# Patient Record
Sex: Female | Born: 1935 | Race: White | Hispanic: No | Smoking: Former smoker
Health system: Southern US, Community
[De-identification: ages and names within clinical notes are randomized; demographics above are authoritative.]

## PROBLEM LIST (undated history)

## (undated) DIAGNOSIS — E559 Vitamin D deficiency, unspecified: Secondary | ICD-10-CM

## (undated) DIAGNOSIS — I509 Heart failure, unspecified: Secondary | ICD-10-CM

## (undated) DIAGNOSIS — I10 Essential (primary) hypertension: Secondary | ICD-10-CM

## (undated) DIAGNOSIS — R609 Edema, unspecified: Secondary | ICD-10-CM

## (undated) DIAGNOSIS — N3281 Overactive bladder: Secondary | ICD-10-CM

## (undated) DIAGNOSIS — E119 Type 2 diabetes mellitus without complications: Secondary | ICD-10-CM

## (undated) DIAGNOSIS — M810 Age-related osteoporosis without current pathological fracture: Secondary | ICD-10-CM

## (undated) DIAGNOSIS — F0390 Unspecified dementia without behavioral disturbance: Secondary | ICD-10-CM

## (undated) DIAGNOSIS — Z9289 Personal history of other medical treatment: Secondary | ICD-10-CM

## (undated) DIAGNOSIS — E785 Hyperlipidemia, unspecified: Secondary | ICD-10-CM

## (undated) HISTORY — DX: Hyperlipidemia, unspecified: E78.5

## (undated) HISTORY — DX: Overactive bladder: N32.81

## (undated) HISTORY — DX: Essential (primary) hypertension: I10

## (undated) HISTORY — DX: Personal history of other medical treatment: Z92.89

## (undated) HISTORY — DX: Vitamin D deficiency, unspecified: E55.9

## (undated) HISTORY — DX: Age-related osteoporosis without current pathological fracture: M81.0

## (undated) HISTORY — DX: Edema, unspecified: R60.9

---

## 1973-09-30 HISTORY — PX: ABDOMINAL HYSTERECTOMY: SHX81

## 1998-08-29 ENCOUNTER — Ambulatory Visit (HOSPITAL_COMMUNITY): Admission: RE | Admit: 1998-08-29 | Discharge: 1998-08-29 | Payer: Self-pay | Admitting: Obstetrics & Gynecology

## 2000-01-28 ENCOUNTER — Encounter: Payer: Self-pay | Admitting: Family Medicine

## 2000-01-28 ENCOUNTER — Encounter: Admission: RE | Admit: 2000-01-28 | Discharge: 2000-01-28 | Payer: Self-pay | Admitting: Family Medicine

## 2001-03-03 ENCOUNTER — Encounter: Admission: RE | Admit: 2001-03-03 | Discharge: 2001-03-03 | Payer: Self-pay | Admitting: Family Medicine

## 2001-03-03 ENCOUNTER — Encounter: Payer: Self-pay | Admitting: Family Medicine

## 2001-03-24 ENCOUNTER — Other Ambulatory Visit: Admission: RE | Admit: 2001-03-24 | Discharge: 2001-03-24 | Payer: Self-pay | Admitting: Family Medicine

## 2002-04-06 ENCOUNTER — Other Ambulatory Visit: Admission: RE | Admit: 2002-04-06 | Discharge: 2002-04-06 | Payer: Self-pay | Admitting: Family Medicine

## 2002-10-12 ENCOUNTER — Encounter: Admission: RE | Admit: 2002-10-12 | Discharge: 2002-10-12 | Payer: Self-pay | Admitting: Family Medicine

## 2002-10-12 ENCOUNTER — Encounter: Payer: Self-pay | Admitting: Family Medicine

## 2003-04-28 ENCOUNTER — Other Ambulatory Visit: Admission: RE | Admit: 2003-04-28 | Discharge: 2003-04-28 | Payer: Self-pay | Admitting: Family Medicine

## 2003-05-02 ENCOUNTER — Encounter: Payer: Self-pay | Admitting: Family Medicine

## 2003-05-02 ENCOUNTER — Encounter: Admission: RE | Admit: 2003-05-02 | Discharge: 2003-05-02 | Payer: Self-pay | Admitting: Family Medicine

## 2004-05-07 ENCOUNTER — Ambulatory Visit (HOSPITAL_COMMUNITY): Admission: RE | Admit: 2004-05-07 | Discharge: 2004-05-07 | Payer: Self-pay | Admitting: Family Medicine

## 2005-07-19 ENCOUNTER — Ambulatory Visit (HOSPITAL_COMMUNITY): Admission: RE | Admit: 2005-07-19 | Discharge: 2005-07-19 | Payer: Self-pay | Admitting: Family Medicine

## 2006-05-15 ENCOUNTER — Encounter: Admission: RE | Admit: 2006-05-15 | Discharge: 2006-05-15 | Payer: Self-pay | Admitting: Family Medicine

## 2006-05-26 ENCOUNTER — Encounter: Admission: RE | Admit: 2006-05-26 | Discharge: 2006-05-26 | Payer: Self-pay | Admitting: Family Medicine

## 2006-06-17 ENCOUNTER — Encounter: Admission: RE | Admit: 2006-06-17 | Discharge: 2006-06-17 | Payer: Self-pay | Admitting: Family Medicine

## 2006-07-07 ENCOUNTER — Encounter: Admission: RE | Admit: 2006-07-07 | Discharge: 2006-07-07 | Payer: Self-pay | Admitting: Family Medicine

## 2006-07-25 ENCOUNTER — Ambulatory Visit (HOSPITAL_COMMUNITY): Admission: RE | Admit: 2006-07-25 | Discharge: 2006-07-25 | Payer: Self-pay | Admitting: Family Medicine

## 2006-08-12 ENCOUNTER — Encounter: Admission: RE | Admit: 2006-08-12 | Discharge: 2006-08-12 | Payer: Self-pay | Admitting: Family Medicine

## 2007-08-04 ENCOUNTER — Ambulatory Visit (HOSPITAL_COMMUNITY): Admission: RE | Admit: 2007-08-04 | Discharge: 2007-08-04 | Payer: Self-pay | Admitting: Family Medicine

## 2007-08-21 ENCOUNTER — Ambulatory Visit: Payer: Self-pay | Admitting: Internal Medicine

## 2007-09-04 ENCOUNTER — Encounter: Payer: Self-pay | Admitting: Internal Medicine

## 2007-09-04 ENCOUNTER — Ambulatory Visit: Payer: Self-pay | Admitting: Internal Medicine

## 2008-08-24 ENCOUNTER — Ambulatory Visit: Payer: Self-pay

## 2008-08-24 ENCOUNTER — Encounter: Payer: Self-pay | Admitting: Family Medicine

## 2008-08-31 ENCOUNTER — Ambulatory Visit (HOSPITAL_COMMUNITY): Admission: RE | Admit: 2008-08-31 | Discharge: 2008-08-31 | Payer: Self-pay | Admitting: Family Medicine

## 2008-09-30 DIAGNOSIS — M810 Age-related osteoporosis without current pathological fracture: Secondary | ICD-10-CM

## 2008-09-30 HISTORY — DX: Age-related osteoporosis without current pathological fracture: M81.0

## 2009-09-05 ENCOUNTER — Ambulatory Visit (HOSPITAL_COMMUNITY): Admission: RE | Admit: 2009-09-05 | Discharge: 2009-09-05 | Payer: Self-pay | Admitting: Family Medicine

## 2010-10-23 ENCOUNTER — Ambulatory Visit (HOSPITAL_COMMUNITY)
Admission: RE | Admit: 2010-10-23 | Discharge: 2010-10-23 | Payer: Self-pay | Source: Home / Self Care | Attending: Family Medicine | Admitting: Family Medicine

## 2011-11-15 ENCOUNTER — Other Ambulatory Visit (HOSPITAL_COMMUNITY): Payer: Self-pay | Admitting: Family Medicine

## 2011-11-15 DIAGNOSIS — Z1231 Encounter for screening mammogram for malignant neoplasm of breast: Secondary | ICD-10-CM

## 2011-12-13 ENCOUNTER — Ambulatory Visit (HOSPITAL_COMMUNITY)
Admission: RE | Admit: 2011-12-13 | Discharge: 2011-12-13 | Disposition: A | Discharge status: Home / Self Care | Payer: Medicare Other | Source: Ambulatory Visit | Attending: Family Medicine | Admitting: Family Medicine

## 2011-12-13 DIAGNOSIS — Z1231 Encounter for screening mammogram for malignant neoplasm of breast: Secondary | ICD-10-CM | POA: Insufficient documentation

## 2012-12-25 DIAGNOSIS — Z9289 Personal history of other medical treatment: Secondary | ICD-10-CM

## 2012-12-25 HISTORY — DX: Personal history of other medical treatment: Z92.89

## 2013-05-28 ENCOUNTER — Encounter: Payer: Self-pay | Admitting: Internal Medicine

## 2013-09-01 ENCOUNTER — Emergency Department (HOSPITAL_COMMUNITY): Payer: 59

## 2013-09-01 ENCOUNTER — Encounter (HOSPITAL_COMMUNITY): Payer: Self-pay | Admitting: Emergency Medicine

## 2013-09-01 ENCOUNTER — Emergency Department (HOSPITAL_COMMUNITY)
Admission: EM | Admit: 2013-09-01 | Discharge: 2013-09-01 | Disposition: A | Discharge status: Home / Self Care | Payer: 59 | Attending: Emergency Medicine | Admitting: Emergency Medicine

## 2013-09-01 DIAGNOSIS — Z880 Allergy status to penicillin: Secondary | ICD-10-CM | POA: Insufficient documentation

## 2013-09-01 DIAGNOSIS — S92301A Fracture of unspecified metatarsal bone(s), right foot, initial encounter for closed fracture: Secondary | ICD-10-CM

## 2013-09-01 DIAGNOSIS — R011 Cardiac murmur, unspecified: Secondary | ICD-10-CM | POA: Insufficient documentation

## 2013-09-01 DIAGNOSIS — S0003XA Contusion of scalp, initial encounter: Secondary | ICD-10-CM | POA: Insufficient documentation

## 2013-09-01 DIAGNOSIS — W19XXXA Unspecified fall, initial encounter: Secondary | ICD-10-CM

## 2013-09-01 DIAGNOSIS — S01501A Unspecified open wound of lip, initial encounter: Secondary | ICD-10-CM | POA: Insufficient documentation

## 2013-09-01 DIAGNOSIS — S0081XA Abrasion of other part of head, initial encounter: Secondary | ICD-10-CM

## 2013-09-01 DIAGNOSIS — W1809XA Striking against other object with subsequent fall, initial encounter: Secondary | ICD-10-CM | POA: Insufficient documentation

## 2013-09-01 DIAGNOSIS — S62616A Displaced fracture of proximal phalanx of right little finger, initial encounter for closed fracture: Secondary | ICD-10-CM

## 2013-09-01 DIAGNOSIS — S92309A Fracture of unspecified metatarsal bone(s), unspecified foot, initial encounter for closed fracture: Secondary | ICD-10-CM | POA: Insufficient documentation

## 2013-09-01 DIAGNOSIS — E119 Type 2 diabetes mellitus without complications: Secondary | ICD-10-CM | POA: Insufficient documentation

## 2013-09-01 DIAGNOSIS — Z79899 Other long term (current) drug therapy: Secondary | ICD-10-CM | POA: Insufficient documentation

## 2013-09-01 DIAGNOSIS — IMO0002 Reserved for concepts with insufficient information to code with codable children: Secondary | ICD-10-CM | POA: Insufficient documentation

## 2013-09-01 DIAGNOSIS — Y939 Activity, unspecified: Secondary | ICD-10-CM | POA: Insufficient documentation

## 2013-09-01 DIAGNOSIS — S8990XA Unspecified injury of unspecified lower leg, initial encounter: Secondary | ICD-10-CM | POA: Insufficient documentation

## 2013-09-01 DIAGNOSIS — Y9289 Other specified places as the place of occurrence of the external cause: Secondary | ICD-10-CM | POA: Insufficient documentation

## 2013-09-01 HISTORY — DX: Type 2 diabetes mellitus without complications: E11.9

## 2013-09-01 MED ORDER — ONDANSETRON 8 MG PO TBDP
8.0000 mg | ORAL_TABLET | Freq: Once | ORAL | Status: AC
  Administered 2013-09-01: 8 mg via ORAL
  Filled 2013-09-01: qty 1

## 2013-09-01 MED ORDER — LIDOCAINE HCL (PF) 1 % IJ SOLN
5.0000 mL | Freq: Once | INTRAMUSCULAR | Status: AC
  Administered 2013-09-01: 5 mL via INTRADERMAL
  Filled 2013-09-01: qty 5

## 2013-09-01 MED ORDER — HYDROCODONE-ACETAMINOPHEN 5-325 MG PO TABS: 0.5000 | ORAL_TABLET | ORAL | Status: DC | PRN

## 2013-09-01 NOTE — ED Notes (Signed)
Patient transported to CT 

## 2013-09-01 NOTE — ED Notes (Signed)
Unable to get pt in the car, [facility] called.

## 2013-09-01 NOTE — ED Provider Notes (Signed)
CSN: [id number]     Arrival date & time 09/01/13  1418 History   First MD Initiated Contact with Patient 09/01/13 1505     Chief Complaint  Patient presents with  . Fall  . Hand Injury   (Consider location/radiation/quality/duration/timing/severity/associated sxs/prior Treatment) Patient is a 79 y.o. female presenting with fall and hand injury. The history is provided by the patient and medical records. No language interpreter was used.  Fall Pertinent negatives include no abdominal pain, arthralgias, chest pain, chills, coughing, fever, headaches, joint swelling, nausea, neck pain, numbness, rash, vomiting or weakness.  Hand Injury Associated symptoms: no back pain, no fever and no neck pain     DARNEISHA WINDHORST is a 79 y.o. female  with a hx of DM (on Metformin) presents to the Emergency Department complaining of acute persistent pain to the right hand beginning immediately prior to a fall which occurred in the parking lot of [facility] just PTA.  Pt reports she tripped and fell, putting her right hand down first and then hitting her head, but denies LOC.  She denies use of anticoagulants or ASA . Associated symptoms include pain in the right hand, right knee and abrasions to the lips and face.  Nothing makes it better and palpation and movement makes it worse.  She is unsure if she ambulated after the fall as "there were so many people around me."  Pt denies fever, chills, headache, neck pain, back pain, CP, SOB, abd pain, N/V/D, weakness, numbness, dizziness, syncope, dysuria.     Past Medical History  Diagnosis Date  . Diabetes mellitus without complication    History reviewed. No pertinent past surgical history. No family history on file. History  Substance Use Topics  . Smoking status: Not on file  . Smokeless tobacco: Not on file  . Alcohol Use: Not on file   OB History   Grav Para Term Preterm Abortions TAB SAB Ect Mult Living                 Review of Systems   Constitutional: Negative for fever and chills.  HENT: Negative for dental problem, facial swelling and nosebleeds.   Eyes: Negative for visual disturbance.  Respiratory: Negative for cough, chest tightness, shortness of breath, wheezing and stridor.   Cardiovascular: Negative for chest pain.  Gastrointestinal: Negative for nausea, vomiting and abdominal pain.  Genitourinary: Negative for dysuria, hematuria and flank pain.  Musculoskeletal: Negative for arthralgias, back pain, gait problem, joint swelling, neck pain and neck stiffness.  Skin: Negative for rash and wound.  Neurological: Negative for syncope, weakness, light-headedness, numbness and headaches.  Hematological: Does not bruise/bleed easily.  Psychiatric/Behavioral: The patient is not nervous/anxious.   All other systems reviewed and are negative.    Allergies  Penicillins  Home Medications   Current Outpatient Rx  Name  Route  Sig  Dispense  Refill  . amLODipine (NORVASC) 5 MG tablet   Oral   Take 5 mg by mouth daily.         Marland Kitchen atorvastatin (LIPITOR) 20 MG tablet   Oral   Take 20 mg by mouth daily at 6 PM.         . Cholecalciferol (VITAMIN D3) 2000 UNITS capsule   Oral   Take 4,000 Units by mouth daily.         . diphenhydrAMINE (BENADRYL) 25 mg capsule   Oral   Take 25 mg by mouth at bedtime as needed for sleep.         Marland Kitchen  fosinopril-hydrochlorothiazide (MONOPRIL-HCT) 10-12.5 MG per tablet   Oral   Take 1 tablet by mouth daily.         Marland Kitchen ibuprofen (ADVIL,MOTRIN) 200 MG tablet   Oral   Take 400 mg by mouth every 8 (eight) hours as needed for headache or moderate pain.         . Pioglitazone HCl-Metformin HCl (ACTOPLUS MET XR) 30-1000 MG TB24   Oral   Take 1 tablet by mouth daily.         . sodium chloride (OCEAN) 0.65 % nasal spray   Nasal   Place 1 spray into the nose at bedtime as needed for congestion.         Marland Kitchen HYDROcodone-acetaminophen (NORCO/VICODIN) 5-325 MG per tablet    Oral   Take 0.5-1 tablets by mouth every 4 (four) hours as needed for severe pain. untreated with ibuprofen   5 tablet   0    BP 196/57  Pulse 76  Temp(Src) 98.1 F (36.7 C) (Oral)  Resp 20  SpO2 100% Physical Exam  Nursing note and vitals reviewed. Constitutional: She is oriented to person, place, and time. She appears well-developed and well-nourished. No distress.  HENT:  Head: Normocephalic. Head is with abrasion and with contusion.    Nose: Nose normal.  Mouth/Throat: Uvula is midline, oropharynx is clear and moist and mucous membranes are normal.  Abrasions over the right eye, at the bridge of the nose and over the top and bottom lip Small laceration to the right medial top lip on the mucosal surface, hemostasis achieved and not full thickness Pt wears dentures and they are intact   Eyes: Conjunctivae and EOM are normal. Pupils are equal, round, and reactive to light.  Neck: Normal range of motion and full passive range of motion without pain. Neck supple. No spinous process tenderness and no muscular tenderness present. No rigidity. Normal range of motion present.  Full ROM without pain No midline or paraspinal tenderness, no step-offs or deformities  Cardiovascular: Normal rate, regular rhythm and intact distal pulses.   Murmur heard. Pulses:      Radial pulses are 2+ on the right side, and 2+ on the left side.       Dorsalis pedis pulses are 2+ on the right side, and 2+ on the left side.       Posterior tibial pulses are 2+ on the right side, and 2+ on the left side.  Pulmonary/Chest: Effort normal and breath sounds normal. No accessory muscle usage. No respiratory distress. She has no decreased breath sounds. She has no wheezes. She has no rhonchi. She has no rales. She exhibits no tenderness and no bony tenderness.  No ecchymosis, flail segment or crepitus  Abdominal: Soft. Normal appearance and bowel sounds are normal. There is no tenderness. There is no rigidity, no  guarding and no CVA tenderness.  No ecchymosis Soft and nontender  Musculoskeletal: Normal range of motion. She exhibits no edema and no tenderness.       Right knee: She exhibits normal range of motion, no swelling, no effusion, no ecchymosis, no deformity, no laceration, no erythema, normal alignment and normal patellar mobility. No tenderness found. No medial joint line tenderness noted.       Thoracic back: She exhibits normal range of motion.       Lumbar back: She exhibits normal range of motion.       Hands: Full range of motion of the T-spine and L-spine No tenderness to palpation  of the spinous processes of the T-spine or L-spine No tenderness to palpation of the paraspinous muscles of the L-spine or T-spine  Pain with ROM of the right knee, but no pain with rest; no obvious deformity  Swelling, ecchymosis and deformity to the right little finger with ecchymosis extending beyond the MCP joint down the 5th metacarpal - no palpable deformity of the 5th metacarpal  Lymphadenopathy:    She has no cervical adenopathy.  Neurological: She is alert and oriented to person, place, and time. No cranial nerve deficit. GCS eye subscore is 4. GCS verbal subscore is 5. GCS motor subscore is 6.  Reflex Scores:      Tricep reflexes are 2+ on the right side and 2+ on the left side.      Bicep reflexes are 2+ on the right side and 2+ on the left side.      Brachioradialis reflexes are 2+ on the right side and 2+ on the left side.      Patellar reflexes are 2+ on the right side and 2+ on the left side.      Achilles reflexes are 2+ on the right side and 2+ on the left side. Speech is clear and goal oriented, follows commands Normal strength in upper and lower extremities bilaterally including dorsiflexion and plantar flexion, strong and equal grip strength Sensation normal to light and sharp touch Moves extremities without ataxia, coordination intact  Skin: Skin is warm and dry. No rash noted. She  is not diaphoretic. No erythema.  Psychiatric: She has a normal mood and affect.    ED Course  Reduction of fracture Date/Time: 09/01/2013 5:59 PM Performed by: Dierdre Forth Authorized by: Dierdre Forth Consent: Verbal consent obtained. Risks and benefits: risks, benefits and alternatives were discussed Consent given by: patient and parent Patient understanding: patient states understanding of the procedure being performed Patient consent: the patient's understanding of the procedure matches consent given Procedure consent: procedure consent matches procedure scheduled Relevant documents: relevant documents present and verified Site marked: the operative site was marked Imaging studies: imaging studies available Required items: required blood products, implants, devices, and special equipment available Patient identity confirmed: verbally with patient and arm band Time out: Immediately prior to procedure a "time out" was called to verify the correct patient, procedure, equipment, support staff and site/side marked as required. Preparation: Patient was prepped and draped in the usual sterile fashion. Local anesthesia used: yes Anesthesia: digital block Local anesthetic: lidocaine 1% without epinephrine Anesthetic total: 5 ml Patient tolerance: Patient tolerated the procedure well with no immediate complications. Comments: Reduction of 5th proximal phalanx with immediate placement of finger splint and ulnar gutter.     (including critical care time) Labs Review Labs Reviewed - No data to display Imaging Review Ct Head Wo Contrast  09/01/2013   CLINICAL DATA:  79 year old female with head and neck injury following fall.  EXAM: CT HEAD WITHOUT CONTRAST  CT CERVICAL SPINE WITHOUT CONTRAST  TECHNIQUE: Multidetector CT imaging of the head and cervical spine was performed following the standard protocol without intravenous contrast. Multiplanar CT image reconstructions of the  cervical spine were also generated.  COMPARISON:  None.  FINDINGS: CT HEAD FINDINGS  Moderate to severe periventricular white matter disease probably represents chronic small-vessel ischemic changes.  No acute intracranial abnormalities are identified, including mass lesion or mass effect, hydrocephalus, extra-axial fluid collection, midline shift, hemorrhage, or acute infarction. The visualized bony calvarium is unremarkable.  CT CERVICAL SPINE FINDINGS  A mild  cervical scoliosis is noted.  There is no evidence of acute fracture, subluxation or prevertebral soft tissue swelling.  Mild multilevel degenerative disc disease and spondylosis identified, greatest at C5-6 and C6-7 and contributing to mild to moderate central spinal and bony foraminal narrowing bilaterally at these levels.  No suspicious focal bony lesions are present.  No soft tissue abnormalities are identified.  IMPRESSION: No evidence of acute intracranial abnormality.  No static evidence of acute injury to the cervical spine.  Moderate to severe white matter disease -likely chronic small-vessel white matter ischemic changes.  Degenerative changes within the cervical spine as described above.   Electronically Signed   By: Laveda Abbe M.D.   On: 09/01/2013 16:55   Ct Cervical Spine Wo Contrast  09/01/2013   CLINICAL DATA:  79 year old female with head and neck injury following fall.  EXAM: CT HEAD WITHOUT CONTRAST  CT CERVICAL SPINE WITHOUT CONTRAST  TECHNIQUE: Multidetector CT imaging of the head and cervical spine was performed following the standard protocol without intravenous contrast. Multiplanar CT image reconstructions of the cervical spine were also generated.  COMPARISON:  None.  FINDINGS: CT HEAD FINDINGS  Moderate to severe periventricular white matter disease probably represents chronic small-vessel ischemic changes.  No acute intracranial abnormalities are identified, including mass lesion or mass effect, hydrocephalus, extra-axial fluid  collection, midline shift, hemorrhage, or acute infarction. The visualized bony calvarium is unremarkable.  CT CERVICAL SPINE FINDINGS  A mild cervical scoliosis is noted.  There is no evidence of acute fracture, subluxation or prevertebral soft tissue swelling.  Mild multilevel degenerative disc disease and spondylosis identified, greatest at C5-6 and C6-7 and contributing to mild to moderate central spinal and bony foraminal narrowing bilaterally at these levels.  No suspicious focal bony lesions are present.  No soft tissue abnormalities are identified.  IMPRESSION: No evidence of acute intracranial abnormality.  No static evidence of acute injury to the cervical spine.  Moderate to severe white matter disease -likely chronic small-vessel white matter ischemic changes.  Degenerative changes within the cervical spine as described above.   Electronically Signed   By: Laveda Abbe M.D.   On: 09/01/2013 16:55   Dg Knee Complete 4 Views Right  09/01/2013   CLINICAL DATA:  Fall.  Anterior knee pain.  Diabetes.  EXAM: RIGHT KNEE - COMPLETE 4+ VIEW  COMPARISON:  None.  FINDINGS: Bony demineralization observed. Oblique lucency extending across the patella is most compatible with a oblique patellar fracture. Moderate knee effusion.  IMPRESSION: 1. Subtle oblique patellar fracture. 2. Moderate knee effusion. 3. Bony demineralization.   Electronically Signed   By: Herbie Baltimore M.D.   On: 09/01/2013 16:47   Dg Hand Complete Right  09/01/2013   CLINICAL DATA:  Fall with 5th finger injury.  EXAM: RIGHT HAND - COMPLETE 3+ VIEW  COMPARISON:  None.  FINDINGS: There is diffuse decreased bone density. There are mild degenerative changes over the radiocarpal joint and carpal bones. There is a displaced transverse fracture through the base of the 5th proximal phalanx with dorsal angulation of the distal fragment. Possible 2nd nondisplaced transverse fracture of the base of the 5th metacarpal.  IMPRESSION: Displaced transverse  fracture of the base of the 5th proximal phalanx. Possible nondisplaced transverse fracture involving the base of the 5th metacarpal.   Electronically Signed   By: Elberta Fortis M.D.   On: 09/01/2013 16:34    EKG Interpretation   None       MDM   1. Fall  from standing, initial encounter   2. Displaced fracture of proximal phalanx of right little finger, closed, initial encounter   3. Metatarsal fracture, right, closed, initial encounter   4. Abrasion of face, initial encounter      TAIS KOESTNER presents with mechanical fall.  Will obtain imaging.  Pt is alert, oriented, nontoxic and nonseptic appearing.    5:27 PM CT head and cervical spine without acute changes; degenerative changes in the C-spine noted.  Right hand with displaced transverse fracture at the base of the fifth proximal phalanx and possible nondisplaced transverse fracture at the base of the fifth metacarpal. Right knee with subtle oblique patellar fracture.  I personally reviewed the imaging tests through PACS system.  I reviewed available ER/hospitalization records through the EMR.    Will provide digital block with finger reduction and knee immobilizer on the right leg. Discussed with patient and daughter who does not believe that a wheelchair is a feasible option.  Patient's daughter does report that she has a cane at home that she has used before and they would be able to obtain a walker as needed. I suggested any help to reduce the amount of weightbearing right leg will be helpful but we should avoid things that might cause instability.  6:00 PM Pt with 5th metatarsal fracture reduction without complication and with improved alignment via visualization and palpation.  Pt tolerated procedure without difficulty.  Ulnar gutter placed and patient instructed to followup with [facility] orthoped[facility].  Pt has been given a small Rx for Vicoden.  Pt and daughter have been advised this medication increases the patient's fall  risk and should only be taken for severe pain and when not alone.  Both patient and daughter are able to verbalize my concerns.    It has been determined that no acute conditions requiring further emergency intervention are present at this time. The patient/guardian have been advised of the diagnosis and plan. We have discussed signs and symptoms that warrant return to the ED, such as changes or worsening in symptoms.   Patient/guardian has voiced understanding and agreed to follow-up with the PCP or specialist.  The patient was discussed with and seen by Dr. Adriana Simas who agrees with the treatment plan.    Dahlia Client Oktober Glazer, PA-C 09/01/13 2025

## 2013-09-01 NOTE — ED Notes (Signed)
Per EMS-pt had a fall at [facility] , tripped over stairs. Pain to right hand and right knee, scrapes and bruises to face and lip. Denies LOC, dizziness.

## 2013-09-01 NOTE — Progress Notes (Signed)
Patient confirms her pcp is located at [facility] on [address].  Patient cannot remember the name of her physician at this time.

## 2013-09-13 NOTE — ED Provider Notes (Signed)
Medical screening examination/treatment/procedure(s) were conducted as a shared visit with non-physician practitioner(s) and myself.  I personally evaluated the patient during the encounter.  EKG Interpretation   None      X-ray results right hand well documented. Neurovascular intact. Fracture dislocation of proximal phalanx of right little finger relocated by physician assistant  Donnetta Hutching, MD 09/13/13 0800

## 2014-11-29 ENCOUNTER — Other Ambulatory Visit (HOSPITAL_COMMUNITY): Payer: Self-pay | Admitting: Family Medicine

## 2014-11-29 DIAGNOSIS — Z13820 Encounter for screening for osteoporosis: Secondary | ICD-10-CM

## 2014-12-07 ENCOUNTER — Ambulatory Visit (HOSPITAL_COMMUNITY): Payer: 59

## 2014-12-07 ENCOUNTER — Ambulatory Visit (HOSPITAL_COMMUNITY)
Admission: RE | Admit: 2014-12-07 | Discharge: 2014-12-07 | Disposition: A | Discharge status: Home / Self Care | Payer: Medicare PPO | Source: Ambulatory Visit | Attending: Family Medicine | Admitting: Family Medicine

## 2014-12-07 DIAGNOSIS — Z780 Asymptomatic menopausal state: Secondary | ICD-10-CM | POA: Diagnosis not present

## 2014-12-07 DIAGNOSIS — Z13820 Encounter for screening for osteoporosis: Secondary | ICD-10-CM | POA: Insufficient documentation

## 2015-01-13 DIAGNOSIS — E119 Type 2 diabetes mellitus without complications: Secondary | ICD-10-CM | POA: Diagnosis not present

## 2015-01-13 DIAGNOSIS — Z961 Presence of intraocular lens: Secondary | ICD-10-CM | POA: Diagnosis not present

## 2015-01-13 DIAGNOSIS — H52203 Unspecified astigmatism, bilateral: Secondary | ICD-10-CM | POA: Diagnosis not present

## 2015-02-28 DIAGNOSIS — C44319 Basal cell carcinoma of skin of other parts of face: Secondary | ICD-10-CM | POA: Diagnosis not present

## 2015-03-15 DIAGNOSIS — Z85828 Personal history of other malignant neoplasm of skin: Secondary | ICD-10-CM | POA: Diagnosis not present

## 2015-03-15 DIAGNOSIS — C44319 Basal cell carcinoma of skin of other parts of face: Secondary | ICD-10-CM | POA: Diagnosis not present

## 2015-04-07 DIAGNOSIS — Z720 Tobacco use: Secondary | ICD-10-CM | POA: Diagnosis not present

## 2015-04-07 DIAGNOSIS — F17200 Nicotine dependence, unspecified, uncomplicated: Secondary | ICD-10-CM | POA: Diagnosis not present

## 2015-04-07 DIAGNOSIS — M810 Age-related osteoporosis without current pathological fracture: Secondary | ICD-10-CM | POA: Diagnosis not present

## 2015-04-27 ENCOUNTER — Ambulatory Visit: Payer: Medicare Other | Admitting: Physical Therapy

## 2015-05-01 ENCOUNTER — Encounter: Payer: Self-pay | Admitting: Physical Therapy

## 2015-05-01 ENCOUNTER — Ambulatory Visit: Payer: Medicare PPO | Attending: Internal Medicine | Admitting: Physical Therapy

## 2015-05-01 DIAGNOSIS — R531 Weakness: Secondary | ICD-10-CM | POA: Insufficient documentation

## 2015-05-01 NOTE — Patient Instructions (Signed)
DO's and DON'T's   Avoid and/or Minimize positions of forward bending ( flexion)  Side bending and rotation of the trunk  Especially when movements occur together   When your back aches:   Don't sit down   Lie down on your back with a small pillow under your head and one under your knees or as outlined by our therapist. Or, lie in the 90/90 position ( on the floor with your feet and legs on the sofa with knees and hips bent to 90 degrees)  Tying or putting on your shoes:   Don't bend over to tie your shoes or put on socks.  Instead, bring one foot up, cross it over the opposite knee and bend forward (hinge) at the hips to so the task.  Keep your back straight.  If you cannot do this safely, then you need to use long handled assistive devices such as a shoehorn and sock puller.  Exercising:  Don't engage in ballistic types of exercise routines such as high-impact aerobics or jumping rope  Don't do exercises in the gym that bring you forward (abdominal crunches, sit-ups, touching your  toes, knee-to-chest, straight leg raising.)  Follow a regular exercise program that includes a variety of different weight-bearing activities, such as low-impact aerobics, T' ai chi or walking as your physical therapist advises  Do exercises that emphasize return to normal body alignment and strengthening of the muscles that keep your back straight, as outlined in this program or by your therapist  Household tasks:  Don't reach unnecessarily or twist your trunk when mopping, sweeping, vacuuming, raking, making beds, weeding gardens, getting objects ou of cupboards, etc.  Keep your broom, mop, vacuum, or rake close to you and mover your whole body as you move them. Walk over to the area on which you are working. Arrange kitchen, bathroom, and bedroom shelves so that frequently used items may be reached without excessive bending, twisting, and reaching.  Use a  sturdy stool if necessary.  Don't bend from the waist to pick up something up  Off the floor, out of the trunk of your car, or to brush your teeth, wash your face, etc.   Bend at the knees, keeping back straight as possible. Use a reacher if necessary.   Prevention of fracture is the so-called "BOTTOm -Line" in the management of OSTEOPOROSIS. Do not take unnecessary chances in movement. Once a compression fracture occurs, the process is very difficult to control; one fracture is frequently followed by many more.     Osteoporosis   What is Osteoporosis?  - A silent disease in which the skeleton is weakened by decreased bone density. - Characterized by low bone mass, deterioration of bone, and increased risk of fracture postmenopausal (primary) or the result of an identifiable condition/event (secondary) - Commonly found in the wrists, spine, and hips; these are high-risk stress areas and very susceptible to fractures.  The Facts: - There are 1.5 million fractures/year o 500,000 spine; 250,000 hip with over 60,000 nursing home admissions secondary to hip fracture; and 200,000 wrist - After hip fracture, only 50% of people able to walk independently prior to the fracture return to independent ambulation. - Bone mass: Peaks at age 20-30, and begins declining at age 40-50.   Osteoporosis is defined by the World Health Organization (WHO) as:  NOF/WHO Criteria for Interpreting Results of Bone Density Assessment  Results Diagnosis  Within 1 standard deviation (SD) of young adult mean Normal  Between 1 and -2.5   SD below mean, repeat in 2 years Low bone mass (osteopenia)  Greater than -2.5 SD below mean Osteoporosis  Greater than -2.5 SD below mean and one or more fragility fractures exist Severe Osteoporosis  *Results can be affected by positioning of the body in the DEXA scan, presence of current or old fractures, arthritis, extraneous calcifications.    Osteoporosis is not just a  women's disease!  - 30-40% of women will develop osteoporosis - 5-15% of males will develop osteoporosis   What are the risk factors?  1. Female 2. Thin, small frame 3. Caucasian, Asian race 4. Early menopause (<45 years old)/amenorrhea/delayed puberty 5. Old age 6. Family history (fractures, stooped posture)\ 7. Low calcium diet 8. Sedentary lifestyle 9. Alcohol, Caffeine, Smoking 10. Malnutrition, GI Disease 11. Prolonged use of Glucocorticoids (Prednisone), Meds to treat asthma, arthritis, cancers, thyroid, and anti-seizure meds.  How do I know for sure?  Get a BONE DENSITY TEST!  This measures bone loss and it's painless, non-invasive, and only takes 5-10 minutes!  What can I do about it?  ? Decrease your risk factors (alcohol, caffeine, smoking) ? Helpful medications (see next page) ? Adequate Calcium and Vitamin D intake ? Get active! o Proper posture - Sit and stand tall! No slouching or twisting o Weight-Bearing Exercise - walking, stair climbing, elliptical; NO jogging or high-impact exercise. o Resistive Exercise - Cybex weight equipment, Nautilus, dumbbells, therabands  **Be sure to maintain proper alignment when lifting any weight!!  **When using equipment, avoid abdominal exercises which involve "crunching" or curling or twisting the trunk, biceps machines, cross-country machines, moving handlebars, or ANY MACHINE WITH ROTATION OR FORWARD BENDING!!!           Approved Pharmacologic Management of Osteoporosis  Agent Approved for prevention Approved for treatment BMD increased spine/hip Fracture reduction  Estrogen/Hormone Therapy (Estrace, Estratab, Ogen, Premarin, Vivell, Prempro, Femhert, Orthoest) Yes Yes 3-6% 35% spine and hip  Bisphosphonates  (Fosamax, Actonel, Boniva) Yes Yes 3-8% 35-50% spine and non-spine  Calcitonin (Miacalcin, Calcimar, Fortical) No Yes 0-3% None stated  Raloxifene (Evista) Yes Yes 2-3% 30-55%  Parathyroid  Hormone (Forteo) No Yes, only in those at high risk for fracture None stated 53-65%     Recommended Daily Calcium Intakes   Population Group NIH/NOF* (mg elemental calcium)  Children 1-10 years 800-1200  Children 11-24 years 1200-1500  Men and Women 25-64 years At least 1200  Pregnant/Lactating At least 1200  Postmenopausal women with hormone replacement therapy At least 1200  Postmenopausal women without   hormone replacement therapy At least 1200  Men and women 65 + At least 1200  *In 1987, 1990, 1994, and 2000, the NIH held consensus conferences on osteoporosis and calcium.  This column shows the most recent recommendations regarding calcium intak for preventing and managing osteoporosis.          Calcium Content of Selected Foods  Dairy Foods Calcium Content (mg) Non-Dairy Foods Calcium Content (mg)  Buttermilk, 1 cup 300 Calcium-fortified juice, 1 cup 300  Milk, 1 cup 300 Salmon, canned with Bones, 2 oz 100  Lactaid milk, 1 cup 300-500 Oysters, raw 13-19 medium 226  Soy milk, 1 cup 200-300 Sardines, canned with bones, 3 oz 372  Yogurt (plain, lowfat) 1 cup 250-300 Shrimp, canned 3 oz 98  Frozen yogurt (fruit) 1 cup 200-600 Collard greens, cooked 1 cup 357  Cheddar, mozzarella, or [place] cheese, 1oz 205 Broccoli, cooked 1 cup 78  Cottage cheese (lowfat) 4 oz 200 Soybeans, cooked 1   cup 131  Part-skim ricotta cheese, 4oz 335 Tofu, 4oz* *  Vanilla ice cream, 1 cup 120-300    *Calcium content of tofu varies depending on processing method; check nutritional label on package for precise calcium content.     Suggested Guidelines for Calcium Supplement Use:  ? Calcium is absorbed most efficiently if taken in small amounts throughout the day.  Always divide the daily dose into smaller amounts if the total daily dose is 500mg or more per day.  The body cannot use more than 500mg Calcium at any one time. ? The use of manufactured supplements is encouraged.   Calcium as bone meal or dolomite may contain lead or other heavy metals as contaminants. ? Calcium supplements should not be taken with high fiber meals or with bulk forming laxatives. ? If calcium carbonate is used as the supplement form, it should be taken with meals to assure that stomach acid production is present to facilitate optimal dissolution and absorption of calcium.  This is important if atrophic gastritis with hypo- or achlorhydria is present, which it is in 20-50% of older individuals. ? It is important to drink plenty of fluids while using the supplement to help reduce problems with side effects like constipation or bloating.  If these symptoms become a problem, switching to another form of supplement may be the answer. ? Another alternative is calcium-fortified foods, including fruit juices, cereals, and breads.  These foods are now marketed with added calcium and may be less likely to cause side effects. ? Those with personal or family histories of kidney stones should be monitored to assure that hypercalcuria does not occur. CALCIUM INTAKE QUIZ  Dairy products are the primary source of calcium for most people.  For a quick estimate of your daily calcium intake, complete the following steps:  1. Use the chart below to determine your daily intake of calcium from diary foods. Servings of dairy per day 1 2 3 4 5 6 7 8  Milligrams (mg) of calcium: 250 500 750 1000 1250 1500 1750 2000   2.  Enter your total daily calcium intake from dairy foods:     _____mg  3.  Add 350 mg, which is the average for all other dietary sources:                 +            350 mg  4.  The sum of your total daily calcium intake:               ______mg  5.  Enter the recommended calcium intake for your age from the chart below;         ______mg  6.  Enter your daily intake from step 4 above and subtract:                             -        _______mg  7.  The result is how much additional calcium you  need:                                          ______mg      Recommended Daily Calcium Intake  Population Calcium (mg)  Children 1-10 years 800-1200  Children 11-24 years 1200-1500  Men and women 25-64 At   least 1200  Pregnant/Lactating At least 1200  Postmenopausal women with hormone replacement therapy At least 1200  Postmenopausal women without hormone replacement therapy At least 1200  Men and women 65+ At least 1200       SAFETY TIPS FOR FALL PREVENTION   1. Remove throw rugs and make certain carpet edges are securely fastened to the floor.  2. Reduce clutter, especially in traffic areas of the home.   3. Install/maintain sturdy handrails at stairs.  4. Increase wattage of lighting in hallways, bathrooms, kitchens, stairwells, and entrances to home.  5. Use night-lights near bed, in hallways, and in bathroom to improve night safety.  6. Install safety handrails in shower, tub, and around toilet.  Bathtubs and shower stalls should have non-skid surfaces.  7. When you must reach for something high, use a safety step stool, one with wide steps and a friction surface to stand on.  A type equipped with a high handrail is preferred.  8. If a cane or other walking aid has been recommended, use it to help increase your stability.  9. Wear supportive, cushioned, low-heeled shoes.  Avoid "scuffs" (backless bedroom slippers) and high heels.  10. Avoid rushing to answer a phone, doorbell, or anything else!  A portable phone that you can take from room to room with you is a good idea for security and safety.  11. Exercise regularly and stay active!!    Resources  National Osteoporosis Foundation www.NOF.org   Exercise for Osteoporosis; A Safe and Effective Way to Build Bone Density and Muscle Strength By: Dianne Daniels, M.A.    [facility] [address], [address] [place], [place] [address] Phone # [phone] Fax [phone]  

## 2015-05-01 NOTE — Therapy (Signed)
[facility] [address] [place], [place], [address] Phone: [phone]   Fax:  [phone]  Physical Therapy Evaluation  Patient Details  Name: Rachel Rangel MRN: [id number] Date of Birth: 1933-11-16 Referring Provider:  Talmage Coin, MD  Encounter Date: 05/01/2015      PT End of Session - 05/01/15 1613    Visit Number 1   Number of Visits 10  Medicare   Date for PT Re-Evaluation 06/12/15   PT Start Time 1530   PT Stop Time 1613   PT Time Calculation (min) 43 min   Activity Tolerance Patient tolerated treatment well   Behavior During Therapy [facility] for tasks assessed/performed      Past Medical History  Diagnosis Date  . Diabetes mellitus without complication   . Benign hypertension   . Hyperlipidemia   . Vitamin D deficiency   . Overactive bladder   . Osteoporosis 2010  . History of complete eye exam 12/25/2012    Normal eye exam, no retinopathy [facility]  . Edema     Left pedal   . Vitamin D deficiency     Past Surgical History  Procedure Laterality Date  . Abdominal hysterectomy  1975    partial    There were no vitals filed for this visit.  Visit Diagnosis:  Weakness - Plan: PT plan of care cert/re-cert      Subjective Assessment - 05/01/15 1542    Subjective My doctor sent me here to learn exercises.    Patient Stated Goals learn exercises   Currently in Pain? No/denies   Multiple Pain Sites No            [facility] PT Assessment - 05/01/15 0001    Assessment   Medical Diagnosis M81.0 Osteoporosis   Onset Date/Surgical Date 04/07/15   Prior Therapy None   Precautions   Precautions Other (comment)  No foward flexion, no joint mobilization   Balance Screen   Has the patient fallen in the past 6 months No   Has the patient had a decrease in activity level because of a fear of falling?  Yes   Is the patient reluctant to leave their home because of a fear of falling?  No   Home Environment   Living Environment Private residence   Living Arrangements Alone   Type of Home House   Home Access Stairs to enter   Entrance Stairs-Number of Steps 4   Entrance Stairs-Rails Left;Right   Home Layout One level   Home Equipment Cane - quad;Grab bars - tub/shower  walk in shower   Prior Function   Level of Independence Independent   Vocation Retired   Leisure read and socialize   Cognition   Overall Cognitive Status Within Functional Limits for tasks assessed   Posture/Postural Control   Posture/Postural Control Postural limitations   Postural Limitations Rounded Shoulders;Forward head;Decreased lumbar lordosis  left lateral shift, increased cervical thoracic kyphosis   ROM / Strength   AROM / PROM / Strength Strength   Strength   Strength Assessment Site Knee;Hip;Ankle   Right/Left Hip --  bil. 4/5   Right/Left Knee --  bil. 4/5   Right/Left Ankle --  bil. 4/5   Ambulation/Gait   Ambulation/Gait Yes   Ambulation/Gait Assistance 6: Modified independent (Device/Increase time)   Assistive device Small based quad cane   Stairs Yes   Stair Management Technique Step to pattern;One rail Left;One rail Right  to be careful  Balance   Balance Assessed Yes   Standardized Balance Assessment   Standardized Balance Assessment Timed Up and Go Test   Timed Up and Go Test   TUG Normal TUG   Normal TUG (seconds) 21   TUG Comments held small base quad cane in the air                           PT Education - 05/01/15 1612    Education provided Yes   Education Details tips to avoid falls, what osteoporosis is, What postures to avoid    Person(s) Educated Patient   Methods Explanation;Verbal cues;Handout   Comprehension Verbalized understanding          PT Short Term Goals - 05/01/15 1701    PT SHORT TERM GOAL #1   Title independent with Initial HEP    Time 3   Period Weeks   Status New   PT SHORT TERM GOAL #2   Title TUG score </= 16  sec   Time 3   Period Weeks   Status New   PT SHORT TERM GOAL #3   Title understand what osteoporosis is and how it affects the bones and puts her at risk for fractures   Time 3   Period Weeks   Status New   PT SHORT TERM GOAL #4   Title understand what postures to avoid that puts her at risk of spinal fractures   Time 3   Period Weeks   Status New           PT Long Term Goals - 05/01/15 1702    PT LONG TERM GOAL #1   Title independent with HEP and understands how to progress it.    Time 6   Period Weeks   Status New   PT LONG TERM GOAL #2   Title TUG score while holding onto cane </= 13 seconds   Time 6   Period Weeks   Status New   PT LONG TERM GOAL #3   Title started a walking program to improve cardio and reduce risk of falls   Time 6   Period Weeks   Status New               Plan - 05/01/15 1613    Clinical Impression Statement Patient is a 81 year old female with diagnosis of Osteoporosis and T-score is -4.1.  TUG score is 21 seconds while holding onto her quad cane.  Patient reports she carries her quad cane due to fear of falling. Patient has not fallen in the past 6 months but she has fallen 2 years ago breaking her right wrist and cracking her right knee. Patient bilateral lower extremity strength is 4/5.  Patient goes up and down steps with a step to step pattern using 2 railings. Patient posture consists of forward head, rounded shoulders, hips shifted to the left, and increased cervical thoracic kyphosis.  Patient would benefit from physical therapy to improve her strength, posture and improve her balance.    Pt will benefit from skilled therapeutic intervention in order to improve on the following deficits Difficulty walking;Decreased endurance;Decreased activity tolerance;Decreased balance;Decreased mobility;Decreased strength;Impaired flexibility   Rehab Potential Excellent   Clinical Impairments Affecting Rehab Potential None   PT Frequency 2x /  week   PT Duration 6 weeks   PT Treatment/Interventions ADLs/Self Care Home Management;Stair training;Gait training;Therapeutic activities;Therapeutic exercise;Balance training;Neuromuscular re-education;Manual techniques;Patient/family education   PT Next  Visit Plan body mechanics with home tasks, balance exercises, decompression for osteo   PT Home Exercise Plan body mechanics with home tasks, balance exercises, decompression for oste   Recommended Other Services None   Consulted and Agree with Plan of Care Patient          G-Codes - 05/21/2015 1611    Functional Assessment Tool Used Therapist discretion due to TUG score 21 seconds therefore 40% limitation   Functional Limitation Other PT primary   Other PT Primary Current Status ([id number]) At least 40 percent but less than 60 percent impaired, limited or restricted   Other PT Primary Goal Status (F6213) At least 1 percent but less than 20 percent impaired, limited or restricted       Problem List There are no active problems to display for this patient.   GRAY,CHERYL,PT May 21, 2015, 5:07 PM  [facility] [address], [address] [place], [place], [address] Phone: [phone]   Fax:  [phone]

## 2015-05-02 ENCOUNTER — Ambulatory Visit: Payer: Medicare PPO | Admitting: Physical Therapy

## 2015-05-02 ENCOUNTER — Encounter: Payer: Self-pay | Admitting: Physical Therapy

## 2015-05-02 DIAGNOSIS — R531 Weakness: Secondary | ICD-10-CM

## 2015-05-02 NOTE — Patient Instructions (Addendum)
   Resources  National Osteoporosis [email or web address]   Exercise for Osteoporosis; A Safe and Effective Way to Build Bone Density and Muscle Strength By: Myra Gianotti, M.A.   [facility] Osteoporosis exercises  Laying on bed on back with knees bent.  1. Head Press: Press head into bed or one pillow with a neutral spine.  2. Shoulder Press:  Press shoulders down toward the bed. You are not squeezing shoulder blades together.   3. Leg lengthener: Pull your toes toward your head and push your heel toward the end of the bed like you're trying to make yourself taller.  4. Leg Press: Press the entire leg down toward the bed. Imagine you are lying on the beach and you are making an impression of your leg.

## 2015-05-02 NOTE — Therapy (Signed)
[facility] [address] [place], [place], [address] Phone: [phone]   Fax:  [phone]  Physical Therapy Treatment  Patient Details  Name: Rachel Rangel MRN: [id number] Date of Birth: 1934/07/30 Referring Provider:  Talmage Coin, MD  Encounter Date: 05/02/2015      PT End of Session - 05/02/15 1411    Visit Number 2   Number of Visits 10   Date for PT Re-Evaluation 06/12/15   PT Start Time 1400   PT Stop Time 1445   PT Time Calculation (min) 45 min   Activity Tolerance Patient tolerated treatment well   Behavior During Therapy [facility] for tasks assessed/performed      Past Medical History  Diagnosis Date  . Diabetes mellitus without complication   . Benign hypertension   . Hyperlipidemia   . Vitamin D deficiency   . Overactive bladder   . Osteoporosis 2010  . History of complete eye exam 12/25/2012    Normal eye exam, no retinopathy [facility]  . Edema     Left pedal   . Vitamin D deficiency     Past Surgical History  Procedure Laterality Date  . Abdominal hysterectomy  1975    partial    There were no vitals filed for this visit.  Visit Diagnosis:  Weakness      Subjective Assessment - 05/02/15 1407    Subjective Pt wishes to improve bone situation learn proper exercises and improve her balance due to feeling unsteadiness of gait. Pt purchase a quad cane to assist with this.   Currently in Pain? No/denies   Multiple Pain Sites No                         [facility] Adult PT Treatment/Exercise - 05/02/15 0001    Posture/Postural Control   Posture/Postural Control Postural limitations   Postural Limitations Rounded Shoulders;Forward head;Decreased lumbar lordosis   Exercises   Exercises Lumbar;Knee/Hip   Lumbar Exercises: Aerobic   Stationary Bike L1 x   Elliptical --  pt with complains of slightly discomfort in left knee with t   Lumbar Exercises: Standing   Other Standing Lumbar Exercises Practiced sit to stand with proper technique -hinging in hip keeping spine neutral   Other Standing Lumbar Exercises verbally reviewed osteoporosis risk factors and recommendations   Lumbar Exercises: Supine   Other Supine Lumbar Exercises Meeks Decompression exercises restiing position, head press, shoulder press leg lengthener, leg press unillateral x 5 with 5 sec hold   Knee/Hip Exercises: Standing   Rebounder 3 directions x 1 min each, with B UE support   Gait Training adjusted quad cane to correct setting and practiced ambulation with it                PT Education - 05/02/15 1435    Education Details Meeks decompression exercise   Person(s) Educated Patient   Methods Explanation;Tactile cues;Handout   Comprehension Verbalized understanding;Returned demonstration          PT Short Term Goals - 05/01/15 1701    PT SHORT TERM GOAL #1   Title independent with Initial HEP    Time 3   Period Weeks   Status New   PT SHORT TERM GOAL #2   Title TUG score </= 16 sec   Time 3   Period Weeks   Status New   PT SHORT TERM GOAL #3   Title understand what osteoporosis is  and how it affects the bones and puts her at risk for fractures   Time 3   Period Weeks   Status New   PT SHORT TERM GOAL #4   Title understand what postures to avoid that puts her at risk of spinal fractures   Time 3   Period Weeks   Status New           PT Long Term Goals - 2015-05-19 1702    PT LONG TERM GOAL #1   Title independent with HEP and understands how to progress it.    Time 6   Period Weeks   Status New   PT LONG TERM GOAL #2   Title TUG score while holding onto cane </= 13 seconds   Time 6   Period Weeks   Status New   PT LONG TERM GOAL #3   Title started a walking program to improve cardio and reduce risk of falls   Time 6   Period Weeks   Status New               Plan - 05/02/15 1412    Clinical Impression Statement Pt is a 81 year  old female with diagnosis of Osteoporosis and T-score is -4.1. TUG score is 21seconds. while using quad cane. Pt has fallen 2 years ago breaking her right wrist and cracking her right knee. Patient will benefit from skilled PT to address overall endurance, strength and balance.   Pt will benefit from skilled therapeutic intervention in order to improve on the following deficits Difficulty walking;Decreased endurance;Decreased activity tolerance;Decreased balance;Decreased mobility;Decreased strength;Impaired flexibility   Rehab Potential Excellent   PT Frequency 2x / week   PT Duration 6 weeks   PT Treatment/Interventions ADLs/Self Care Home Management;Stair training;Gait training;Therapeutic activities;Therapeutic exercise;Balance training;Neuromuscular re-education;Manual techniques;Patient/family education   PT Next Visit Plan body mechanics with home tasks, balance exercises, decompression for osteo   PT Home Exercise Plan review body mechanics with home tasks, balance exercises, decompression for osteo   Consulted and Agree with Plan of Care Patient          G-Codes - 05/19/15 1611    Functional Assessment Tool Used Therapist discretion due to TUG score 21 seconds therefore 40% limitation   Functional Limitation Other PT primary   Other PT Primary Current Status (M5784) At least 40 percent but less than 60 percent impaired, limited or restricted   Other PT Primary Goal Status (O9629) At least 1 percent but less than 20 percent impaired, limited or restricted      Problem List There are no active problems to display for this patient.   NAUMANN-HOUEGNIFIO,Charie Pinkus PTA 05/02/2015, 2:56 PM  [facility] [address], [address] [place], [place], [address] Phone: [phone]   Fax:  [phone]

## 2015-05-09 ENCOUNTER — Ambulatory Visit: Payer: Medicare PPO | Admitting: Physical Therapy

## 2015-05-09 ENCOUNTER — Encounter: Payer: Self-pay | Admitting: Physical Therapy

## 2015-05-09 DIAGNOSIS — R531 Weakness: Secondary | ICD-10-CM

## 2015-05-09 NOTE — Therapy (Signed)
[facility] [address] [place], [place], [address] Phone: [phone]   Fax:  [phone]  Physical Therapy Treatment  Patient Details  Name: Rachel Rangel MRN: [id number] Date of Birth: 1934/05/16 Referring Provider:  Talmage Coin, MD  Encounter Date: 05/09/2015      PT End of Session - 05/09/15 0943    Visit Number 3   Number of Visits 10   Date for PT Re-Evaluation 06/12/15   PT Start Time 0930   PT Stop Time 1015   PT Time Calculation (min) 45 min   Activity Tolerance Patient tolerated treatment well   Behavior During Therapy [facility] for tasks assessed/performed      Past Medical History  Diagnosis Date  . Diabetes mellitus without complication   . Benign hypertension   . Hyperlipidemia   . Vitamin D deficiency   . Overactive bladder   . Osteoporosis 2010  . History of complete eye exam 12/25/2012    Normal eye exam, no retinopathy [facility]  . Edema     Left pedal   . Vitamin D deficiency     Past Surgical History  Procedure Laterality Date  . Abdominal hysterectomy  1975    partial    There were no vitals filed for this visit.  Visit Diagnosis:  Weakness      Subjective Assessment - 05/09/15 0942    Subjective Pt wishes to improve bone situation and learn proper exercises to improve balance due to feeling unsteadiness of gait, pt is using quad cane to assist    Currently in Pain? No/denies   Multiple Pain Sites No                         [facility] Adult PT Treatment/Exercise - 05/09/15 0001    Ambulation/Gait   Ambulation/Gait Yes   Ambulation/Gait Assistance 6: Modified independent (Device/Increase time)   Assistive device Small based quad cane   Gait Comments --   Exercises   Exercises Lumbar;Knee/Hip   Lumbar Exercises: Aerobic   Stationary Bike L1 x   UBE (Upper Arm Bike) L0 x 6 (3/3)   Knee/Hip Exercises: Standing   Forward Step Up 1 set;10 reps   SLS practiced with each leg able to hold up to 3 sec with Lt LE but Rt LE needs 1UE support  x3 each leg   Rebounder 3 directions x 1 min each, with B UE support   Gait Training sidestepping,, tandem stance, tandem walk along cou nter 20" x 6 each task                  PT Short Term Goals - 05/09/15 1324    PT SHORT TERM GOAL #1   Title independent with Initial HEP    Time 3   Period Weeks   Status On-going   PT SHORT TERM GOAL #2   Title TUG score </= 16 sec   Time 3   Period Weeks   Status On-going   PT SHORT TERM GOAL #3   Title understand what osteoporosis is and how it affects the bones and puts her at risk for fractures   Time 3   Period Weeks   Status On-going   PT SHORT TERM GOAL #4   Title understand what postures to avoid that puts her at risk of spinal fractures   Time 3   Period Weeks   Status On-going  PT Long Term Goals - 05/09/15 3086    PT LONG TERM GOAL #1   Title independent with HEP and understands how to progress it.    Time 8   Period Weeks   Status On-going   PT LONG TERM GOAL #2   Title TUG score while holding onto cane </= 13 seconds   Time 6   Period Weeks   Status On-going   PT LONG TERM GOAL #3   Title started a walking program to improve cardio and reduce risk of falls   Time 6   Period Weeks   Status On-going               Plan - 05/09/15 0944    Clinical Impression Statement Pt is a 81 year old female with diagnosis of Osteoporosis and T-score is -4.1. Pt has fallen 2 years ago and fractured her right wrist and cracked her right knee, now fearful of falling. Pt will benefit from skilled PT to address overall strength, endurance and balance   Pt will benefit from skilled therapeutic intervention in order to improve on the following deficits Difficulty walking;Decreased endurance;Decreased activity tolerance;Decreased balance;Decreased mobility;Decreased strength;Impaired flexibility   Rehab Potential  Excellent   PT Frequency 2x / week   PT Duration 6 weeks   PT Treatment/Interventions ADLs/Self Care Home Management;Stair training;Gait training;Therapeutic activities;Therapeutic exercise;Balance training;Neuromuscular re-education;Manual techniques;Patient/family education   PT Next Visit Plan continue strengthening, balance and endurance   Consulted and Agree with Plan of Care Patient        Problem List There are no active problems to display for this patient.   NAUMANN-HOUEGNIFIO,Elsie Baynes PTA 05/09/2015, 10:13 AM  [facility] [address], [address] [place], [place], [address] Phone: [phone]   Fax:  [phone]

## 2015-05-11 ENCOUNTER — Ambulatory Visit: Payer: Medicare PPO | Admitting: Physical Therapy

## 2015-05-11 ENCOUNTER — Encounter: Payer: Self-pay | Admitting: Physical Therapy

## 2015-05-11 DIAGNOSIS — R531 Weakness: Secondary | ICD-10-CM

## 2015-05-11 NOTE — Therapy (Signed)
[facility] [address] [place], [place], [address] Phone: [phone]   Fax:  [phone]  Physical Therapy Treatment  Patient Details  Name: Rachel Rangel MRN: [id number] Date of Birth: [birth year] Referring Provider:  Talmage Coin, MD  Encounter Date: 05/11/2015      PT End of Session - 05/11/15 0949    Visit Number 4   Number of Visits 10   Date for PT Re-Evaluation 06/12/15   PT Start Time 0930   PT Stop Time 1015   PT Time Calculation (min) 45 min   Activity Tolerance Patient tolerated treatment well   Behavior During Therapy [facility] for tasks assessed/performed      Past Medical History  Diagnosis Date  . Diabetes mellitus without complication   . Benign hypertension   . Hyperlipidemia   . Vitamin D deficiency   . Overactive bladder   . Osteoporosis 2010  . History of complete eye exam 12/25/2012    Normal eye exam, no retinopathy [facility]  . Edema     Left pedal   . Vitamin D deficiency     Past Surgical History  Procedure Laterality Date  . Abdominal hysterectomy  1975    partial    There were no vitals filed for this visit.  Visit Diagnosis:  Weakness      Subjective Assessment - 05/11/15 0947    Subjective Pt reports feeling more confidence with ambulation, less unsteady of gait observed, still using her small quad cane but lifting it up in the air at times   Currently in Pain? No/denies                         [facility] Adult PT Treatment/Exercise - 05/11/15 0001    Ambulation/Gait   Ambulation/Gait Yes   Ambulation/Gait Assistance 6: Modified independent (Device/Increase time)   Assistive device Small based quad cane   Exercises   Exercises Lumbar;Knee/Hip   Lumbar Exercises: Aerobic   Stationary Bike L1 x   UBE (Upper Arm Bike) L1 x 6 (3/3)   Lumbar Exercises: Supine   Other Supine Lumbar Exercises Meeks Decompression exercises resting position,  head press, shoulder press leg lengthener, leg press unillateral x 5 with 5 sec hold   Knee/Hip Exercises: Standing   Forward Step Up 1 set;10 reps;Hand Hold: 2;Step Height: 6"  each leg, slow pace   SLS --   Rebounder 3 directions x 1 min each, with B UE support   Gait Training --                  PT Short Term Goals - 05/09/15 1324    PT SHORT TERM GOAL #1   Title independent with Initial HEP    Time 3   Period Weeks   Status On-going   PT SHORT TERM GOAL #2   Title TUG score </= 16 sec   Time 3   Period Weeks   Status On-going   PT SHORT TERM GOAL #3   Title understand what osteoporosis is and how it affects the bones and puts her at risk for fractures   Time 3   Period Weeks   Status On-going   PT SHORT TERM GOAL #4   Title understand what postures to avoid that puts her at risk of spinal fractures   Time 3   Period Weeks   Status On-going  PT Long Term Goals - 05/09/15 4098    PT LONG TERM GOAL #1   Title independent with HEP and understands how to progress it.    Time 8   Period Weeks   Status On-going   PT LONG TERM GOAL #2   Title TUG score while holding onto cane </= 13 seconds   Time 6   Period Weeks   Status On-going   PT LONG TERM GOAL #3   Title started a walking program to improve cardio and reduce risk of falls   Time 6   Period Weeks   Status On-going               Plan - 05/11/15 0949    Clinical Impression Statement Pt is a 89 old female with diagnosis of osteoporosis ant T-score is -4.1. Pt has fallen and fractured her Rt wrist and cracked her Rt knee falling. Pt appears with more conficence with activities in PT clinic as observed on the mini trampoline. Pt will benefit from skilled    Pt will benefit from skilled therapeutic intervention in order to improve on the following deficits Difficulty walking;Decreased endurance;Decreased activity tolerance;Decreased balance;Decreased mobility;Decreased strength;Impaired  flexibility   Rehab Potential Excellent   PT Frequency 2x / week   PT Duration 6 weeks   PT Treatment/Interventions ADLs/Self Care Home Management;Stair training;Gait training;Therapeutic activities;Therapeutic exercise;Balance training;Neuromuscular re-education;Manual techniques;Patient/family education   PT Next Visit Plan continue strengthening, balance and endurance   PT Home Exercise Plan continue with balance exercises, decompression for osteo   Consulted and Agree with Plan of Care Patient        Problem List There are no active problems to display for this patient.   NAUMANN-HOUEGNIFIO,[name] PTA 05/11/2015, 10:16 AM  [facility] [address], [address] [place], [place], [address] Phone: [phone]   Fax:  [phone]

## 2015-05-22 DIAGNOSIS — Z720 Tobacco use: Secondary | ICD-10-CM | POA: Diagnosis not present

## 2015-05-22 DIAGNOSIS — F17200 Nicotine dependence, unspecified, uncomplicated: Secondary | ICD-10-CM | POA: Diagnosis not present

## 2015-05-22 DIAGNOSIS — M810 Age-related osteoporosis without current pathological fracture: Secondary | ICD-10-CM | POA: Diagnosis not present

## 2015-05-23 ENCOUNTER — Ambulatory Visit: Payer: Medicare PPO

## 2015-05-23 DIAGNOSIS — R531 Weakness: Secondary | ICD-10-CM | POA: Diagnosis not present

## 2015-05-23 NOTE — Patient Instructions (Signed)
Knee High   Holding stable object, raise knee to hip level, then lower knee. Repeat with other knee. Complete __10_ repetitions. Do __2__ sessions per day.  ABDUCTION: Standing (Active)   Stand, feet flat. Lift right leg out to side. Use _0__ lbs. Complete __10_ repetitions. Perform __2_ sessions per day.    EXTENSION: Standing (Active)  Stand, both feet flat. Draw right leg behind body as far as possible. Use 0___ lbs. Complete 10 repetitions. Perform __2_ sessions per day.  Copyright  VHI. All rights reserved.   [facility] [address], [address] [place], [place] [address] Phone # [phone] Fax [phone] 

## 2015-05-23 NOTE — Therapy (Signed)
[facility] [address] [place], [place], [address] Phone: [phone]   Fax:  [phone]  Physical Therapy Treatment  Patient Details  Name: Rachel Rangel MRN: [id number] Date of Birth: [birth year] Referring Provider:  Talmage Coin, MD  Encounter Date: 05/23/2015      PT End of Session - 05/23/15 1305    Visit Number 5   Number of Visits 10   Date for PT Re-Evaluation 07/07/15   PT Start Time 1229   PT Stop Time 1312   PT Time Calculation (min) 43 min   Activity Tolerance Patient tolerated treatment well   Behavior During Therapy [facility] for tasks assessed/performed      Past Medical History  Diagnosis Date  . Diabetes mellitus without complication   . Benign hypertension   . Hyperlipidemia   . Vitamin D deficiency   . Overactive bladder   . Osteoporosis 2010  . History of complete eye exam 12/25/2012    Normal eye exam, no retinopathy [facility]  . Edema     Left pedal   . Vitamin D deficiency     Past Surgical History  Procedure Laterality Date  . Abdominal hysterectomy  1975    partial    There were no vitals filed for this visit.  Visit Diagnosis:  Weakness - Plan: PT plan of care cert/re-cert      Subjective Assessment - 05/23/15 1232    Subjective Pt saw Dr Sharl Ma yesterday.  Might start taking a new medication   Patient Stated Goals learn exercises   Currently in Pain? No/denies            [facility] PT Assessment - 05/23/15 0001    Assessment   Medical Diagnosis M81.0 Osteoporosis   Onset Date/Surgical Date 04/07/15   Prior Function   Level of Independence Independent   Vocation Retired   Leisure read and socialize   Posture/Postural Control   Posture/Postural Control Postural limitations   Postural Limitations Rounded Shoulders;Forward head;Decreased lumbar lordosis   Strength   Overall Strength Deficits   Right/Left Hip --  bil hips 4+/5 flexion   Right/Left Knee --   bil knees 4+/5   Right/Left Ankle --  5/5 ankle DF bil   Balance   Balance Assessed Yes   Timed Up and Go Test   Normal TUG (seconds) 15  21 on tile floor, 15 on carpet                     [facility] Adult PT Treatment/Exercise - 05/23/15 0001    Lumbar Exercises: Aerobic   Stationary Bike L1 x   UBE (Upper Arm Bike) L1 x 6 (3/3)   Knee/Hip Exercises: Standing   Hip Flexion Stengthening;Both;2 sets;10 reps   Hip Abduction 2 sets;10 reps;Stengthening;Both   Hip Extension Stengthening;Both;2 sets;10 reps   Rebounder 3 directions x 1 min each, with B UE support                PT Education - 05/23/15 1301    Education provided Yes   Education Details standing hip abduction, extension and marching   Person(s) Educated Patient   Methods Explanation;Handout   Comprehension Verbalized understanding;Returned demonstration          PT Short Term Goals - 05/23/15 1233    PT SHORT TERM GOAL #1   Title independent with Initial HEP    Time 3   Period Weeks   Status Achieved  PT SHORT TERM GOAL #2   Title TUG score </= 16 sec   Time 3   Period Weeks   Status On-going  21 then 53- not consistent   PT SHORT TERM GOAL #3   Title understand what osteoporosis is and how it affects the bones and puts her at risk for fractures   Status Achieved  Pt has received education   PT SHORT TERM GOAL #4   Title understand what postures to avoid that puts her at risk of spinal fractures   Status Achieved  Pt has received education           PT Long Term Goals - 05/23/15 1248    PT LONG TERM GOAL #1   Title independent with HEP and understands how to progress it.    Time 8   Period Weeks   Status On-going  pt with only 5 sessions so limited advancement   PT LONG TERM GOAL #2   Title TUG score while holding onto cane </= 13 seconds   Time 6   Period Weeks   Status On-going  21 then 15 seconds   PT LONG TERM GOAL #3   Title started a walking program to  improve cardio and reduce risk of falls   Time 6   Period Weeks   Status On-going  Pt has not initiated due to instability               Plan - 05/23/15 1249    Clinical Impression Statement Pt is a 81 y.o. female with diagnosis of osteoporosis and T score of -4.1.  Pt has had falls resutling in fractures.  Pt has received osteoporosis information/educaiton over 4 PT sessions and advancement of initial HEP for strength, spinal decompression and balance training.  Pt with limited attendance due schedule and 1x/week and will benefit from continued PT for balance exercises, LE strength progression and advanced osteo info to prevent falls and fractures.     Pt will benefit from skilled therapeutic intervention in order to improve on the following deficits Difficulty walking;Decreased endurance;Decreased activity tolerance;Decreased balance;Decreased mobility;Decreased strength;Impaired flexibility   Rehab Potential Excellent   PT Frequency 1x / week   PT Duration 6 weeks   PT Treatment/Interventions ADLs/Self Care Home Management;Stair training;Gait training;Therapeutic activities;Therapeutic exercise;Balance training;Neuromuscular re-education;Manual techniques;Patient/family education   PT Next Visit Plan continue strengthening, balance and endurance. review HEP: standing hip exercises.  Check on [employer] visit approval.   Consulted and Agree with Plan of Care Patient        Problem List There are no active problems to display for this patient.   TAKACS,KELLY, PT 05/23/2015, 1:08 PM  [facility] [address] [place], [place], [address] Phone: [phone]   Fax:  [phone]

## 2015-05-29 ENCOUNTER — Ambulatory Visit: Payer: Medicare PPO | Admitting: Physical Therapy

## 2015-05-29 ENCOUNTER — Encounter: Payer: Self-pay | Admitting: Physical Therapy

## 2015-05-29 DIAGNOSIS — R531 Weakness: Secondary | ICD-10-CM | POA: Diagnosis not present

## 2015-05-29 DIAGNOSIS — E559 Vitamin D deficiency, unspecified: Secondary | ICD-10-CM | POA: Diagnosis not present

## 2015-05-29 DIAGNOSIS — I10 Essential (primary) hypertension: Secondary | ICD-10-CM | POA: Diagnosis not present

## 2015-05-29 DIAGNOSIS — Z1389 Encounter for screening for other disorder: Secondary | ICD-10-CM | POA: Diagnosis not present

## 2015-05-29 DIAGNOSIS — E1149 Type 2 diabetes mellitus with other diabetic neurological complication: Secondary | ICD-10-CM | POA: Diagnosis not present

## 2015-05-29 DIAGNOSIS — N3281 Overactive bladder: Secondary | ICD-10-CM | POA: Diagnosis not present

## 2015-05-29 DIAGNOSIS — Z23 Encounter for immunization: Secondary | ICD-10-CM | POA: Diagnosis not present

## 2015-05-29 DIAGNOSIS — E785 Hyperlipidemia, unspecified: Secondary | ICD-10-CM | POA: Diagnosis not present

## 2015-05-29 NOTE — Therapy (Addendum)
[facility] [address] [place], [place], [address] Phone: [phone]   Fax:  [phone]  Physical Therapy Treatment  Patient Details  Name: Rachel Rangel MRN: [id number] Date of Birth: August 16, 1934 Referring Provider:  Delrae Rend, MD  Encounter Date: 05/29/2015      PT End of Session - 05/29/15 1421    Visit Number 6   Date for PT Re-Evaluation 07/07/15   PT Start Time 4037   PT Stop Time 1444   PT Time Calculation (min) 42 min   Activity Tolerance Patient tolerated treatment well   Behavior During Therapy [facility] for tasks assessed/performed      Past Medical History  Diagnosis Date  . Diabetes mellitus without complication   . Benign hypertension   . Hyperlipidemia   . Vitamin D deficiency   . Overactive bladder   . Osteoporosis 2010  . History of complete eye exam 12/25/2012    Normal eye exam, no retinopathy [facility]  . Edema     Left pedal   . Vitamin D deficiency     Past Surgical History  Procedure Laterality Date  . Abdominal hysterectomy  1975    partial    There were no vitals filed for this visit.  Visit Diagnosis:  Weakness      Subjective Assessment - 05/29/15 1417    Subjective Pt reports feeling stiff today, she had to get up very early today. Pt saw primary physician today and reports her bloodsugar and bloodpressure is good.   Currently in Pain? No/denies     Delayed Addendum for PT treatment on 05/29/15 Bike Level 1 x 28mn UBE level 1 x 636m Standing hip flexion, abduction and extension, 2# weights added and using both UE for support  2 x 10 each, with verbal cues for trunk extension  Trampoline 3 directions x 1 min each Half squatting with B UE support x 10                               PT Short Term Goals - 05/29/15 1426    PT SHORT TERM GOAL #2   Title TUG score </= 16 sec  as of 05/29/15 25sec, 19sec, 17sec without use of cane    Time 3   Period Weeks   Status Partially Met   PT SHORT TERM GOAL #3   Title understand what osteoporosis is and how it affects the bones and puts her at risk for fractures   Time 3   Period Weeks   Status Achieved   PT SHORT TERM GOAL #4   Title understand what postures to avoid that puts her at risk of spinal fractures   Time 3   Period Weeks   Status Achieved           PT Long Term Goals - 05/29/15 1430    PT LONG TERM GOAL #1   Title independent with HEP and understands how to progress it.    Time 8   Period Weeks   Status On-going   PT LONG TERM GOAL #2   Title TUG score while holding onto cane </= 13 seconds   Time 6   Period Weeks   Status On-going   PT LONG TERM GOAL #3   Title started a walking program to improve cardio and reduce risk of falls   Time 6   Period Weeks   Status On-going  Plan - 05/29/15 1424    Clinical Impression Statement Pt is a 81 y.o. female with diagnosis of osteopososis and T score of -4.1. Pt will continue to benfit from continued of PT for balance and strength to prevent falls and fractures   Pt will benefit from skilled therapeutic intervention in order to improve on the following deficits Difficulty walking;Decreased endurance;Decreased activity tolerance;Decreased balance;Decreased mobility;Decreased strength;Impaired flexibility   Rehab Potential Excellent   PT Frequency 1x / week   PT Duration 6 weeks   PT Treatment/Interventions ADLs/Self Care Home Management;Stair training;Gait training;Therapeutic activities;Therapeutic exercise;Balance training;Neuromuscular re-education;Manual techniques;Patient/family education   PT Next Visit Plan continue strengthening, balance and endurance. review HEP: standing hip exercises.  Check on [employer] visit approval.   PT Home Exercise Plan continue with balance exercises, decompression for osteo   Consulted and Agree with Plan of Care Patient        Problem List There  are no active problems to display for this patient.   Rangel,Rachel Rangel PTA Rachel Rangel, PTA 06/08/2015 5:18 PM  For addendum PT treatment on 05/29/15  05/29/2015, 2:47 PM  [facility] [address], [place], [place], [address] Phone: [phone]   Fax:  [phone]

## 2015-06-08 ENCOUNTER — Ambulatory Visit: Payer: Medicare PPO | Attending: Internal Medicine | Admitting: Physical Therapy

## 2015-06-08 ENCOUNTER — Encounter: Payer: Self-pay | Admitting: Physical Therapy

## 2015-06-08 DIAGNOSIS — R531 Weakness: Secondary | ICD-10-CM | POA: Insufficient documentation

## 2015-06-08 NOTE — Therapy (Addendum)
[facility] [address] [place], [place], [address] Phone: [phone]   Fax:  [phone]  Physical Therapy Treatment  Patient Details  Name: Rachel Rangel MRN: [id number] Date of Birth: 1934-03-31 Referring Provider:  Delrae Rend, MD  Encounter Date: 06/08/2015      PT End of Session - 06/08/15 1055    Visit Number 7   Number of Visits 10   Date for PT Re-Evaluation 07/07/15   Authorization Type --  [employer]    Authorization Time Period 8/24 to 07/08/15   Authorization - Visit Number 2   Authorization - Number of Visits 3   PT Start Time 9518   PT Stop Time 1100   PT Time Calculation (min) 45 min   Activity Tolerance Patient tolerated treatment well   Behavior During Therapy [facility] for tasks assessed/performed      Past Medical History  Diagnosis Date  . Diabetes mellitus without complication   . Benign hypertension   . Hyperlipidemia   . Vitamin D deficiency   . Overactive bladder   . Osteoporosis 2010  . History of complete eye exam 12/25/2012    Normal eye exam, no retinopathy [facility]  . Edema     Left pedal   . Vitamin D deficiency     Past Surgical History  Procedure Laterality Date  . Abdominal hysterectomy  1975    partial    There were no vitals filed for this visit.  Visit Diagnosis:  Weakness      Subjective Assessment - 06/08/15 1025    Subjective not walking well today, feeling unsteadiness even with assist of quad cane   Currently in Pain? No/denies                         [facility] Adult PT Treatment/Exercise - 06/08/15 0001    Posture/Postural Control   Posture/Postural Control Postural limitations   Postural Limitations Rounded Shoulders;Forward head;Decreased lumbar lordosis   Timed Up and Go Test   TUG Normal TUG  4 trails: 20, 18, 17, 16 sec confidence improved with reps    Exercises   Exercises Lumbar;Knee/Hip   Lumbar Exercises: Aerobic   Stationary Bike L1 x 23min   UBE (Upper Arm Bike) L1 x 6 (3/3)   Knee/Hip Exercises: Standing   Hip Flexion Stengthening;Both;2 sets;10 reps   Hip Abduction 2 sets;10 reps;Stengthening;Both   Hip Extension Stengthening;Both;2 sets;10 reps   Forward Step Up 1 set;10 reps;Hand Hold: 2;Step Height: 6"   Rebounder 3 directions x 1 min each, with B UE support                  PT Short Term Goals - 06/08/15 1037    PT SHORT TERM GOAL #1   Title independent with Initial HEP    Time 3   Period Weeks   Status Achieved   PT SHORT TERM GOAL #2   Title TUG score </= 16 sec  after 4 trails able to walk in 16 sec without assistive device   Time 3   Period Weeks   Status Achieved   PT SHORT TERM GOAL #3   Title understand what osteoporosis is and how it affects the bones and puts her at risk for fractures   Time 3   Period Weeks   Status Achieved   PT SHORT TERM GOAL #4   Title understand what postures to avoid that puts her at risk  of spinal fractures   Time 3   Period Weeks   Status Achieved           PT Long Term Goals - 06/08/15 1029    PT LONG TERM GOAL #1   Title independent with HEP and understands how to progress it.    Time 8   Period Weeks   Status On-going   PT LONG TERM GOAL #2   Title TUG score while holding onto cane </= 13 seconds   Time 8   Period Weeks   Status On-going   PT LONG TERM GOAL #3   Title started a walking program to improve cardio and reduce risk of falls   Time 8   Period Weeks   Status On-going               Plan - 06/08/15 1058    Clinical Impression Statement Pt is a 81 y.o. female with diagnosis of osteoporosis and T-score of -4.1. Pt will continue to benefit from PT to improve strength and balance and better understanding of osteoporosis and fracture prevention   Pt will benefit from skilled therapeutic intervention in order to improve on the following deficits Difficulty walking;Decreased endurance;Decreased activity  tolerance;Decreased balance;Decreased mobility;Decreased strength;Impaired flexibility   Rehab Potential Excellent   PT Frequency 1x / week   PT Duration 6 weeks   PT Treatment/Interventions ADLs/Self Care Home Management;Stair training;Gait training;Therapeutic activities;Therapeutic exercise;Balance training;Neuromuscular re-education;Manual techniques;Patient/family education   PT Next Visit Plan continue strengthening, balance and endurance. review HEP: standing hip exercises.  Check on [employer] visit approval.   PT Home Exercise Plan continue with balance exercises, decompression for osteo   Consulted and Agree with Plan of Care Patient      G-codes: Other PT category Goal status CI D/C status CK  Problem List There are no active problems to display for this patient.   NAUMANN-HOUEGNIFIO,Eran Mistry PTA 06/08/2015, 11:02 AM  PHYSICAL THERAPY DISCHARGE SUMMARY  Visits from Start of Care: 7  Current functional level related to goals / functional outcomes: See above for current status. Pt didn't return for final session.     Remaining deficits: Unknown of current status as pt didn't return after 06/08/15.  Pt has HEP in place and has received osteoporosis education.     Education / Equipment: Osteoporosis education, HEP Plan: Patient agrees to discharge.  Patient goals were partially met. Patient is being discharged due to not returning since the last visit.  ?????   Sigurd Sos, PT 07/04/2015 8:50 AM  [facility] [address] [place], [place], [address] Phone: [phone]   Fax:  [phone]

## 2015-06-22 ENCOUNTER — Telehealth: Payer: Self-pay

## 2015-06-22 ENCOUNTER — Ambulatory Visit: Payer: Medicare PPO

## 2015-06-22 NOTE — Telephone Encounter (Signed)
No show for appt today.  Plan of care ends 07/07/15 and 1 [employer] visit remaining.

## 2015-07-03 ENCOUNTER — Ambulatory Visit: Payer: Medicare PPO | Attending: Internal Medicine

## 2015-09-05 ENCOUNTER — Emergency Department (HOSPITAL_COMMUNITY): Payer: Medicare PPO

## 2015-09-05 ENCOUNTER — Emergency Department (HOSPITAL_COMMUNITY)
Admission: EM | Admit: 2015-09-05 | Discharge: 2015-09-06 | Disposition: A | Discharge status: Home / Self Care | Payer: Medicare PPO | Source: Home / Self Care | Attending: Emergency Medicine | Admitting: Emergency Medicine

## 2015-09-05 ENCOUNTER — Encounter (HOSPITAL_COMMUNITY): Payer: Self-pay | Admitting: Emergency Medicine

## 2015-09-05 DIAGNOSIS — J189 Pneumonia, unspecified organism: Secondary | ICD-10-CM | POA: Diagnosis present

## 2015-09-05 DIAGNOSIS — Z9181 History of falling: Secondary | ICD-10-CM | POA: Diagnosis not present

## 2015-09-05 DIAGNOSIS — E785 Hyperlipidemia, unspecified: Secondary | ICD-10-CM | POA: Diagnosis present

## 2015-09-05 DIAGNOSIS — F17200 Nicotine dependence, unspecified, uncomplicated: Secondary | ICD-10-CM

## 2015-09-05 DIAGNOSIS — M546 Pain in thoracic spine: Secondary | ICD-10-CM | POA: Diagnosis not present

## 2015-09-05 DIAGNOSIS — I10 Essential (primary) hypertension: Secondary | ICD-10-CM | POA: Diagnosis present

## 2015-09-05 DIAGNOSIS — S22088D Other fracture of T11-T12 vertebra, subsequent encounter for fracture with routine healing: Secondary | ICD-10-CM | POA: Diagnosis not present

## 2015-09-05 DIAGNOSIS — Z880 Allergy status to penicillin: Secondary | ICD-10-CM

## 2015-09-05 DIAGNOSIS — Y92009 Unspecified place in unspecified non-institutional (private) residence as the place of occurrence of the external cause: Secondary | ICD-10-CM

## 2015-09-05 DIAGNOSIS — Z79899 Other long term (current) drug therapy: Secondary | ICD-10-CM

## 2015-09-05 DIAGNOSIS — Y9389 Activity, other specified: Secondary | ICD-10-CM | POA: Insufficient documentation

## 2015-09-05 DIAGNOSIS — A419 Sepsis, unspecified organism: Secondary | ICD-10-CM | POA: Diagnosis present

## 2015-09-05 DIAGNOSIS — E119 Type 2 diabetes mellitus without complications: Secondary | ICD-10-CM | POA: Diagnosis present

## 2015-09-05 DIAGNOSIS — R278 Other lack of coordination: Secondary | ICD-10-CM | POA: Diagnosis not present

## 2015-09-05 DIAGNOSIS — S22088A Other fracture of T11-T12 vertebra, initial encounter for closed fracture: Secondary | ICD-10-CM | POA: Diagnosis not present

## 2015-09-05 DIAGNOSIS — S3993XA Unspecified injury of pelvis, initial encounter: Secondary | ICD-10-CM | POA: Diagnosis not present

## 2015-09-05 DIAGNOSIS — E559 Vitamin D deficiency, unspecified: Secondary | ICD-10-CM | POA: Diagnosis present

## 2015-09-05 DIAGNOSIS — S3992XA Unspecified injury of lower back, initial encounter: Secondary | ICD-10-CM | POA: Diagnosis not present

## 2015-09-05 DIAGNOSIS — T148 Other injury of unspecified body region: Secondary | ICD-10-CM | POA: Insufficient documentation

## 2015-09-05 DIAGNOSIS — Z8249 Family history of ischemic heart disease and other diseases of the circulatory system: Secondary | ICD-10-CM | POA: Diagnosis not present

## 2015-09-05 DIAGNOSIS — Y998 Other external cause status: Secondary | ICD-10-CM

## 2015-09-05 DIAGNOSIS — J9601 Acute respiratory failure with hypoxia: Secondary | ICD-10-CM | POA: Diagnosis present

## 2015-09-05 DIAGNOSIS — S0990XA Unspecified injury of head, initial encounter: Secondary | ICD-10-CM | POA: Diagnosis not present

## 2015-09-05 DIAGNOSIS — W07XXXA Fall from chair, initial encounter: Secondary | ICD-10-CM | POA: Diagnosis present

## 2015-09-05 DIAGNOSIS — B9620 Unspecified Escherichia coli [E. coli] as the cause of diseases classified elsewhere: Secondary | ICD-10-CM | POA: Diagnosis present

## 2015-09-05 DIAGNOSIS — M810 Age-related osteoporosis without current pathological fracture: Secondary | ICD-10-CM

## 2015-09-05 DIAGNOSIS — J9611 Chronic respiratory failure with hypoxia: Secondary | ICD-10-CM | POA: Diagnosis not present

## 2015-09-05 DIAGNOSIS — N390 Urinary tract infection, site not specified: Secondary | ICD-10-CM | POA: Diagnosis present

## 2015-09-05 DIAGNOSIS — M545 Low back pain: Secondary | ICD-10-CM | POA: Diagnosis not present

## 2015-09-05 DIAGNOSIS — Z90710 Acquired absence of both cervix and uterus: Secondary | ICD-10-CM | POA: Diagnosis not present

## 2015-09-05 DIAGNOSIS — M549 Dorsalgia, unspecified: Secondary | ICD-10-CM | POA: Diagnosis present

## 2015-09-05 DIAGNOSIS — R2681 Unsteadiness on feet: Secondary | ICD-10-CM | POA: Diagnosis not present

## 2015-09-05 DIAGNOSIS — M6281 Muscle weakness (generalized): Secondary | ICD-10-CM | POA: Diagnosis not present

## 2015-09-05 DIAGNOSIS — Z881 Allergy status to other antibiotic agents status: Secondary | ICD-10-CM | POA: Diagnosis not present

## 2015-09-05 DIAGNOSIS — R0602 Shortness of breath: Secondary | ICD-10-CM | POA: Diagnosis not present

## 2015-09-05 DIAGNOSIS — M4854XA Collapsed vertebra, not elsewhere classified, thoracic region, initial encounter for fracture: Secondary | ICD-10-CM | POA: Diagnosis present

## 2015-09-05 DIAGNOSIS — R0902 Hypoxemia: Secondary | ICD-10-CM | POA: Diagnosis not present

## 2015-09-05 DIAGNOSIS — R102 Pelvic and perineal pain: Secondary | ICD-10-CM | POA: Diagnosis not present

## 2015-09-05 DIAGNOSIS — Z87448 Personal history of other diseases of urinary system: Secondary | ICD-10-CM | POA: Insufficient documentation

## 2015-09-05 DIAGNOSIS — T148XXA Other injury of unspecified body region, initial encounter: Secondary | ICD-10-CM

## 2015-09-05 DIAGNOSIS — W19XXXA Unspecified fall, initial encounter: Secondary | ICD-10-CM

## 2015-09-05 DIAGNOSIS — R6520 Severe sepsis without septic shock: Secondary | ICD-10-CM | POA: Diagnosis not present

## 2015-09-05 DIAGNOSIS — S300XXA Contusion of lower back and pelvis, initial encounter: Secondary | ICD-10-CM | POA: Diagnosis not present

## 2015-09-05 MED ORDER — HYDROCODONE-ACETAMINOPHEN 5-325 MG PO TABS
1.0000 | ORAL_TABLET | Freq: Once | ORAL | Status: AC
  Administered 2015-09-05: 1 via ORAL
  Filled 2015-09-05: qty 1

## 2015-09-05 MED ORDER — ONDANSETRON 4 MG PO TBDP
4.0000 mg | ORAL_TABLET | Freq: Once | ORAL | Status: AC
  Administered 2015-09-05: 4 mg via ORAL
  Filled 2015-09-05: qty 1

## 2015-09-05 NOTE — ED Notes (Signed)
Pt was found at approx. 8pm after being on the floor since approx. 10am. States that she slid out of her chair this morning and was unable to get back up. Only complaint is lower back pain on movement. Pt is also nauseated. Denies LOC. Alert and oriented.

## 2015-09-05 NOTE — ED Notes (Signed)
Bed: WA06 Expected date: 09/05/15 Expected time: 8:38 PM Means of arrival: Ambulance Comments: 81 yo F  Fall, been in floor for 10 hrs

## 2015-09-06 LAB — BASIC METABOLIC PANEL
Anion gap: 10 (ref 5–15)
BUN: 12 mg/dL (ref 6–20)
CHLORIDE: 105 mmol/L (ref 101–111)
CO2: 21 mmol/L — AB (ref 22–32)
CREATININE: 0.77 mg/dL (ref 0.44–1.00)
Calcium: 9.6 mg/dL (ref 8.9–10.3)
GFR calc Af Amer: >60 mL/min (ref >60)
GFR calc non Af Amer: >60 mL/min (ref >60)
GLUCOSE: 216 mg/dL — AB (ref 65–99)
Potassium: 3.9 mmol/L (ref 3.5–5.1)
SODIUM: 136 mmol/L (ref 135–145)

## 2015-09-06 LAB — CBC WITH DIFFERENTIAL/PLATELET
Basophils Absolute: 0 10*3/uL (ref 0.0–0.1)
Basophils Relative: 0 %
EOS ABS: 0 10*3/uL (ref 0.0–0.7)
EOS PCT: 0 %
HCT: 46.8 % — ABNORMAL HIGH (ref 36.0–46.0)
Hemoglobin: 16.2 g/dL — ABNORMAL HIGH (ref 12.0–15.0)
LYMPHS ABS: 0.9 10*3/uL (ref 0.7–4.0)
LYMPHS PCT: 5 %
MCH: 31.1 pg (ref 26.0–34.0)
MCHC: 34.6 g/dL (ref 30.0–36.0)
MCV: 89.8 fL (ref 78.0–100.0)
MONO ABS: 0.7 10*3/uL (ref 0.1–1.0)
Monocytes Relative: 4 %
Neutro Abs: 14.5 10*3/uL — ABNORMAL HIGH (ref 1.7–7.7)
Neutrophils Relative %: 91 %
PLATELETS: 222 10*3/uL (ref 150–400)
RBC: 5.21 MIL/uL — ABNORMAL HIGH (ref 3.87–5.11)
RDW: 12.7 % (ref 11.5–15.5)
WBC: 16 10*3/uL — ABNORMAL HIGH (ref 4.0–10.5)

## 2015-09-06 NOTE — ED Notes (Signed)
MD at bedside. 

## 2015-09-06 NOTE — ED Notes (Signed)
Patient transported to X-ray 

## 2015-09-06 NOTE — ED Notes (Signed)
Patient was alert, oriented and stable upon discharge. RN went over AVS and patient had no further questions.  

## 2015-09-06 NOTE — Discharge Instructions (Signed)
Fall Prevention in the Home  Falls can cause injuries and can affect people from all age groups. There are many simple things that you can do to make your home safe and to help prevent falls. WHAT CAN I DO ON THE OUTSIDE OF MY HOME?  Regularly repair the edges of walkways and driveways and fix any cracks.  Remove high doorway thresholds.  Trim any shrubbery on the main path into your home.  Use bright outdoor lighting.  Clear walkways of debris and clutter, including tools and rocks.  Regularly check that handrails are securely fastened and in good repair. Both sides of any steps should have handrails.  Install guardrails along the edges of any raised decks or porches.  Have leaves, snow, and ice cleared regularly.  Use sand or salt on walkways during winter months.  In the garage, clean up any spills right away, including grease or oil spills. WHAT CAN I DO IN THE BATHROOM?  Use night lights.  Install grab bars by the toilet and in the tub and shower. Do not use towel bars as grab bars.  Use non-skid mats or decals on the floor of the tub or shower.  If you need to sit down while you are in the shower, use a plastic, non-slip stool..  Keep the floor dry. Immediately clean up any water that spills on the floor.  Remove soap buildup in the tub or shower on a regular basis.  Attach bath mats securely with double-sided non-slip rug tape.  Remove throw rugs and other tripping hazards from the floor. WHAT CAN I DO IN THE BEDROOM?  Use night lights.  Make sure that a bedside light is easy to reach.  Do not use oversized bedding that drapes onto the floor.  Have a firm chair that has side arms to use for getting dressed.  Remove throw rugs and other tripping hazards from the floor. WHAT CAN I DO IN THE KITCHEN?   Clean up any spills right away.  Avoid walking on wet floors.  Place frequently used items in easy-to-reach places.  If you need to reach for something  above you, use a sturdy step stool that has a grab bar.  Keep electrical cables out of the way.  Do not use floor polish or wax that makes floors slippery. If you have to use wax, make sure that it is non-skid floor wax.  Remove throw rugs and other tripping hazards from the floor. WHAT CAN I DO IN THE STAIRWAYS?  Do not leave any items on the stairs.  Make sure that there are handrails on both sides of the stairs. Fix handrails that are broken or loose. Make sure that handrails are as long as the stairways.  Check any carpeting to make sure that it is firmly attached to the stairs. Fix any carpet that is loose or worn.  Avoid having throw rugs at the top or bottom of stairways, or secure the rugs with carpet tape to prevent them from moving.  Make sure that you have a light switch at the top of the stairs and the bottom of the stairs. If you do not have them, have them installed. WHAT ARE SOME OTHER FALL PREVENTION TIPS?  Wear closed-toe shoes that fit well and support your feet. Wear shoes that have rubber soles or low heels.  When you use a stepladder, make sure that it is completely opened and that the sides are firmly locked. Have someone hold the ladder while you   are using it. Do not climb a closed stepladder.  Add color or contrast paint or tape to grab bars and handrails in your home. Place contrasting color strips on the first and last steps.  Use mobility aids as needed, such as canes, walkers, scooters, and crutches.  Turn on lights if it is dark. Replace any light bulbs that burn out.  Set up furniture so that there are clear paths. Keep the furniture in the same spot.  Fix any uneven floor surfaces.  Choose a carpet design that does not hide the edge of steps of a stairway.  Be aware of any and all pets.  Review your medicines with your healthcare provider. Some medicines can cause dizziness or changes in blood pressure, which increase your risk of falling. Talk  with your health care provider about other ways that you can decrease your risk of falls. This may include working with a physical therapist or trainer to improve your strength, balance, and endurance.   This information is not intended to replace advice given to you by your health care provider. Make sure you discuss any questions you have with your health care provider.   Document Released: 09/06/2002 Document Revised: 01/31/2015 Document Reviewed: 10/21/2014 Elsevier Interactive Patient Education 2016 [employer]  

## 2015-09-06 NOTE — ED Provider Notes (Signed)
CSN: [id number]     Arrival date & time 09/05/15  2101 History   First MD Initiated Contact with Patient 09/05/15 2145     Chief Complaint  Patient presents with  . Fall     (Consider location/radiation/quality/duration/timing/severity/associated sxs/prior Treatment) HPI Comments: Pt slid out of a chair and laid on the floor unable to call for help for multiple hours today unable to lift herself up.  Patient is a 81 y.o. female presenting with fall. The history is provided by the patient and a relative.  Fall This is a new problem. The current episode started 12 to 24 hours ago. The problem occurs constantly. The problem has not changed since onset.Pertinent negatives include no abdominal pain. Associated symptoms comments: Back pain. Nothing aggravates the symptoms. Nothing relieves the symptoms. She has tried nothing for the symptoms.    Past Medical History  Diagnosis Date  . Diabetes mellitus without complication ([place])   . Benign hypertension   . Hyperlipidemia   . Vitamin D deficiency   . Overactive bladder   . Osteoporosis 2010  . History of complete eye exam 12/25/2012    Normal eye exam, no retinopathy [facility]  . Edema     Left pedal   . Vitamin D deficiency    Past Surgical History  Procedure Laterality Date  . Abdominal hysterectomy  1975    partial   Family History  Problem Relation Age of Onset  . Pancreatic cancer Mother   . Glaucoma Mother   . Hypertension Father   . Glaucoma Maternal Aunt   . Hypertension Maternal Grandmother   . Glaucoma Maternal Grandfather   . Hypertension Paternal Grandmother   . Hypertension Paternal Grandfather   . Glaucoma Maternal Aunt    Social History  Substance Use Topics  . Smoking status: Current Every Day Smoker  . Smokeless tobacco: None  . Alcohol Use: Yes   OB History    No data available     Review of Systems  Gastrointestinal: Negative for abdominal pain.  All other systems reviewed and are  negative.     Allergies  Lipitor and Penicillins  Home Medications   Prior to Admission medications   Medication Sig Start Date End Date Taking? Authorizing Provider  amLODipine (NORVASC) 5 MG tablet Take 5 mg by mouth daily.   Yes Historical Provider, MD  atorvastatin (LIPITOR) 20 MG tablet Take 20 mg by mouth daily at 6 PM.   Yes Historical Provider, MD  Cholecalciferol (VITAMIN D3) 2000 UNITS capsule Take 4,000 Units by mouth daily.   Yes Historical Provider, MD  fosinopril (MONOPRIL) 20 MG tablet Take 20 mg by mouth daily. 09/01/15  Yes Historical Provider, MD  ibuprofen (ADVIL,MOTRIN) 200 MG tablet Take 400 mg by mouth every 8 (eight) hours as needed for headache or moderate pain.   Yes Historical Provider, MD  Pioglitazone HCl-Metformin HCl (ACTOPLUS MET XR) 30-1000 MG TB24 Take 1 tablet by mouth daily.   Yes Historical Provider, MD  sodium chloride (OCEAN) 0.65 % nasal spray Place 1 spray into the nose at bedtime as needed for congestion.   Yes Historical Provider, MD   BP 214/83 mmHg  Pulse 57  Temp(Src) 97.9 F (36.6 C) (Oral)  Resp 16  SpO2 92% Physical Exam  Constitutional: She is oriented to person, place, and time. She appears well-developed and well-nourished. No distress.  HENT:  Head: Normocephalic.  Eyes: Conjunctivae are normal.  Neck: Neck supple. No tracheal deviation present.  Cardiovascular: Normal  rate, regular rhythm and normal heart sounds.   Pulmonary/Chest: Effort normal and breath sounds normal. No respiratory distress. She exhibits no tenderness.  Abdominal: Soft. She exhibits no distension. There is no tenderness.  Musculoskeletal: Normal range of motion.       Lumbar back: She exhibits tenderness.  Neurological: She is alert and oriented to person, place, and time.  Skin: Skin is warm and dry.  Psychiatric: She has a normal mood and affect.    ED Course  Procedures (including critical care time) Labs Review Labs Reviewed  CBC WITH  DIFFERENTIAL/PLATELET - Abnormal; Notable for the following:    WBC 16.0 (*)    RBC 5.21 (*)    Hemoglobin 16.2 (*)    HCT 46.8 (*)    Neutro Abs 14.5 (*)    All other components within normal limits  BASIC METABOLIC PANEL - Abnormal; Notable for the following:    CO2 21 (*)    Glucose, Bld 216 (*)    All other components within normal limits    Imaging Review Dg Chest 2 View  09/06/2015  CLINICAL DATA:  Shortness of breath. Chest congestion. Nausea. [place] out of chair today. EXAM: CHEST  2 VIEW COMPARISON:  None. FINDINGS: The heart size and mediastinal contours are within normal limits. Mild linear opacity in left lower lung may be due to atelectasis or scarring. No evidence of pulmonary edema or consolidation. No evidence of pneumothorax or hemothorax. The visualized skeletal structures are unremarkable. IMPRESSION: Mild left lower lung atelectasis versus scarring. Electronically Signed   By: Earle Gell M.D.   On: 09/06/2015 00:19   Dg Lumbar Spine Complete  09/06/2015  CLINICAL DATA:  Status post fall out of chair, with lower back pain. Pain radiates to the buttocks. Initial encounter. EXAM: LUMBAR SPINE - COMPLETE 4+ VIEW COMPARISON:  Lumbar spine radiographs performed 05/15/2006, and MRI of the lumbar spine performed 05/26/2006 FINDINGS: There is no evidence of fracture or subluxation. Vertebral bodies demonstrate normal height and alignment. Intervertebral disc spaces are preserved. Mild facet disease is noted at the lower lumbar spine. The visualized bowel gas pattern is unremarkable in appearance; air and stool are noted within the colon. The sacroiliac joints are within normal limits. IMPRESSION: No evidence of fracture or subluxation along the lumbar spine. Electronically Signed   By: Garald Balding M.D.   On: 09/06/2015 00:18   Dg Pelvis 1-2 Views  09/06/2015  CLINICAL DATA:  Fall out of chair.  Pelvic pain.  Initial encounter. EXAM: PELVIS - 1-2 VIEW COMPARISON:  None. FINDINGS:  There is no evidence of pelvic fracture or diastasis. No pelvic bone lesions are seen. Generalized osteopenia noted. IMPRESSION: No pelvic fracture identified. Electronically Signed   By: Earle Gell M.D.   On: 09/06/2015 00:17   Ct Head Wo Contrast  09/06/2015  CLINICAL DATA:  Initial evaluation for acute trauma, fall. EXAM: CT HEAD WITHOUT CONTRAST TECHNIQUE: Contiguous axial images were obtained from the base of the skull through the vertex without intravenous contrast. COMPARISON:  Prior study from 09/01/2013. FINDINGS: Generalized cerebral atrophy, greatest within the parietal regions, similar to prior exam. Advanced chronic microvascular ischemic changes present within the periventricular and deep white matter. Vascular calcifications present within the carotid siphons bilaterally. No acute large vessel territory infarct. Mild blurring of the gray-white matter differentiation within the left temporal region favored to be related to motion artifact. No intracranial hemorrhage. No mass lesion, midline shift, or mass effect. No extra-axial fluid collection. Ventricular prominence  related to global parenchymal volume loss present without hydrocephalus. No scalp soft tissue abnormality. No acute abnormality about the orbits. Paranasal sinuses are grossly clear, although evaluation limited by motion. No mastoid effusion. Calvarium grossly intact. IMPRESSION: 1. No acute intracranial process. 2. Stable moderate to severe chronic small vessel ischemic disease with cerebral atrophy. Electronically Signed   By: Jeannine Boga M.D.   On: 09/06/2015 01:35   I have personally reviewed and evaluated these images and lab results as part of my medical decision-making.   EKG Interpretation None      MDM   Final diagnoses:  Fall from standing, initial encounter  Contusion   80 year old female presents after falling at home following a ground-level fall reportedly landing on her bottom and back with no  loss of consciousness. She did feel a little lightheaded on the way to the ground. She is hypertensive on arrival but has a known history of hypertension. CT of the head and screening x-rays were ordered to evaluate for acute injury. Plan will be to ambulate patient if x-rays are negative and attempt to get patient home tonight. Patient has a likely reactive leukocytosis from lying on the floor for multiple hours today after eating unable to help herself up. Care coordination is unavailable to help with home health or physical therapy currently. Screening radiology is negative and patient ambulates with a walker. Normally she walks with a cane. I discussed plan with the daughter is able to be with her in the short-term until able to establish follow-up with primary care physician.  Face-to-face consult placed for visitation after returning home to assess safety and help with possible equipment needs. No indication for inpatient placement currently.   Leo Grosser, MD 09/06/15 [phone]

## 2015-09-07 ENCOUNTER — Inpatient Hospital Stay (HOSPITAL_COMMUNITY): Payer: Medicare PPO

## 2015-09-07 ENCOUNTER — Emergency Department (HOSPITAL_COMMUNITY): Payer: Medicare PPO

## 2015-09-07 ENCOUNTER — Inpatient Hospital Stay (HOSPITAL_COMMUNITY)
Admission: EM | Admit: 2015-09-07 | Discharge: 2015-09-11 | DRG: 871 | Disposition: A | Discharge status: Skilled Nursing Facility | Payer: Medicare PPO | Attending: Internal Medicine | Admitting: Internal Medicine

## 2015-09-07 ENCOUNTER — Encounter (HOSPITAL_COMMUNITY): Payer: Self-pay | Admitting: *Deleted

## 2015-09-07 DIAGNOSIS — M549 Dorsalgia, unspecified: Secondary | ICD-10-CM | POA: Diagnosis present

## 2015-09-07 DIAGNOSIS — Z881 Allergy status to other antibiotic agents status: Secondary | ICD-10-CM | POA: Diagnosis not present

## 2015-09-07 DIAGNOSIS — M810 Age-related osteoporosis without current pathological fracture: Secondary | ICD-10-CM | POA: Diagnosis present

## 2015-09-07 DIAGNOSIS — F17200 Nicotine dependence, unspecified, uncomplicated: Secondary | ICD-10-CM | POA: Diagnosis present

## 2015-09-07 DIAGNOSIS — M546 Pain in thoracic spine: Secondary | ICD-10-CM

## 2015-09-07 DIAGNOSIS — E119 Type 2 diabetes mellitus without complications: Secondary | ICD-10-CM | POA: Diagnosis present

## 2015-09-07 DIAGNOSIS — D72829 Elevated white blood cell count, unspecified: Secondary | ICD-10-CM | POA: Diagnosis present

## 2015-09-07 DIAGNOSIS — I10 Essential (primary) hypertension: Secondary | ICD-10-CM | POA: Diagnosis present

## 2015-09-07 DIAGNOSIS — Z8249 Family history of ischemic heart disease and other diseases of the circulatory system: Secondary | ICD-10-CM

## 2015-09-07 DIAGNOSIS — A419 Sepsis, unspecified organism: Secondary | ICD-10-CM | POA: Diagnosis present

## 2015-09-07 DIAGNOSIS — W07XXXA Fall from chair, initial encounter: Secondary | ICD-10-CM | POA: Diagnosis present

## 2015-09-07 DIAGNOSIS — M4854XA Collapsed vertebra, not elsewhere classified, thoracic region, initial encounter for fracture: Secondary | ICD-10-CM | POA: Diagnosis present

## 2015-09-07 DIAGNOSIS — J189 Pneumonia, unspecified organism: Secondary | ICD-10-CM | POA: Diagnosis present

## 2015-09-07 DIAGNOSIS — B9620 Unspecified Escherichia coli [E. coli] as the cause of diseases classified elsewhere: Secondary | ICD-10-CM | POA: Diagnosis present

## 2015-09-07 DIAGNOSIS — W19XXXA Unspecified fall, initial encounter: Secondary | ICD-10-CM | POA: Diagnosis present

## 2015-09-07 DIAGNOSIS — S22080A Wedge compression fracture of T11-T12 vertebra, initial encounter for closed fracture: Secondary | ICD-10-CM | POA: Diagnosis present

## 2015-09-07 DIAGNOSIS — N390 Urinary tract infection, site not specified: Secondary | ICD-10-CM | POA: Diagnosis present

## 2015-09-07 DIAGNOSIS — Z90710 Acquired absence of both cervix and uterus: Secondary | ICD-10-CM

## 2015-09-07 DIAGNOSIS — E785 Hyperlipidemia, unspecified: Secondary | ICD-10-CM | POA: Diagnosis present

## 2015-09-07 DIAGNOSIS — J9601 Acute respiratory failure with hypoxia: Secondary | ICD-10-CM | POA: Diagnosis present

## 2015-09-07 DIAGNOSIS — W19XXXD Unspecified fall, subsequent encounter: Secondary | ICD-10-CM

## 2015-09-07 DIAGNOSIS — E559 Vitamin D deficiency, unspecified: Secondary | ICD-10-CM | POA: Diagnosis present

## 2015-09-07 LAB — CBC WITH DIFFERENTIAL/PLATELET
Basophils Absolute: 0 10*3/uL (ref 0.0–0.1)
Basophils Relative: 0 %
EOS PCT: 0 %
Eosinophils Absolute: 0 10*3/uL (ref 0.0–0.7)
HCT: 44.9 % (ref 36.0–46.0)
Hemoglobin: 15.5 g/dL — ABNORMAL HIGH (ref 12.0–15.0)
LYMPHS ABS: 1.3 10*3/uL (ref 0.7–4.0)
LYMPHS PCT: 9 %
MCH: 31.1 pg (ref 26.0–34.0)
MCHC: 34.5 g/dL (ref 30.0–36.0)
MCV: 90.2 fL (ref 78.0–100.0)
MONO ABS: 0.9 10*3/uL (ref 0.1–1.0)
MONOS PCT: 6 %
Neutro Abs: 12.2 10*3/uL — ABNORMAL HIGH (ref 1.7–7.7)
Neutrophils Relative %: 85 %
PLATELETS: 211 10*3/uL (ref 150–400)
RBC: 4.98 MIL/uL (ref 3.87–5.11)
RDW: 13.1 % (ref 11.5–15.5)
WBC: 14.4 10*3/uL — ABNORMAL HIGH (ref 4.0–10.5)

## 2015-09-07 LAB — URINE MICROSCOPIC-ADD ON

## 2015-09-07 LAB — TSH: TSH: 2.077 u[IU]/mL (ref 0.350–4.500)

## 2015-09-07 LAB — GLUCOSE, CAPILLARY: Glucose-Capillary: 199 mg/dL — ABNORMAL HIGH (ref 65–99)

## 2015-09-07 LAB — COMPREHENSIVE METABOLIC PANEL
ALT: 20 U/L (ref 14–54)
AST: 18 U/L (ref 15–41)
Albumin: 3.5 g/dL (ref 3.5–5.0)
Alkaline Phosphatase: 88 U/L (ref 38–126)
Anion gap: 10 (ref 5–15)
BILIRUBIN TOTAL: 2 mg/dL — AB (ref 0.3–1.2)
BUN: 15 mg/dL (ref 6–20)
CO2: 23 mmol/L (ref 22–32)
CREATININE: 0.79 mg/dL (ref 0.44–1.00)
Calcium: 9.7 mg/dL (ref 8.9–10.3)
Chloride: 105 mmol/L (ref 101–111)
Glucose, Bld: 235 mg/dL — ABNORMAL HIGH (ref 65–99)
POTASSIUM: 4 mmol/L (ref 3.5–5.1)
Sodium: 138 mmol/L (ref 135–145)
TOTAL PROTEIN: 7.1 g/dL (ref 6.5–8.1)

## 2015-09-07 LAB — I-STAT CG4 LACTIC ACID, ED
LACTIC ACID, VENOUS: 1.46 mmol/L (ref 0.5–2.0)
Lactic Acid, Venous: 1.52 mmol/L (ref 0.5–2.0)

## 2015-09-07 LAB — PROTIME-INR
INR: 1.15 (ref 0.00–1.49)
Prothrombin Time: 14.9 seconds (ref 11.6–15.2)

## 2015-09-07 LAB — URINALYSIS, ROUTINE W REFLEX MICROSCOPIC
BILIRUBIN URINE: NEGATIVE
Ketones, ur: NEGATIVE mg/dL
Leukocytes, UA: NEGATIVE
Nitrite: POSITIVE — AB
PROTEIN: 100 mg/dL — AB
Specific Gravity, Urine: 1.031 — ABNORMAL HIGH (ref 1.005–1.030)
pH: 5 (ref 5.0–8.0)

## 2015-09-07 MED ORDER — SODIUM CHLORIDE 0.9 % IV SOLN
Freq: Once | INTRAVENOUS | Status: AC
  Administered 2015-09-07: 17:00:00 via INTRAVENOUS

## 2015-09-07 MED ORDER — ENOXAPARIN SODIUM 40 MG/0.4ML ~~LOC~~ SOLN
40.0000 mg | SUBCUTANEOUS | Status: DC
  Administered 2015-09-07 – 2015-09-10 (×4): 40 mg via SUBCUTANEOUS
  Filled 2015-09-07 (×4): qty 0.4

## 2015-09-07 MED ORDER — SODIUM CHLORIDE 0.9 % IV SOLN
INTRAVENOUS | Status: AC
  Administered 2015-09-08: 75 mL/h via INTRAVENOUS

## 2015-09-07 MED ORDER — DEXTROSE 5 % IV SOLN
1.0000 g | INTRAVENOUS | Status: DC
  Administered 2015-09-08 – 2015-09-11 (×4): 1 g via INTRAVENOUS
  Filled 2015-09-07 (×4): qty 10

## 2015-09-07 MED ORDER — AMLODIPINE BESYLATE 5 MG PO TABS
5.0000 mg | ORAL_TABLET | Freq: Every day | ORAL | Status: DC
  Administered 2015-09-08 – 2015-09-11 (×4): 5 mg via ORAL
  Filled 2015-09-07 (×4): qty 1

## 2015-09-07 MED ORDER — SODIUM CHLORIDE 0.9 % IJ SOLN
3.0000 mL | Freq: Two times a day (BID) | INTRAMUSCULAR | Status: DC
  Administered 2015-09-09 – 2015-09-10 (×3): 3 mL via INTRAVENOUS

## 2015-09-07 MED ORDER — LISINOPRIL 20 MG PO TABS
20.0000 mg | ORAL_TABLET | Freq: Every day | ORAL | Status: DC
  Administered 2015-09-08 – 2015-09-11 (×4): 20 mg via ORAL
  Filled 2015-09-07 (×4): qty 1

## 2015-09-07 MED ORDER — ONDANSETRON HCL 4 MG/2ML IJ SOLN: 4.0000 mg | Freq: Four times a day (QID) | INTRAMUSCULAR | Status: DC | PRN

## 2015-09-07 MED ORDER — SALINE SPRAY 0.65 % NA SOLN: 1.0000 | Freq: Every evening | NASAL | Status: DC | PRN

## 2015-09-07 MED ORDER — ATORVASTATIN CALCIUM 20 MG PO TABS
20.0000 mg | ORAL_TABLET | Freq: Every day | ORAL | Status: DC
  Administered 2015-09-07 – 2015-09-11 (×5): 20 mg via ORAL
  Filled 2015-09-07: qty 2
  Filled 2015-09-07: qty 1
  Filled 2015-09-07: qty 2
  Filled 2015-09-07 (×4): qty 1
  Filled 2015-09-07 (×3): qty 2

## 2015-09-07 MED ORDER — DEXTROSE 5 % IV SOLN
500.0000 mg | Freq: Once | INTRAVENOUS | Status: AC
  Administered 2015-09-07: 500 mg via INTRAVENOUS
  Filled 2015-09-07: qty 500

## 2015-09-07 MED ORDER — MORPHINE SULFATE (PF) 2 MG/ML IV SOLN: 1.0000 mg | INTRAVENOUS | Status: DC | PRN

## 2015-09-07 MED ORDER — DEXTROSE 5 % IV SOLN
1.0000 g | Freq: Once | INTRAVENOUS | Status: AC
  Administered 2015-09-07: 1 g via INTRAVENOUS
  Filled 2015-09-07: qty 10

## 2015-09-07 MED ORDER — VITAMIN D3 25 MCG (1000 UNIT) PO TABS
4000.0000 [IU] | ORAL_TABLET | Freq: Every day | ORAL | Status: DC
  Administered 2015-09-08 – 2015-09-11 (×4): 4000 [IU] via ORAL
  Filled 2015-09-07 (×8): qty 4

## 2015-09-07 MED ORDER — ACETAMINOPHEN 650 MG RE SUPP: 650.0000 mg | Freq: Four times a day (QID) | RECTAL | Status: DC | PRN

## 2015-09-07 MED ORDER — HYDROCODONE-ACETAMINOPHEN 5-325 MG PO TABS
1.0000 | ORAL_TABLET | ORAL | Status: DC | PRN
  Administered 2015-09-09 – 2015-09-10 (×4): 2 via ORAL
  Filled 2015-09-07 (×2): qty 1
  Filled 2015-09-07 (×2): qty 2
  Filled 2015-09-07: qty 1
  Filled 2015-09-07: qty 2
  Filled 2015-09-07: qty 1

## 2015-09-07 MED ORDER — DEXTROSE 5 % IV SOLN
500.0000 mg | INTRAVENOUS | Status: DC
  Administered 2015-09-08 – 2015-09-11 (×4): 500 mg via INTRAVENOUS
  Filled 2015-09-07 (×4): qty 500

## 2015-09-07 MED ORDER — ACETAMINOPHEN 500 MG PO TABS
1000.0000 mg | ORAL_TABLET | Freq: Once | ORAL | Status: AC
  Administered 2015-09-07: 1000 mg via ORAL
  Filled 2015-09-07: qty 2

## 2015-09-07 MED ORDER — ONDANSETRON HCL 4 MG PO TABS: 4.0000 mg | ORAL_TABLET | Freq: Four times a day (QID) | ORAL | Status: DC | PRN

## 2015-09-07 MED ORDER — INSULIN ASPART 100 UNIT/ML ~~LOC~~ SOLN
0.0000 [IU] | Freq: Three times a day (TID) | SUBCUTANEOUS | Status: DC
Start: 2015-09-08 — End: 2015-09-11
  Administered 2015-09-08: 3 [IU] via SUBCUTANEOUS
  Administered 2015-09-08: 5 [IU] via SUBCUTANEOUS
  Administered 2015-09-08: 3 [IU] via SUBCUTANEOUS
  Administered 2015-09-09 (×2): 5 [IU] via SUBCUTANEOUS
  Administered 2015-09-09 – 2015-09-10 (×3): 3 [IU] via SUBCUTANEOUS
  Administered 2015-09-10 – 2015-09-11 (×2): 5 [IU] via SUBCUTANEOUS
  Administered 2015-09-11: 3 [IU] via SUBCUTANEOUS
  Administered 2015-09-11: 5 [IU] via SUBCUTANEOUS

## 2015-09-07 MED ORDER — ACETAMINOPHEN 325 MG PO TABS
650.0000 mg | ORAL_TABLET | Freq: Four times a day (QID) | ORAL | Status: DC | PRN
  Administered 2015-09-09: 650 mg via ORAL

## 2015-09-07 NOTE — H&P (Signed)
[facility] History and Physical  [name] CZY:[id number] DOB: 21-Dec-1933 DOA: 09/07/2015  Referring physician: EDP PCP: PROVIDER NOT IN SYSTEM   Chief Complaint: Back pain and confusion  HPI: Rachel Rangel is a 81 y.o. female with past medical history of diabetes mellitus, HTN and HOH came into the hospital with back pain and confusion. Patient sore started on 12/6 when she fell while she was trying to get out of her chair, her daughter brought her to the hospital, patient had x-rays done showed no acute fractures discharged home, her daughter stayed the last 2 nights with her, patient is having a lot of back pain which is restricting her movement. Pain does not radiate, worse when she takes deep breath or move around, and improved when she lays still. Her daughter also notices some confusion with "whistling" sounds. Patient is short of breath, the urine has foul mL. In the ED patient was found hypoxic with oxygen saturation of 86% on room air, required to 3 L of oxygen to keep sats above 91%, x-ray questions lingular pneumonia and her urinalysis consistent with UTI, patient admitted to the hospital for further evaluation.  Review of Systems:  Constitutional: negative for anorexia, fevers and sweats Eyes: negative for irritation, redness and visual disturbance Ears, nose, mouth, throat, and face: negative for earaches, epistaxis, nasal congestion and sore throat Respiratory: negative for cough, dyspnea on exertion, sputum and wheezing Cardiovascular: negative for chest pain, dyspnea, lower extremity edema, orthopnea, palpitations and syncope Gastrointestinal: negative for abdominal pain, constipation, diarrhea, melena, nausea and vomiting Genitourinary:negative for dysuria, frequency and hematuria Hematologic/lymphatic: negative for bleeding, easy bruising and lymphadenopathy Musculoskeletal:negative for arthralgias, muscle weakness and stiff joints Neurological: negative for  coordination problems, gait problems, headaches and weakness Endocrine: negative for diabetic symptoms including polydipsia, polyuria and weight loss Allergic/Immunologic: negative for anaphylaxis, hay fever and urticaria  Past Medical History  Diagnosis Date  . Diabetes mellitus without complication ([place])   . Benign hypertension   . Hyperlipidemia   . Vitamin D deficiency   . Overactive bladder   . Osteoporosis 2010  . History of complete eye exam 12/25/2012    Normal eye exam, no retinopathy [facility]  . Edema     Left pedal   . Vitamin D deficiency    Past Surgical History  Procedure Laterality Date  . Abdominal hysterectomy  1975    partial   Social History:   reports that she has been smoking.  She does not have any smokeless tobacco history on file. She reports that she drinks alcohol. Her drug history is not on file.  Allergies  Allergen Reactions  . Lipitor [Atorvastatin]     Intolerant to 40 MG due to myalgias, however tolerates 20 MG dose  . Penicillins     Can't recall---Per [facility] records states nausea Has patient had a PCN reaction causing immediate rash, facial/tongue/throat swelling, SOB or lightheadedness with hypotension: unknown Has patient had a PCN reaction causing severe rash involving mucus membranes or skin necrosis: unknown Has patient had a PCN reaction that required hospitalization unknown Has patient had a PCN reaction occurring within the last 10 years: unknown      Family History  Problem Relation Age of Onset  . Pancreatic cancer Mother   . Glaucoma Mother   . Hypertension Father   . Glaucoma Maternal Aunt   . Hypertension Maternal Grandmother   . Glaucoma Maternal Grandfather   . Hypertension Paternal Grandmother   . Hypertension Paternal Grandfather   .  Glaucoma Maternal Aunt      Prior to Admission medications   Medication Sig Start Date End Date Taking? Authorizing Provider  amLODipine (NORVASC) 5 MG tablet Take 5 mg by  mouth daily.   Yes Historical Provider, MD  atorvastatin (LIPITOR) 20 MG tablet Take 20 mg by mouth daily at 6 PM.   Yes Historical Provider, MD  Cholecalciferol (VITAMIN D3) 2000 UNITS capsule Take 4,000 Units by mouth daily.   Yes Historical Provider, MD  fosinopril (MONOPRIL) 20 MG tablet Take 20 mg by mouth daily. 09/01/15  Yes Historical Provider, MD  ibuprofen (ADVIL,MOTRIN) 200 MG tablet Take 400 mg by mouth every 8 (eight) hours as needed for headache or moderate pain.   Yes Historical Provider, MD  Pioglitazone HCl-Metformin HCl (ACTOPLUS MET XR) 30-1000 MG TB24 Take 1 tablet by mouth daily.   Yes Historical Provider, MD  sodium chloride (OCEAN) 0.65 % nasal spray Place 1 spray into the nose at bedtime as needed for congestion.   Yes Historical Provider, MD   Physical Exam: Filed Vitals:   09/07/15 1625 09/07/15 1730  BP: 184/103 128/96  Pulse: 67 60  Temp: 99.7 F (37.6 C)   Resp: 22 20   Constitutional: Oriented to person, place, and time. Well-developed and well-nourished. Cooperative.  Head: Normocephalic and atraumatic.  Nose: Nose normal.  Mouth/Throat: Uvula is midline, oropharynx is clear and moist and mucous membranes are normal.  Eyes: Conjunctivae and EOM are normal. Pupils are equal, round, and reactive to light.  Neck: Trachea normal and normal range of motion. Neck supple.  Cardiovascular: Normal rate, regular rhythm, S1 normal, S2 normal, normal heart sounds and intact distal pulses.   Pulmonary/Chest: Effort normal and breath sounds normal.  Abdominal: Soft. Bowel sounds are normal. There is no hepatosplenomegaly. There is no tenderness.  Musculoskeletal: Normal range of motion.  Neurological: Alert and oriented to person, place, and time. Has normal strength. No cranial nerve deficit or sensory deficit.  Skin: Skin is warm, dry and intact.  Psychiatric: Has a normal mood and affect. Speech is normal and behavior is normal.   Labs on Admission:  Basic Metabolic  Panel:  Recent Labs Lab 09/05/15 2342 09/07/15 1532  NA 136 138  K 3.9 4.0  CL 105 105  CO2 21* 23  GLUCOSE 216* 235*  BUN 12 15  CREATININE 0.77 0.79  CALCIUM 9.6 9.7   Liver Function Tests:  Recent Labs Lab 09/07/15 1532  AST 18  ALT 20  ALKPHOS 88  BILITOT 2.0*  PROT 7.1  ALBUMIN 3.5   No results for input(s): LIPASE, AMYLASE in the last 168 hours. No results for input(s): AMMONIA in the last 168 hours. CBC:  Recent Labs Lab 09/05/15 2342 09/07/15 1414  WBC 16.0* 14.4*  NEUTROABS 14.5* 12.2*  HGB 16.2* 15.5*  HCT 46.8* 44.9  MCV 89.8 90.2  PLT 222 211   Cardiac Enzymes: No results for input(s): CKTOTAL, CKMB, CKMBINDEX, TROPONINI in the last 168 hours.  BNP (last 3 results) No results for input(s): BNP in the last 8760 hours.  ProBNP (last 3 results) No results for input(s): PROBNP in the last 8760 hours.  CBG: No results for input(s): GLUCAP in the last 168 hours.  Radiological Exams on Admission: Dg Chest 2 View  09/07/2015  CLINICAL DATA:  Labored breathing.  Hypoxia. EXAM: CHEST  2 VIEW COMPARISON:  September 05, 2015. FINDINGS: Stable cardiomediastinal silhouette. No pneumothorax or pleural effusion is noted. Right lung is clear. Linear opacity  is seen in lingular region which is increased compared to prior exam, consistent with worsening subsegmental atelectasis or possibly pneumonia. Bony thorax is unremarkable. IMPRESSION: Mildly increased lingular subsegmental atelectasis or possibly pneumonia. Electronically Signed   By: Marijo Conception, M.D.   On: 09/07/2015 14:46   Dg Chest 2 View  09/06/2015  CLINICAL DATA:  Shortness of breath. Chest congestion. Nausea. [place] out of chair today. EXAM: CHEST  2 VIEW COMPARISON:  None. FINDINGS: The heart size and mediastinal contours are within normal limits. Mild linear opacity in left lower lung may be due to atelectasis or scarring. No evidence of pulmonary edema or consolidation. No evidence of pneumothorax  or hemothorax. The visualized skeletal structures are unremarkable. IMPRESSION: Mild left lower lung atelectasis versus scarring. Electronically Signed   By: Earle Gell M.D.   On: 09/06/2015 00:19   Dg Lumbar Spine Complete  09/06/2015  CLINICAL DATA:  Status post fall out of chair, with lower back pain. Pain radiates to the buttocks. Initial encounter. EXAM: LUMBAR SPINE - COMPLETE 4+ VIEW COMPARISON:  Lumbar spine radiographs performed 05/15/2006, and MRI of the lumbar spine performed 05/26/2006 FINDINGS: There is no evidence of fracture or subluxation. Vertebral bodies demonstrate normal height and alignment. Intervertebral disc spaces are preserved. Mild facet disease is noted at the lower lumbar spine. The visualized bowel gas pattern is unremarkable in appearance; air and stool are noted within the colon. The sacroiliac joints are within normal limits. IMPRESSION: No evidence of fracture or subluxation along the lumbar spine. Electronically Signed   By: Garald Balding M.D.   On: 09/06/2015 00:18   Dg Pelvis 1-2 Views  09/06/2015  CLINICAL DATA:  Fall out of chair.  Pelvic pain.  Initial encounter. EXAM: PELVIS - 1-2 VIEW COMPARISON:  None. FINDINGS: There is no evidence of pelvic fracture or diastasis. No pelvic bone lesions are seen. Generalized osteopenia noted. IMPRESSION: No pelvic fracture identified. Electronically Signed   By: Earle Gell M.D.   On: 09/06/2015 00:17   Ct Head Wo Contrast  09/06/2015  CLINICAL DATA:  Initial evaluation for acute trauma, fall. EXAM: CT HEAD WITHOUT CONTRAST TECHNIQUE: Contiguous axial images were obtained from the base of the skull through the vertex without intravenous contrast. COMPARISON:  Prior study from 09/01/2013. FINDINGS: Generalized cerebral atrophy, greatest within the parietal regions, similar to prior exam. Advanced chronic microvascular ischemic changes present within the periventricular and deep white matter. Vascular calcifications present within  the carotid siphons bilaterally. No acute large vessel territory infarct. Mild blurring of the gray-white matter differentiation within the left temporal region favored to be related to motion artifact. No intracranial hemorrhage. No mass lesion, midline shift, or mass effect. No extra-axial fluid collection. Ventricular prominence related to global parenchymal volume loss present without hydrocephalus. No scalp soft tissue abnormality. No acute abnormality about the orbits. Paranasal sinuses are grossly clear, although evaluation limited by motion. No mastoid effusion. Calvarium grossly intact. IMPRESSION: 1. No acute intracranial process. 2. Stable moderate to severe chronic small vessel ischemic disease with cerebral atrophy. Electronically Signed   By: Jeannine Boga M.D.   On: 09/06/2015 01:35    EKG: No EKG  Assessment/Plan Principal Problem:   Acute respiratory failure with hypoxia [facility]) Active Problems:   UTI (lower urinary tract infection)   Community acquired pneumonia   Back pain   Fall   Diabetes mellitus type 2, controlled, without complications ([place])   Leukocytosis   Sepsis ([place])    Acute respiratory failure  with hypoxia Came in with oxygen saturation of 86% and labored breathing. This is likely secondary to sepsis syndrome and community acquired pneumonia. Patient started on supplemental oxygen, treated pneumonia and sepsis syndrome.  Sepsis Met sepsis criteria with respiratory rate of 28, WBC of 14.4 and presence of infection (pneumonia/UTI). Patient is hemodynamically stable. Started on IV antibiotics will continue.  Community acquired pneumonia Has hypoxia, shortness of breath and labored breathing, started on Rocephin and azithromycin. Continue supportive management with bronchodilators, mucolytics, antitussives and oxygen as needed.  UTI Urinalysis consistent with UTI, patient is started Rocephin. Follow urine culture, will adjust antibiotics according to  the culture results.  Back pain Severe back pain, this is happened after the fall. Daughter at bedside reported that patient has always suffered from generalized weakness. This time what restricting her movement is not weakness but back pain. X-ray of lumbar spine was negative, I will check CT of lumbar spine and CT of the chest to see T-spine as well. Treat pain with narcotic pain medications.  Diabetes mellitus type 2 Carbohydrate modified diet, check hemoglobin A1c. Start SSI and hold oral hypoglycemics.  Leukocytosis Secondary to CAP/UTI and sepsis syndrome.  Code Status: Full code Family Communication: Discussed with the patient in the presence of her daughter at bedside. Disposition Plan: Telemetry  Time spent: 70 minutes  Hendryx Ricke A, MD [facility] Pager [phone]

## 2015-09-07 NOTE — ED Notes (Signed)
Bed: WA12 Expected date:  Expected time:  Means of arrival:  Comments: Ems 

## 2015-09-07 NOTE — ED Notes (Signed)
Pt seen here on Tuesday for a fall. Per pt she has been in bed for the past 2 days due to severe back pain from her fall. Pt arrived via EMS with family soiled in urine. Pt cleaned and changed.

## 2015-09-07 NOTE — ED Notes (Signed)
Pt placed on 3L [place] sating 92-94%.

## 2015-09-07 NOTE — ED Notes (Signed)
daugther reports that pt had increased confusion this morning, was answering questions inappropriately. Also was having increased labor with breathing, was "whistling". Pt has foul smelling urine. Pt does not wear oxygen at home.

## 2015-09-07 NOTE — Progress Notes (Signed)
Utilization Review completed.  Loraina Stauffer RN CM  

## 2015-09-07 NOTE — ED Provider Notes (Signed)
CSN: [id number]     Arrival date & time 09/07/15  1316 History   First MD Initiated Contact with Patient 09/07/15 1509     Chief Complaint  Patient presents with  . Increased Confusion   . Back Pain     (Consider location/radiation/quality/duration/timing/severity/associated sxs/prior Treatment) Patient is a 81 y.o. female presenting with general illness. The history is provided by the patient.  Illness Severity:  Moderate Onset quality:  Gradual Duration:  2 days Timing:  Constant Progression:  Worsening Chronicity:  New Associated symptoms: cough, fatigue and shortness of breath   Associated symptoms: no abdominal pain, no chest pain, no congestion, no fever, no headaches, no myalgias, no nausea, no rhinorrhea, no vomiting and no wheezing    81 yo F with a chief complaint of low back pain. This been going on for the past couple days. Patient fell on Tuesday had a CT scan of the lower back that was negative. Patient is continued to have pain in that area. Feels like it is getting worse. Now has had a lot of weakness associated with it as well. Mild cough denies congestion or fevers. Denies chest pain or shortness of breath. Denies dysuria. Family is concerned for some foul-smelling urine. Patient lives alone and family is concerned because she cannot take care of herself in her current condition.  Past Medical History  Diagnosis Date  . Diabetes mellitus without complication ([place])   . Benign hypertension   . Hyperlipidemia   . Vitamin D deficiency   . Overactive bladder   . Osteoporosis 2010  . History of complete eye exam 12/25/2012    Normal eye exam, no retinopathy [facility]  . Edema     Left pedal   . Vitamin D deficiency    Past Surgical History  Procedure Laterality Date  . Abdominal hysterectomy  1975    partial   Family History  Problem Relation Age of Onset  . Pancreatic cancer Mother   . Glaucoma Mother   . Hypertension Father   . Glaucoma Maternal  Aunt   . Hypertension Maternal Grandmother   . Glaucoma Maternal Grandfather   . Hypertension Paternal Grandmother   . Hypertension Paternal Grandfather   . Glaucoma Maternal Aunt    Social History  Substance Use Topics  . Smoking status: Current Every Day Smoker  . Smokeless tobacco: None  . Alcohol Use: Yes   OB History    No data available     Review of Systems  Constitutional: Positive for fatigue. Negative for fever and chills.  HENT: Negative for congestion and rhinorrhea.   Eyes: Negative for redness and visual disturbance.  Respiratory: Positive for cough and shortness of breath. Negative for wheezing.   Cardiovascular: Negative for chest pain and palpitations.  Gastrointestinal: Negative for nausea, vomiting and abdominal pain.  Genitourinary: Negative for dysuria and urgency.  Musculoskeletal: Positive for back pain. Negative for myalgias and arthralgias.  Skin: Negative for pallor and wound.  Neurological: Negative for dizziness and headaches.      Allergies  Lipitor and Penicillins  Home Medications   Prior to Admission medications   Medication Sig Start Date End Date Taking? Authorizing Provider  amLODipine (NORVASC) 5 MG tablet Take 5 mg by mouth daily.   Yes Historical Provider, MD  atorvastatin (LIPITOR) 20 MG tablet Take 20 mg by mouth daily at 6 PM.   Yes Historical Provider, MD  Cholecalciferol (VITAMIN D3) 2000 UNITS capsule Take 4,000 Units by mouth daily.  Yes Historical Provider, MD  fosinopril (MONOPRIL) 20 MG tablet Take 20 mg by mouth daily. 09/01/15  Yes Historical Provider, MD  ibuprofen (ADVIL,MOTRIN) 200 MG tablet Take 400 mg by mouth every 8 (eight) hours as needed for headache or moderate pain.   Yes Historical Provider, MD  Pioglitazone HCl-Metformin HCl (ACTOPLUS MET XR) 30-1000 MG TB24 Take 1 tablet by mouth daily.   Yes Historical Provider, MD  sodium chloride (OCEAN) 0.65 % nasal spray Place 1 spray into the nose at bedtime as needed  for congestion.   Yes Historical Provider, MD   BP 184/70 mmHg  Pulse 64  Temp(Src) 98 F (36.7 C) (Oral)  Resp 22  Ht $R'5\' 2"'Sl$  (1.575 m)  Wt 180 lb (81.647 kg)  BMI 32.91 kg/m2  SpO2 94% Physical Exam  Constitutional: She is oriented to person, place, and time. She appears well-developed and well-nourished. No distress.  HENT:  Head: Normocephalic and atraumatic.  Eyes: EOM are normal. Pupils are equal, round, and reactive to light.  Neck: Normal range of motion. Neck supple.  Cardiovascular: Normal rate and regular rhythm.  Exam reveals no gallop and no friction rub.   No murmur heard. Pulmonary/Chest: Effort normal. She has wheezes (physical wheezes and rhonchi to the left lower lung field). She has no rales.  Abdominal: Soft. She exhibits no distension. There is no tenderness. There is no rebound and no guarding.  Musculoskeletal: She exhibits tenderness (to the L spine about L3-L4 ). She exhibits no edema.  Neurological: She is alert and oriented to person, place, and time.  Skin: Skin is warm and dry. She is not diaphoretic.  Psychiatric: She has a normal mood and affect. Her behavior is normal.  Nursing note and vitals reviewed.   ED Course  Procedures (including critical care time) Labs Review Labs Reviewed  CBC WITH DIFFERENTIAL/PLATELET - Abnormal; Notable for the following:    WBC 14.4 (*)    Hemoglobin 15.5 (*)    Neutro Abs 12.2 (*)    All other components within normal limits  URINALYSIS, ROUTINE W REFLEX MICROSCOPIC (NOT AT [facility]) - Abnormal; Notable for the following:    Color, Urine AMBER (*)    APPearance CLOUDY (*)    Specific Gravity, Urine 1.031 (*)    Glucose, UA >1000 (*)    Hgb urine dipstick MODERATE (*)    Protein, ur 100 (*)    Nitrite POSITIVE (*)    All other components within normal limits  COMPREHENSIVE METABOLIC PANEL - Abnormal; Notable for the following:    Glucose, Bld 235 (*)    Total Bilirubin 2.0 (*)    All other components within  normal limits  URINE MICROSCOPIC-ADD ON - Abnormal; Notable for the following:    Squamous Epithelial / LPF 0-5 (*)    Bacteria, UA MANY (*)    Casts HYALINE CASTS (*)    All other components within normal limits  GLUCOSE, CAPILLARY - Abnormal; Notable for the following:    Glucose-Capillary 199 (*)    All other components within normal limits  URINE CULTURE  CULTURE, BLOOD (ROUTINE X 2)  CULTURE, BLOOD (ROUTINE X 2)  PROTIME-INR  TSH  HEMOGLOBIN S5K  BASIC METABOLIC PANEL  CBC  I-STAT CG4 LACTIC ACID, ED  I-STAT CG4 LACTIC ACID, ED    Imaging Review Dg Chest 2 View  09/07/2015  CLINICAL DATA:  Labored breathing.  Hypoxia. EXAM: CHEST  2 VIEW COMPARISON:  September 05, 2015. FINDINGS: Stable cardiomediastinal silhouette. No pneumothorax  or pleural effusion is noted. Right lung is clear. Linear opacity is seen in lingular region which is increased compared to prior exam, consistent with worsening subsegmental atelectasis or possibly pneumonia. Bony thorax is unremarkable. IMPRESSION: Mildly increased lingular subsegmental atelectasis or possibly pneumonia. Electronically Signed   By: Marijo Conception, M.D.   On: 09/07/2015 14:46   Dg Chest 2 View  09/06/2015  CLINICAL DATA:  Shortness of breath. Chest congestion. Nausea. [place] out of chair today. EXAM: CHEST  2 VIEW COMPARISON:  None. FINDINGS: The heart size and mediastinal contours are within normal limits. Mild linear opacity in left lower lung may be due to atelectasis or scarring. No evidence of pulmonary edema or consolidation. No evidence of pneumothorax or hemothorax. The visualized skeletal structures are unremarkable. IMPRESSION: Mild left lower lung atelectasis versus scarring. Electronically Signed   By: Earle Gell M.D.   On: 09/06/2015 00:19   Dg Lumbar Spine Complete  09/06/2015  CLINICAL DATA:  Status post fall out of chair, with lower back pain. Pain radiates to the buttocks. Initial encounter. EXAM: LUMBAR SPINE - COMPLETE  4+ VIEW COMPARISON:  Lumbar spine radiographs performed 05/15/2006, and MRI of the lumbar spine performed 05/26/2006 FINDINGS: There is no evidence of fracture or subluxation. Vertebral bodies demonstrate normal height and alignment. Intervertebral disc spaces are preserved. Mild facet disease is noted at the lower lumbar spine. The visualized bowel gas pattern is unremarkable in appearance; air and stool are noted within the colon. The sacroiliac joints are within normal limits. IMPRESSION: No evidence of fracture or subluxation along the lumbar spine. Electronically Signed   By: Garald Balding M.D.   On: 09/06/2015 00:18   Dg Pelvis 1-2 Views  09/06/2015  CLINICAL DATA:  Fall out of chair.  Pelvic pain.  Initial encounter. EXAM: PELVIS - 1-2 VIEW COMPARISON:  None. FINDINGS: There is no evidence of pelvic fracture or diastasis. No pelvic bone lesions are seen. Generalized osteopenia noted. IMPRESSION: No pelvic fracture identified. Electronically Signed   By: Earle Gell M.D.   On: 09/06/2015 00:17   Ct Head Wo Contrast  09/06/2015  CLINICAL DATA:  Initial evaluation for acute trauma, fall. EXAM: CT HEAD WITHOUT CONTRAST TECHNIQUE: Contiguous axial images were obtained from the base of the skull through the vertex without intravenous contrast. COMPARISON:  Prior study from 09/01/2013. FINDINGS: Generalized cerebral atrophy, greatest within the parietal regions, similar to prior exam. Advanced chronic microvascular ischemic changes present within the periventricular and deep white matter. Vascular calcifications present within the carotid siphons bilaterally. No acute large vessel territory infarct. Mild blurring of the gray-white matter differentiation within the left temporal region favored to be related to motion artifact. No intracranial hemorrhage. No mass lesion, midline shift, or mass effect. No extra-axial fluid collection. Ventricular prominence related to global parenchymal volume loss present  without hydrocephalus. No scalp soft tissue abnormality. No acute abnormality about the orbits. Paranasal sinuses are grossly clear, although evaluation limited by motion. No mastoid effusion. Calvarium grossly intact. IMPRESSION: 1. No acute intracranial process. 2. Stable moderate to severe chronic small vessel ischemic disease with cerebral atrophy. Electronically Signed   By: Jeannine Boga M.D.   On: 09/06/2015 01:35   I have personally reviewed and evaluated these images and lab results as part of my medical decision-making.   EKG Interpretation None      MDM   Final diagnoses:  Back pain  PNA (pneumonia)  Back pain  PNA (pneumonia)    81 yo  F with a chief complaint of low back pain.Patient arrived hypoxic on room air.Chest x-ray concerning for left lower lobe pneumonia. Suspect patient not taking deep breaths with a low back pain is the cause. Will treat for community acquired pneumonia.  Admit.   The patients results and plan were reviewed and discussed.   Any x-rays performed were independently reviewed by myself.   Differential diagnosis were considered with the presenting HPI.  Medications  amLODipine (NORVASC) tablet 5 mg (not administered)  atorvastatin (LIPITOR) tablet 20 mg (20 mg Oral Given 09/07/15 2015)  cholecalciferol (VITAMIN D) tablet 4,000 Units (not administered)  sodium chloride (OCEAN) 0.65 % nasal spray 1 spray (not administered)  lisinopril (PRINIVIL,ZESTRIL) tablet 20 mg (not administered)  sodium chloride 0.9 % injection 3 mL (3 mLs Intravenous Not Given 09/07/15 2200)  0.9 %  sodium chloride infusion (75 mL/hr Intravenous Transfusing/Transfer 09/07/15 1858)  acetaminophen (TYLENOL) tablet 650 mg (not administered)    Or  acetaminophen (TYLENOL) suppository 650 mg (not administered)  HYDROcodone-acetaminophen (NORCO/VICODIN) 5-325 MG per tablet 1-2 tablet (not administered)  morphine 2 MG/ML injection 1 mg (not administered)  ondansetron (ZOFRAN)  tablet 4 mg (not administered)    Or  ondansetron (ZOFRAN) injection 4 mg (not administered)  insulin aspart (novoLOG) injection 0-15 Units (not administered)  enoxaparin (LOVENOX) injection 40 mg (40 mg Subcutaneous Given 09/07/15 2015)  cefTRIAXone (ROCEPHIN) 1 g in dextrose 5 % 50 mL IVPB (not administered)  azithromycin (ZITHROMAX) 500 mg in dextrose 5 % 250 mL IVPB (not administered)  acetaminophen (TYLENOL) tablet 1,000 mg (1,000 mg Oral Given 09/07/15 1634)  cefTRIAXone (ROCEPHIN) 1 g in dextrose 5 % 50 mL IVPB (0 g Intravenous Stopped 09/07/15 1703)  azithromycin (ZITHROMAX) 500 mg in dextrose 5 % 250 mL IVPB (0 mg Intravenous Stopped 09/07/15 1755)  0.9 %  sodium chloride infusion ( Intravenous New Bag/Given 09/07/15 1648)  0.9 %  sodium chloride infusion ( Intravenous New Bag/Given 09/07/15 1648)    Filed Vitals:   09/07/15 1647 09/07/15 1730 09/07/15 1828 09/07/15 2122  BP:  128/96 171/91 184/70  Pulse:  60 64 64  Temp:   97.5 F (36.4 C) 98 F (36.7 C)  TempSrc:   Oral Oral  Resp:  _0 Height:      Weight: 180 lb (81.647 kg)     SpO2:  94% 96% 94%    Final diagnoses:  Back pain  PNA (pneumonia)  Back pain  PNA (pneumonia)    Admission/ observation were discussed with the admitting physician, patient and/or family and they are comfortable with the plan.    Deno Etienne, DO 09/08/15 0005

## 2015-09-08 DIAGNOSIS — M4854XA Collapsed vertebra, not elsewhere classified, thoracic region, initial encounter for fracture: Secondary | ICD-10-CM

## 2015-09-08 DIAGNOSIS — S22080A Wedge compression fracture of T11-T12 vertebra, initial encounter for closed fracture: Secondary | ICD-10-CM | POA: Diagnosis present

## 2015-09-08 LAB — BASIC METABOLIC PANEL
Anion gap: 7 (ref 5–15)
BUN: 16 mg/dL (ref 6–20)
CHLORIDE: 102 mmol/L (ref 101–111)
CO2: 27 mmol/L (ref 22–32)
CREATININE: 0.85 mg/dL (ref 0.44–1.00)
Calcium: 9.5 mg/dL (ref 8.9–10.3)
GFR calc non Af Amer: >60 mL/min (ref >60)
Glucose, Bld: 204 mg/dL — ABNORMAL HIGH (ref 65–99)
POTASSIUM: 4.2 mmol/L (ref 3.5–5.1)
Sodium: 136 mmol/L (ref 135–145)

## 2015-09-08 LAB — GLUCOSE, CAPILLARY
Glucose-Capillary: 191 mg/dL — ABNORMAL HIGH (ref 65–99)
Glucose-Capillary: 172 mg/dL — ABNORMAL HIGH (ref 65–99)
Glucose-Capillary: 211 mg/dL — ABNORMAL HIGH (ref 65–99)
Glucose-Capillary: 211 mg/dL — ABNORMAL HIGH (ref 65–99)

## 2015-09-08 LAB — HEMOGLOBIN A1C
Hgb A1c MFr Bld: 7.6 % — ABNORMAL HIGH (ref 4.8–5.6)
Mean Plasma Glucose: 171 mg/dL

## 2015-09-08 LAB — CBC
HEMATOCRIT: 41.6 % (ref 36.0–46.0)
HEMOGLOBIN: 14.6 g/dL (ref 12.0–15.0)
MCH: 32 pg (ref 26.0–34.0)
MCHC: 35.1 g/dL (ref 30.0–36.0)
MCV: 91.2 fL (ref 78.0–100.0)
Platelets: 191 10*3/uL (ref 150–400)
RBC: 4.56 MIL/uL (ref 3.87–5.11)
RDW: 13 % (ref 11.5–15.5)
WBC: 11.5 10*3/uL — ABNORMAL HIGH (ref 4.0–10.5)

## 2015-09-08 MED ORDER — HYDRALAZINE HCL 20 MG/ML IJ SOLN
10.0000 mg | Freq: Four times a day (QID) | INTRAMUSCULAR | Status: DC | PRN
  Administered 2015-09-08 – 2015-09-11 (×4): 10 mg via INTRAVENOUS
  Filled 2015-09-08 (×4): qty 1

## 2015-09-08 NOTE — Progress Notes (Signed)
[facility] PROGRESS NOTE   Rachel Rangel JYN:[id number] DOB: [birth year] DOA: 09/07/2015 PCP: PROVIDER NOT IN SYSTEM  HPI/Subjective: Feels better than yesterday, still has severe back pain. PT/OT to evaluate and treat. Patient probably going to need nursing home.  Assessment/Plan: Principal Problem:   Acute respiratory failure with hypoxia (HCC) Active Problems:   UTI (lower urinary tract infection)   Community acquired pneumonia   Back pain   Fall   Diabetes mellitus type 2, controlled, without complications ([place])   Leukocytosis   Sepsis ([place])   T12 compression fracture ([place])   Acute respiratory failure with hypoxia Came in with oxygen saturation of 86% and labored breathing. This is likely secondary to sepsis syndrome and community acquired pneumonia. Patient started on supplemental oxygen, treated pneumonia and sepsis syndrome.  Sepsis Met sepsis criteria with respiratory rate of 28, WBC of 14.4 and presence of infection (pneumonia/UTI). Patient is hemodynamically stable. Started on IV antibiotics will continue.  Community acquired pneumonia Has hypoxia, shortness of breath and labored breathing, started on Rocephin and azithromycin. Continue supportive management with bronchodilators, mucolytics, antitussives and oxygen as needed.  Escherichia coli UTI Urinalysis consistent with UTI, patient is started Rocephin. Follow urine culture, will adjust antibiotics according to the culture results.  T12 compression fracture Severe back pain, this is happened after the fall. CT scan showed subacute to acute T12 compression fracture with 40% loss of height. Discussed with patient and daughter about conservative management versus kyphoplasty/vertebroplasty. Patient and her daughter chose conservative management with early ambulation and treatment of the pain.  Diabetes mellitus type 2 Carbohydrate modified diet, hemoglobin A1c is 7.6 correlate with many plasma  glucose of 171. Started on SSI and hold oral hypoglycemics.  Leukocytosis Secondary to CAP/UTI and sepsis syndrome.  Code Status: Full Code Family Communication: Plan discussed with the patient. Disposition Plan: Remains inpatient Diet: Diet Carb Modified Fluid consistency:: Thin; Room service appropriate?: Yes  Consultants:  None  Procedures:  None  Antibiotics:  None   Objective: Filed Vitals:   09/08/15 0509 09/08/15 1328  BP: 173/85 194/82  Pulse: 71 71  Temp: 97.8 F (36.6 C) 98.8 F (37.1 C)  Resp: 20 20    Intake/Output Summary (Last 24 hours) at 09/08/15 1639 Last data filed at 09/08/15 1245  Gross per 24 hour  Intake    600 ml  Output      0 ml  Net    600 ml   Filed Weights   09/07/15 1336 09/07/15 1647  Weight: 81.647 kg (180 lb) 81.647 kg (180 lb)    Exam: General: Alert and awake, oriented x3, not in any acute distress. HEENT: anicteric sclera, pupils reactive to light and accommodation, EOMI CVS: S1-S2 clear, no murmur rubs or gallops Chest: clear to auscultation bilaterally, no wheezing, rales or rhonchi Abdomen: soft nontender, nondistended, normal bowel sounds, no organomegaly Extremities: no cyanosis, clubbing or edema noted bilaterally Neuro: Cranial nerves II-XII intact, no focal neurological deficits  Data Reviewed: Basic Metabolic Panel:  Recent Labs Lab 09/05/15 2342 09/07/15 1532 09/08/15 0450  NA 136 138 136  K 3.9 4.0 4.2  CL 105 105 102  CO2 21* 23 27  GLUCOSE 216* 235* 204*  BUN _0 CREATININE 0.77 0.79 0.85  CALCIUM 9.6 9.7 9.5   Liver Function Tests:  Recent Labs Lab 09/07/15 1532  AST 18  ALT 20  ALKPHOS 88  BILITOT 2.0*  PROT 7.1  ALBUMIN 3.5   No results for input(s): LIPASE,  AMYLASE in the last 168 hours. No results for input(s): AMMONIA in the last 168 hours. CBC:  Recent Labs Lab 09/05/15 2342 09/07/15 1414 09/08/15 0450  WBC 16.0* 14.4* 11.5*  NEUTROABS 14.5* 12.2*  --   HGB  16.2* 15.5* 14.6  HCT 46.8* 44.9 41.6  MCV 89.8 90.2 91.2  PLT 222 211 191   Cardiac Enzymes: No results for input(s): CKTOTAL, CKMB, CKMBINDEX, TROPONINI in the last 168 hours. BNP (last 3 results) No results for input(s): BNP in the last 8760 hours.  ProBNP (last 3 results) No results for input(s): PROBNP in the last 8760 hours.  CBG:  Recent Labs Lab 09/07/15 2121 09/08/15 0726 09/08/15 1151 09/08/15 1603  GLUCAP 199* 191* 172* 211*    Micro Recent Results (from the past 240 hour(s))  Blood culture (routine x 2)     Status: None (Preliminary result)   Collection Time: 09/07/15  2:10 PM  Result Value Ref Range Status   Specimen Description BLOOD LEFT HAND  Final   Special Requests BOTTLES DRAWN AEROBIC AND ANAEROBIC 5ML  Final   Culture   Final    NO GROWTH < 24 HOURS Performed at [facility]    Report Status PENDING  Incomplete  Blood culture (routine x 2)     Status: None (Preliminary result)   Collection Time: 09/07/15  3:30 PM  Result Value Ref Range Status   Specimen Description BLOOD RIGHT HAND  Final   Special Requests BOTTLES DRAWN AEROBIC AND ANAEROBIC 5CC  Final   Culture   Final    NO GROWTH < 24 HOURS Performed at [facility]    Report Status PENDING  Incomplete  Urine culture     Status: None (Preliminary result)   Collection Time: 09/07/15  4:07 PM  Result Value Ref Range Status   Specimen Description URINE, CATHETERIZED  Final   Special Requests NONE  Final   Culture   Final    >=100,000 COLONIES/mL ESCHERICHIA COLI Performed at [facility]    Report Status PENDING  Incomplete     Studies: Dg Chest 2 View  09/07/2015  CLINICAL DATA:  Labored breathing.  Hypoxia. EXAM: CHEST  2 VIEW COMPARISON:  September 05, 2015. FINDINGS: Stable cardiomediastinal silhouette. No pneumothorax or pleural effusion is noted. Right lung is clear. Linear opacity is seen in lingular region which is increased compared to prior exam,  consistent with worsening subsegmental atelectasis or possibly pneumonia. Bony thorax is unremarkable. IMPRESSION: Mildly increased lingular subsegmental atelectasis or possibly pneumonia. Electronically Signed   By: Marijo Conception, M.D.   On: 09/07/2015 14:46   Ct Chest Wo Contrast  09/08/2015  CLINICAL DATA:  Acute onset of back pain and confusion. Hypoxia. Question of lingular pneumonia on radiograph. Recent fall. Initial encounter. EXAM: CT CHEST WITHOUT CONTRAST TECHNIQUE: Multidetector CT imaging of the chest was performed following the standard protocol without IV contrast. COMPARISON:  Chest radiograph performed earlier today at 2:36 p.m. FINDINGS: Patchy airspace opacities within the left lung may reflect atelectasis or possibly mild pneumonia. Mild right basilar atelectasis is noted. No definite pleural effusion or pneumothorax is seen. No dominant masses are identified. Scattered coronary artery calcifications are seen. Mild calcification is noted along the thoracic aorta and proximal great vessels. No pericardial effusion is identified. Visualized mediastinal nodes are borderline normal in size. The visualized portions of the thyroid gland are grossly unremarkable. No axillary lymphadenopathy is seen. The visualized portions of the liver and spleen are  grossly unremarkable. The visualized portions of the pancreas, adrenal glands and gallbladder are within normal limits. A tiny hiatal hernia is suspected. A compression fracture is noted at T12, either acute or subacute in nature, better characterized on concurrent CT of the lumbar spine. Mild anterior bridging osteophytes are noted along the thoracic spine. IMPRESSION: 1. Patchy airspace opacities within the left lung may reflect atelectasis or possibly mild pneumonia. Mild right basilar atelectasis noted. 2. Scattered coronary artery calcifications seen. 3. Tiny hiatal hernia suspected. 4. Compression fracture at T12, either acute or subacute in  nature, better characterized on concurrent CT of the lumbar spine. Electronically Signed   By: Garald Balding M.D.   On: 09/08/2015 02:02   Ct Lumbar Spine Wo Contrast  09/08/2015  CLINICAL DATA:  81 year old female with back pain EXAM: CT LUMBAR SPINE WITHOUT CONTRAST TECHNIQUE: Multidetector CT imaging of the lumbar spine was performed without intravenous contrast administration. Multiplanar CT image reconstructions were also generated. COMPARISON:  Lumbar radiograph dated 09/05/2015 FINDINGS: There is diffuse osteopenia. There is T12 compression fracture with approximately 40% loss of vertebral body height, likely acute or subacute. No definite retropulsed fragment identified. No definite other acute fracture is identified. There is degenerative changes of the spine. Mild aortoiliac atherosclerotic disease. IMPRESSION: T12 compression fracture, likely acute or subacute. Clinical correlation is recommended. No retropulsed fragment identified. Electronically Signed   By: Anner Crete M.D.   On: 09/08/2015 01:01    Scheduled Meds: . amLODipine  5 mg Oral Daily  . atorvastatin  20 mg Oral q1800  . azithromycin  500 mg Intravenous Q24H  . cefTRIAXone (ROCEPHIN)  IV  1 g Intravenous Q24H  . cholecalciferol  4,000 Units Oral Daily  . enoxaparin (LOVENOX) injection  40 mg Subcutaneous Q24H  . insulin aspart  0-15 Units Subcutaneous TID WC  . lisinopril  20 mg Oral Daily  . sodium chloride  3 mL Intravenous Q12H   Continuous Infusions: . sodium chloride 75 mL/hr (09/08/15 1343)       Time spent: 35 minutes    [facility]  [facility] Pager [phone] If 7PM-7AM, please contact night-coverage at [email or web address], password [facility] 09/08/2015, 4:39 PM  LOS: 1 day

## 2015-09-09 LAB — CBC
HCT: 41.8 % (ref 36.0–46.0)
Hemoglobin: 14 g/dL (ref 12.0–15.0)
MCH: 30.1 pg (ref 26.0–34.0)
MCHC: 33.5 g/dL (ref 30.0–36.0)
MCV: 89.9 fL (ref 78.0–100.0)
PLATELETS: 225 10*3/uL (ref 150–400)
RBC: 4.65 MIL/uL (ref 3.87–5.11)
RDW: 13 % (ref 11.5–15.5)
WBC: 10.5 10*3/uL (ref 4.0–10.5)

## 2015-09-09 LAB — GLUCOSE, CAPILLARY
GLUCOSE-CAPILLARY: 179 mg/dL — AB (ref 65–99)
GLUCOSE-CAPILLARY: 205 mg/dL — AB (ref 65–99)
Glucose-Capillary: 219 mg/dL — ABNORMAL HIGH (ref 65–99)
Glucose-Capillary: 102 mg/dL — ABNORMAL HIGH (ref 65–99)

## 2015-09-09 LAB — URINE CULTURE: Culture: >=100000

## 2015-09-09 LAB — BASIC METABOLIC PANEL
Anion gap: 9 (ref 5–15)
BUN: 15 mg/dL (ref 6–20)
CALCIUM: 9.3 mg/dL (ref 8.9–10.3)
CO2: 23 mmol/L (ref 22–32)
Chloride: 104 mmol/L (ref 101–111)
Creatinine, Ser: 0.69 mg/dL (ref 0.44–1.00)
GFR calc Af Amer: >60 mL/min (ref >60)
GLUCOSE: 189 mg/dL — AB (ref 65–99)
Potassium: 3.7 mmol/L (ref 3.5–5.1)
SODIUM: 136 mmol/L (ref 135–145)

## 2015-09-09 MED ORDER — POLYETHYLENE GLYCOL 3350 17 G PO PACK
17.0000 g | PACK | Freq: Every day | ORAL | Status: DC
  Administered 2015-09-09 – 2015-09-10 (×2): 17 g via ORAL
  Filled 2015-09-09 (×3): qty 1

## 2015-09-09 MED ORDER — SODIUM CHLORIDE 0.9 % IV SOLN
INTRAVENOUS | Status: DC
  Administered 2015-09-09 (×2): via INTRAVENOUS

## 2015-09-09 NOTE — Progress Notes (Signed)
[facility] PROGRESS NOTE   Rachel Rangel YYT:[id number] DOB: 22-May-1934 DOA: 09/07/2015 PCP: PROVIDER NOT IN SYSTEM  HPI/Subjective: Still has some back pain but definitely better than admission. Continue to provide pain control with narcotics, PT/OT to evaluate and treat. Likely she is going to need a nursing home.  Assessment/Plan: Principal Problem:   Acute respiratory failure with hypoxia (HCC) Active Problems:   UTI (lower urinary tract infection)   Community acquired pneumonia   Back pain   Fall   Diabetes mellitus type 2, controlled, without complications ([place])   Leukocytosis   Sepsis ([place])   T12 compression fracture ([place])   Acute respiratory failure with hypoxia Came in with oxygen saturation of 86% and labored breathing. This is likely secondary to sepsis syndrome and community acquired pneumonia. Patient started on supplemental oxygen, treated pneumonia and sepsis syndrome.  Sepsis Met sepsis criteria with respiratory rate of 28, WBC of 14.4 and presence of infection (pneumonia/UTI). Patient is hemodynamically stable. Continue IV antibiotics, patient is on Rocephin and azithromycin.  Community acquired pneumonia Has hypoxia, shortness of breath and labored breathing, started on Rocephin and azithromycin. Continue supportive management with bronchodilators, mucolytics, antitussives and oxygen as needed.  Escherichia coli UTI Urinalysis consistent with UTI, patient is started Rocephin. Pansensitive Escherichia coli, she can probably discharge on levofloxacin or Augmentin because of her concurrent pneumonia.  T12 compression fracture Severe back pain, this is happened after the fall. CT scan showed subacute to acute T12 compression fracture with 40% loss of height. Discussed with patient and daughter about conservative management versus kyphoplasty/vertebroplasty. Patient and her daughter chose conservative management with early ambulation and  treatment of the pain.  Diabetes mellitus type 2 Carbohydrate modified diet, hemoglobin A1c is 7.6 correlate with many plasma glucose of 171. Started on SSI and hold oral hypoglycemics.  Leukocytosis Secondary to CAP/UTI and sepsis syndrome.  Code Status: Full Code Family Communication: Plan discussed with the patient. Disposition Plan: Await PT/OT/CSW evaluation for SNF placement. Diet: Diet Carb Modified Fluid consistency:: Thin; Room service appropriate?: Yes  Consultants:  None  Procedures:  None  Antibiotics:  None   Objective: Filed Vitals:   09/09/15 0500 09/09/15 1038  BP: 162/77 153/67  Pulse:    Temp:    Resp:      Intake/Output Summary (Last 24 hours) at 09/09/15 1110 Last data filed at 09/09/15 1040  Gross per 24 hour  Intake 1311.75 ml  Output      0 ml  Net 1311.75 ml   Filed Weights   09/07/15 1336 09/07/15 1647  Weight: 81.647 kg (180 lb) 81.647 kg (180 lb)    Exam: General: Alert and awake, oriented x3, not in any acute distress. HEENT: anicteric sclera, pupils reactive to light and accommodation, EOMI CVS: S1-S2 clear, no murmur rubs or gallops Chest: clear to auscultation bilaterally, no wheezing, rales or rhonchi Abdomen: soft nontender, nondistended, normal bowel sounds, no organomegaly Extremities: no cyanosis, clubbing or edema noted bilaterally Neuro: Cranial nerves II-XII intact, no focal neurological deficits  Data Reviewed: Basic Metabolic Panel:  Recent Labs Lab 09/05/15 2342 09/07/15 1532 09/08/15 0450 09/09/15 0437  NA 136 138 136 136  K 3.9 4.0 4.2 3.7  CL 105 105 102 104  CO2 21* 23 27 23   GLUCOSE 216* 235* 204* 189*  BUN 12 15 16 15   CREATININE 0.77 0.79 0.85 0.69  CALCIUM 9.6 9.7 9.5 9.3   Liver Function Tests:  Recent Labs Lab 09/07/15 1532  AST 18  ALT  20  ALKPHOS 88  BILITOT 2.0*  PROT 7.1  ALBUMIN 3.5   No results for input(s): LIPASE, AMYLASE in the last 168 hours. No results for input(s):  AMMONIA in the last 168 hours. CBC:  Recent Labs Lab 09/05/15 2342 09/07/15 1414 09/08/15 0450 09/09/15 0437  WBC 16.0* 14.4* 11.5* 10.5  NEUTROABS 14.5* 12.2*  --   --   HGB 16.2* 15.5* 14.6 14.0  HCT 46.8* 44.9 41.6 41.8  MCV 89.8 90.2 91.2 89.9  PLT 222 211 191 225   Cardiac Enzymes: No results for input(s): CKTOTAL, CKMB, CKMBINDEX, TROPONINI in the last 168 hours. BNP (last 3 results) No results for input(s): BNP in the last 8760 hours.  ProBNP (last 3 results) No results for input(s): PROBNP in the last 8760 hours.  CBG:  Recent Labs Lab 09/08/15 0726 09/08/15 1151 09/08/15 1603 09/08/15 2158 09/09/15 0729  GLUCAP 191* 172* 211* 211* 179*    Micro Recent Results (from the past 240 hour(s))  Blood culture (routine x 2)     Status: None (Preliminary result)   Collection Time: 09/07/15  2:10 PM  Result Value Ref Range Status   Specimen Description BLOOD LEFT HAND  Final   Special Requests BOTTLES DRAWN AEROBIC AND ANAEROBIC 5ML  Final   Culture   Final    NO GROWTH < 24 HOURS Performed at [facility]    Report Status PENDING  Incomplete  Blood culture (routine x 2)     Status: None (Preliminary result)   Collection Time: 09/07/15  3:30 PM  Result Value Ref Range Status   Specimen Description BLOOD RIGHT HAND  Final   Special Requests BOTTLES DRAWN AEROBIC AND ANAEROBIC 5CC  Final   Culture   Final    NO GROWTH < 24 HOURS Performed at [facility]    Report Status PENDING  Incomplete  Urine culture     Status: None   Collection Time: 09/07/15  4:07 PM  Result Value Ref Range Status   Specimen Description URINE, CATHETERIZED  Final   Special Requests NONE  Final   Culture   Final    >=100,000 COLONIES/mL ESCHERICHIA COLI Performed at [facility]    Report Status 09/09/2015 FINAL  Final   Organism ID, Bacteria ESCHERICHIA COLI  Final      Susceptibility   Escherichia coli - MIC*    AMPICILLIN <=2 SENSITIVE Sensitive      CEFAZOLIN <=4 SENSITIVE Sensitive     CEFTRIAXONE <=1 SENSITIVE Sensitive     CIPROFLOXACIN <=0.25 SENSITIVE Sensitive     GENTAMICIN <=1 SENSITIVE Sensitive     IMIPENEM <=0.25 SENSITIVE Sensitive     NITROFURANTOIN <=16 SENSITIVE Sensitive     TRIMETH/SULFA <=20 SENSITIVE Sensitive     AMPICILLIN/SULBACTAM <=2 SENSITIVE Sensitive     PIP/TAZO <=4 SENSITIVE Sensitive     * >=100,000 COLONIES/mL ESCHERICHIA COLI     Studies: Dg Chest 2 View  09/07/2015  CLINICAL DATA:  Labored breathing.  Hypoxia. EXAM: CHEST  2 VIEW COMPARISON:  September 05, 2015. FINDINGS: Stable cardiomediastinal silhouette. No pneumothorax or pleural effusion is noted. Right lung is clear. Linear opacity is seen in lingular region which is increased compared to prior exam, consistent with worsening subsegmental atelectasis or possibly pneumonia. Bony thorax is unremarkable. IMPRESSION: Mildly increased lingular subsegmental atelectasis or possibly pneumonia. Electronically Signed   By: Marijo Conception, M.D.   On: 09/07/2015 14:46   Ct Chest Wo Contrast  09/08/2015  CLINICAL DATA:  Acute onset of back pain and confusion. Hypoxia. Question of lingular pneumonia on radiograph. Recent fall. Initial encounter. EXAM: CT CHEST WITHOUT CONTRAST TECHNIQUE: Multidetector CT imaging of the chest was performed following the standard protocol without IV contrast. COMPARISON:  Chest radiograph performed earlier today at 2:36 p.m. FINDINGS: Patchy airspace opacities within the left lung may reflect atelectasis or possibly mild pneumonia. Mild right basilar atelectasis is noted. No definite pleural effusion or pneumothorax is seen. No dominant masses are identified. Scattered coronary artery calcifications are seen. Mild calcification is noted along the thoracic aorta and proximal great vessels. No pericardial effusion is identified. Visualized mediastinal nodes are borderline normal in size. The visualized portions of the thyroid gland are  grossly unremarkable. No axillary lymphadenopathy is seen. The visualized portions of the liver and spleen are grossly unremarkable. The visualized portions of the pancreas, adrenal glands and gallbladder are within normal limits. A tiny hiatal hernia is suspected. A compression fracture is noted at T12, either acute or subacute in nature, better characterized on concurrent CT of the lumbar spine. Mild anterior bridging osteophytes are noted along the thoracic spine. IMPRESSION: 1. Patchy airspace opacities within the left lung may reflect atelectasis or possibly mild pneumonia. Mild right basilar atelectasis noted. 2. Scattered coronary artery calcifications seen. 3. Tiny hiatal hernia suspected. 4. Compression fracture at T12, either acute or subacute in nature, better characterized on concurrent CT of the lumbar spine. Electronically Signed   By: Garald Balding M.D.   On: 09/08/2015 02:02   Ct Lumbar Spine Wo Contrast  09/08/2015  CLINICAL DATA:  81 year old female with back pain EXAM: CT LUMBAR SPINE WITHOUT CONTRAST TECHNIQUE: Multidetector CT imaging of the lumbar spine was performed without intravenous contrast administration. Multiplanar CT image reconstructions were also generated. COMPARISON:  Lumbar radiograph dated 09/05/2015 FINDINGS: There is diffuse osteopenia. There is T12 compression fracture with approximately 40% loss of vertebral body height, likely acute or subacute. No definite retropulsed fragment identified. No definite other acute fracture is identified. There is degenerative changes of the spine. Mild aortoiliac atherosclerotic disease. IMPRESSION: T12 compression fracture, likely acute or subacute. Clinical correlation is recommended. No retropulsed fragment identified. Electronically Signed   By: Anner Crete M.D.   On: 09/08/2015 01:01    Scheduled Meds: . amLODipine  5 mg Oral Daily  . atorvastatin  20 mg Oral q1800  . azithromycin  500 mg Intravenous Q24H  . cefTRIAXone  (ROCEPHIN)  IV  1 g Intravenous Q24H  . cholecalciferol  4,000 Units Oral Daily  . enoxaparin (LOVENOX) injection  40 mg Subcutaneous Q24H  . insulin aspart  0-15 Units Subcutaneous TID WC  . lisinopril  20 mg Oral Daily  . polyethylene glycol  17 g Oral Daily  . sodium chloride  3 mL Intravenous Q12H   Continuous Infusions: . sodium chloride 10 mL/hr at 09/09/15 1108       Time spent: 35 minutes    [facility] A  [facility] Pager [phone] If 7PM-7AM, please contact night-coverage at [email or web address], password [facility] 09/09/2015, 11:10 AM  LOS: 2 days

## 2015-09-10 LAB — BASIC METABOLIC PANEL
ANION GAP: 8 (ref 5–15)
BUN: 14 mg/dL (ref 6–20)
CALCIUM: 9 mg/dL (ref 8.9–10.3)
CO2: 23 mmol/L (ref 22–32)
CREATININE: 0.71 mg/dL (ref 0.44–1.00)
Chloride: 102 mmol/L (ref 101–111)
GFR calc Af Amer: >60 mL/min (ref >60)
GLUCOSE: 164 mg/dL — AB (ref 65–99)
Potassium: 3.7 mmol/L (ref 3.5–5.1)
Sodium: 133 mmol/L — ABNORMAL LOW (ref 135–145)

## 2015-09-10 LAB — CBC
HCT: 41.6 % (ref 36.0–46.0)
HEMOGLOBIN: 13.9 g/dL (ref 12.0–15.0)
MCH: 30.1 pg (ref 26.0–34.0)
MCHC: 33.4 g/dL (ref 30.0–36.0)
MCV: 90 fL (ref 78.0–100.0)
PLATELETS: 226 10*3/uL (ref 150–400)
RBC: 4.62 MIL/uL (ref 3.87–5.11)
RDW: 13.1 % (ref 11.5–15.5)
WBC: 9.2 10*3/uL (ref 4.0–10.5)

## 2015-09-10 LAB — GLUCOSE, CAPILLARY
GLUCOSE-CAPILLARY: 156 mg/dL — AB (ref 65–99)
GLUCOSE-CAPILLARY: 207 mg/dL — AB (ref 65–99)
Glucose-Capillary: 177 mg/dL — ABNORMAL HIGH (ref 65–99)
Glucose-Capillary: 148 mg/dL — ABNORMAL HIGH (ref 65–99)

## 2015-09-10 NOTE — Progress Notes (Signed)
Received report from [place], [place]. Agree with previous assessment, patient stable at this time. Will continue to monitor patient.

## 2015-09-10 NOTE — Clinical Social Work Placement (Signed)
   CLINICAL SOCIAL WORK PLACEMENT  NOTE  Date:  09/10/2015  Patient Details  Name: Rachel Rangel MRN: [id number] Date of Birth: [birth year]  Clinical Social Work is seeking post-discharge placement for this patient at the Skilled  Nursing Facility level of care (*CSW will initial, date and re-position this form in  chart as items are completed):  Yes   Patient/family provided with [facility] Clinical Social Work Department's list of facilities offering this level of care within the geographic area requested by the patient (or if unable, by the patient's family).  Yes   Patient/family informed of their freedom to choose among providers that offer the needed level of care, that participate in Medicare, Medicaid or managed care program needed by the patient, have an available bed and are willing to accept the patient.  Yes   Patient/family informed of [facility]'s ownership interest in [facility] and [facility], as well as of the fact that they are under no obligation to receive care at these facilities.  PASRR submitted to EDS on 09/08/15     PASRR number received on 09/08/15     Existing PASRR number confirmed on       FL2 transmitted to all facilities in geographic area requested by pt/family on 09/10/15     FL2 transmitted to all facilities within larger geographic area on       Patient informed that his/her managed care company has contracts with or will negotiate with certain facilities, including the following:            Patient/family informed of bed offers received.  Patient chooses bed at       Physician recommends and patient chooses bed at      Patient to be transferred to   on  .  Patient to be transferred to facility by       Patient family notified on   of transfer.  Name of family member notified:        PHYSICIAN       Additional Comment:    _______________________________________________ Venita Lickampbell, Kingsley Herandez B, LCSW 09/10/2015, 3:33 PM

## 2015-09-10 NOTE — NC FL2 (Signed)
[place] MEDICAID FL2 LEVEL OF CARE SCREENING TOOL     IDENTIFICATION  Patient Name: Rachel ProvidenceMartha J Flye Birthdate: [birth year] Sex: female Admission Date (Current Location): 09/07/2015  [place] [place] [facility] [place]   Facility and Address:  [facility],  [address], [place] [id number]      Provider Number: [id number]  Attending Physician Name and Address:  Clydia LlanoMutaz Elmahi, MD  Relative Name and Phone Number:       Current Level of Care: Hospital Recommended Level of Care: Skilled Nursing Facility Prior Approval Number:    Date Approved/Denied:   PASRR Number: [id number]  Discharge Plan: SNF    Current Diagnoses: Patient Active Problem List   Diagnosis Date Noted  . T12 compression fracture (HCC) 09/08/2015  . UTI (lower urinary tract infection) 09/07/2015  . Community acquired pneumonia 09/07/2015  . Back pain 09/07/2015  . Fall 09/07/2015  . Diabetes mellitus type 2, controlled, without complications (HCC) 09/07/2015  . Leukocytosis 09/07/2015  . Acute respiratory failure with hypoxia (HCC) 09/07/2015  . Sepsis (HCC) 09/07/2015    Orientation RESPIRATION BLADDER Height & Weight    Self, Time, Situation, Place (pt forgetful at times)  O2 (2L) Incontinent 5\' 2"  (157.5 cm) 180 lbs.  BEHAVIORAL SYMPTOMS/MOOD NEUROLOGICAL BOWEL NUTRITION STATUS     (NONE) Continent Diet (Diet Carb Modified)  AMBULATORY STATUS COMMUNICATION OF NEEDS Skin   Extensive Assist Verbally Normal                       Personal Care Assistance Level of Assistance  Bathing, Feeding, Dressing Bathing Assistance: Limited assistance Feeding assistance: Independent Dressing Assistance: Limited assistance     Functional Limitations Info  Sight, Hearing, Speech Sight Info: Adequate Hearing Info: Impaired Speech Info: Adequate    SPECIAL CARE FACTORS FREQUENCY  PT (By licensed PT), OT (By licensed OT)     PT Frequency: 5 x a week OT Frequency: 5 x a  week             Contractures Contractures Info: Not present    Additional Factors Info  Code Status, Allergies, Insulin Sliding Scale Code Status Info: FULL code status Allergies Info: Lipitor, Penicillins   Insulin Sliding Scale Info: Novolog 3 x a day       Current Medications (09/10/2015):  This is the current hospital active medication list Current Facility-Administered Medications  Medication Dose Route Frequency Provider Last Rate Last Dose  . 0.9 %  sodium chloride infusion   Intravenous Continuous Clydia LlanoMutaz Elmahi, MD 10 mL/hr at 09/10/15 1230    . acetaminophen (TYLENOL) tablet 650 mg  650 mg Oral Q6H PRN Clydia LlanoMutaz Elmahi, MD   650 mg at 09/09/15 1655   Or  . acetaminophen (TYLENOL) suppository 650 mg  650 mg Rectal Q6H PRN Clydia LlanoMutaz Elmahi, MD      . amLODipine (NORVASC) tablet 5 mg  5 mg Oral Daily Clydia LlanoMutaz Elmahi, MD   5 mg at 09/10/15 1051  . atorvastatin (LIPITOR) tablet 20 mg  20 mg Oral q1800 Clydia LlanoMutaz Elmahi, MD   20 mg at 09/09/15 1739  . azithromycin (ZITHROMAX) 500 mg in dextrose 5 % 250 mL IVPB  500 mg Intravenous Q24H Clydia LlanoMutaz Elmahi, MD   500 mg at 09/09/15 1559  . cefTRIAXone (ROCEPHIN) 1 g in dextrose 5 % 50 mL IVPB  1 g Intravenous Q24H Clydia LlanoMutaz Elmahi, MD   1 g at 09/10/15 1158  . cholecalciferol (VITAMIN D) tablet 4,000 Units  4,000  Units Oral Daily Clydia Llano, MD   4,000 Units at 09/10/15 1051  . enoxaparin (LOVENOX) injection 40 mg  40 mg Subcutaneous Q24H Clydia Llano, MD   40 mg at 09/09/15 2122  . hydrALAZINE (APRESOLINE) injection 10 mg  10 mg Intravenous Q6H PRN Jinger Neighbors, NP   10 mg at 09/10/15 1211  . HYDROcodone-acetaminophen (NORCO/VICODIN) 5-325 MG per tablet 1-2 tablet  1-2 tablet Oral Q4H PRN Clydia Llano, MD   2 tablet at 09/10/15 0754  . insulin aspart (novoLOG) injection 0-15 Units  0-15 Units Subcutaneous TID WC Clydia Llano, MD   5 Units at 09/10/15 1157  . lisinopril (PRINIVIL,ZESTRIL) tablet 20 mg  20 mg Oral Daily Clydia Llano, MD   20 mg at 09/10/15  1051  . morphine 2 MG/ML injection 1 mg  1 mg Intravenous Q4H PRN Clydia Llano, MD      . ondansetron (ZOFRAN) tablet 4 mg  4 mg Oral Q6H PRN Clydia Llano, MD       Or  . ondansetron (ZOFRAN) injection 4 mg  4 mg Intravenous Q6H PRN Mutaz Elmahi, MD      . polyethylene glycol (MIRALAX / GLYCOLAX) packet 17 g  17 g Oral Daily Clydia Llano, MD   17 g at 09/10/15 1051  . sodium chloride (OCEAN) 0.65 % nasal spray 1 spray  1 spray Nasal QHS PRN Clydia Llano, MD      . sodium chloride 0.9 % injection 3 mL  3 mL Intravenous Q12H Clydia Llano, MD   3 mL at 09/10/15 1051     Discharge Medications: Please see discharge summary for a list of discharge medications.  Relevant Imaging Results:  Relevant Lab Results:   Additional Information SSN: [id number]  Venita Lick, LCSW

## 2015-09-10 NOTE — Clinical Social Work Note (Signed)
Clinical Social Work Assessment  Patient Details  Name: Rachel Rangel MRN: [id number] Date of Birth: [birth year]  Date of referral:  09/03/15               Reason for consult:  Facility Placement                Permission sought to share information with:  Family Supports, Chartered certified accountant granted to share information::  Yes, Verbal Permission Granted  Name::     Optician, dispensing::  SNFs  Relationship::     Contact Information:     Housing/Transportation Living arrangements for the past 2 months:  [place] of Information:  Patient, Adult Children Patient Interpreter Needed:  None Criminal Activity/Legal Involvement Pertinent to Current Situation/Hospitalization:  No - Comment as needed Significant Relationships:  Adult Children Lives with:  Self Do you feel safe going back to the place where you live?  Yes Need for family participation in patient care:  Yes (Comment)  Care giving concerns:  Patient and patient's daughter agree that the patient cannot return home at this time. Both feel patient will benefit from short term rehab stay.   Social Worker assessment / plan:  CSW met with patient at bedside to complete assessment. Patient's daughter Rachel Rangel on speakerphone as well per daughter's request. Patient agreeable to daughter being on phone during assessment. Patient and daughter both agree to SNF placement at discharge. CSW explained SNF search/placement process and encouraged the patient/daughter to contact the insurance company directly to discuss any questions they may have about SNF coverage/copays. CSW has emailed the daughter a list of SNFs that referrals will be made to. Per patient and daughter, the patient lives alone at this time and plans to return home alone when finished with rehab. CSW explained that the patient will have to go to a facility that accepts [facility] and possibly an LOG if the facility cannot obtain authorization  in a timely manner. CSW will follow-up with bed offers once available.   Employment status:  Retired Nurse, adult PT Recommendations:  [place] / Referral to community resources:  [place]  Patient/Family's Response to care:  Patient and patient's family appear to be happy with the care the patient has received.   Patient/Family's Understanding of and Emotional Response to Diagnosis, Current Treatment, and Prognosis:  Patient and daughter both understand reason for admission, diagnosis, and post DC needs.   Emotional Assessment Appearance:  Appears stated age Attitude/Demeanor/Rapport:  Other (Appropriate and welcoming of CSW) Affect (typically observed):  Accepting, Calm, Appropriate Orientation:  Oriented to Self, Oriented to Place, Oriented to  Time, Oriented to Situation Alcohol / Substance use:  Tobacco Use, Alcohol Use Psych involvement (Current and /or in the community):  No (Comment)  Discharge Needs  Concerns to be addressed:  Discharge Planning Concerns Readmission within the last 30 days:  No Current discharge risk:  Physical Impairment, Chronically ill, Lives alone Barriers to Discharge:  Continued Medical Work up  [employer] MSW, [place], [place], [phone]

## 2015-09-10 NOTE — Progress Notes (Signed)
[facility] PROGRESS NOTE   Rachel Rangel YHT:[id number] DOB: 1934/01/28 DOA: 09/07/2015 PCP: PROVIDER NOT IN SYSTEM  HPI/Subjective: Complains about back pain every now and then that controlled. PT/OT and CSW evaluation pending.  Assessment/Plan: Principal Problem:   Acute respiratory failure with hypoxia (HCC) Active Problems:   UTI (lower urinary tract infection)   Community acquired pneumonia   Back pain   Fall   Diabetes mellitus type 2, controlled, without complications ([place])   Leukocytosis   Sepsis ([place])   T12 compression fracture ([place])   Acute respiratory failure with hypoxia Came in with oxygen saturation of 86% and labored breathing. This is likely secondary to sepsis syndrome and community acquired pneumonia. Patient started on supplemental oxygen, treated pneumonia and sepsis syndrome.  Sepsis Met sepsis criteria with respiratory rate of 28, WBC of 14.4 and presence of infection (pneumonia/UTI). Patient is hemodynamically stable. Continue IV antibiotics, patient is on Rocephin and azithromycin.  Community acquired pneumonia Has hypoxia, shortness of breath and labored breathing, started on Rocephin and azithromycin. Continue supportive management with bronchodilators, mucolytics, antitussives and oxygen as needed. No changes in her respiratory regimen.  Escherichia coli UTI Urinalysis consistent with UTI, patient is started Rocephin. Pansensitive Escherichia coli, she can probably discharge on levofloxacin or Augmentin because of her concurrent pneumonia.  T12 compression fracture Severe back pain, this is happened after the fall. CT scan showed subacute to acute T12 compression fracture with 40% loss of height. Discussed with patient and daughter about conservative management versus kyphoplasty/vertebroplasty. Patient and her daughter chose conservative management with early ambulation and treatment of the pain. Continue pain  management.  Diabetes mellitus type 2 Carbohydrate modified diet, hemoglobin A1c is 7.6 correlate with many plasma glucose of 171. Started on SSI and hold oral hypoglycemics.  Leukocytosis Secondary to CAP/UTI and sepsis syndrome.  Code Status: Full Code Family Communication: Plan discussed with the patient. Disposition Plan: Await PT/OT/CSW evaluation for SNF placement. Likely in a.m. Diet: Diet Carb Modified Fluid consistency:: Thin; Room service appropriate?: Yes  Consultants:  None  Procedures:  None  Antibiotics:  None   Objective: Filed Vitals:   09/09/15 2136 09/10/15 0649  BP: 166/72 169/88  Pulse: 63 72  Temp: 98.6 F (37 C) 98.6 F (37 C)  Resp: 20 20    Intake/Output Summary (Last 24 hours) at 09/10/15 1038 Last data filed at 09/09/15 1833  Gross per 24 hour  Intake 787.33 ml  Output      0 ml  Net 787.33 ml   Filed Weights   09/07/15 1336 09/07/15 1647  Weight: 81.647 kg (180 lb) 81.647 kg (180 lb)    Exam: General: Alert and awake, oriented x3, not in any acute distress. HEENT: anicteric sclera, pupils reactive to light and accommodation, EOMI CVS: S1-S2 clear, no murmur rubs or gallops Chest: clear to auscultation bilaterally, no wheezing, rales or rhonchi Abdomen: soft nontender, nondistended, normal bowel sounds, no organomegaly Extremities: no cyanosis, clubbing or edema noted bilaterally Neuro: Cranial nerves II-XII intact, no focal neurological deficits  Data Reviewed: Basic Metabolic Panel:  Recent Labs Lab 09/05/15 2342 09/07/15 1532 09/08/15 0450 09/09/15 0437 09/10/15 0445  NA 136 138 136 136 133*  K 3.9 4.0 4.2 3.7 3.7  CL 105 105 102 104 102  CO2 21* 23 27 23 23   GLUCOSE 216* 235* 204* 189* 164*  BUN 12 15 16 15 14   CREATININE 0.77 0.79 0.85 0.69 0.71  CALCIUM 9.6 9.7 9.5 9.3 9.0   Liver Function  Tests:  Recent Labs Lab 09/07/15 1532  AST 18  ALT 20  ALKPHOS 88  BILITOT 2.0*  PROT 7.1  ALBUMIN 3.5   No  results for input(s): LIPASE, AMYLASE in the last 168 hours. No results for input(s): AMMONIA in the last 168 hours. CBC:  Recent Labs Lab 09/05/15 2342 09/07/15 1414 09/08/15 0450 09/09/15 0437 09/10/15 0445  WBC 16.0* 14.4* 11.5* 10.5 9.2  NEUTROABS 14.5* 12.2*  --   --   --   HGB 16.2* 15.5* 14.6 14.0 13.9  HCT 46.8* 44.9 41.6 41.8 41.6  MCV 89.8 90.2 91.2 89.9 90.0  PLT 222 211 191 225 226   Cardiac Enzymes: No results for input(s): CKTOTAL, CKMB, CKMBINDEX, TROPONINI in the last 168 hours. BNP (last 3 results) No results for input(s): BNP in the last 8760 hours.  ProBNP (last 3 results) No results for input(s): PROBNP in the last 8760 hours.  CBG:  Recent Labs Lab 09/09/15 0729 09/09/15 1136 09/09/15 1708 09/09/15 2141 09/10/15 0723  GLUCAP 179* 205* 219* 102* 156*    Micro Recent Results (from the past 240 hour(s))  Blood culture (routine x 2)     Status: None (Preliminary result)   Collection Time: 09/07/15  2:10 PM  Result Value Ref Range Status   Specimen Description BLOOD LEFT HAND  Final   Special Requests BOTTLES DRAWN AEROBIC AND ANAEROBIC 5ML  Final   Culture   Final    NO GROWTH 2 DAYS Performed at [facility]    Report Status PENDING  Incomplete  Blood culture (routine x 2)     Status: None (Preliminary result)   Collection Time: 09/07/15  3:30 PM  Result Value Ref Range Status   Specimen Description BLOOD RIGHT HAND  Final   Special Requests BOTTLES DRAWN AEROBIC AND ANAEROBIC 5CC  Final   Culture   Final    NO GROWTH 2 DAYS Performed at [facility]    Report Status PENDING  Incomplete  Urine culture     Status: None   Collection Time: 09/07/15  4:07 PM  Result Value Ref Range Status   Specimen Description URINE, CATHETERIZED  Final   Special Requests NONE  Final   Culture   Final    >=100,000 COLONIES/mL ESCHERICHIA COLI Performed at [facility]    Report Status 09/09/2015 FINAL  Final   Organism ID,  Bacteria ESCHERICHIA COLI  Final      Susceptibility   Escherichia coli - MIC*    AMPICILLIN <=2 SENSITIVE Sensitive     CEFAZOLIN <=4 SENSITIVE Sensitive     CEFTRIAXONE <=1 SENSITIVE Sensitive     CIPROFLOXACIN <=0.25 SENSITIVE Sensitive     GENTAMICIN <=1 SENSITIVE Sensitive     IMIPENEM <=0.25 SENSITIVE Sensitive     NITROFURANTOIN <=16 SENSITIVE Sensitive     TRIMETH/SULFA <=20 SENSITIVE Sensitive     AMPICILLIN/SULBACTAM <=2 SENSITIVE Sensitive     PIP/TAZO <=4 SENSITIVE Sensitive     * >=100,000 COLONIES/mL ESCHERICHIA COLI     Studies: No results found.  Scheduled Meds: . amLODipine  5 mg Oral Daily  . atorvastatin  20 mg Oral q1800  . azithromycin  500 mg Intravenous Q24H  . cefTRIAXone (ROCEPHIN)  IV  1 g Intravenous Q24H  . cholecalciferol  4,000 Units Oral Daily  . enoxaparin (LOVENOX) injection  40 mg Subcutaneous Q24H  . insulin aspart  0-15 Units Subcutaneous TID WC  . lisinopril  20 mg Oral Daily  .  polyethylene glycol  17 g Oral Daily  . sodium chloride  3 mL Intravenous Q12H   Continuous Infusions: . sodium chloride 10 mL/hr at 09/09/15 1756       Time spent: 35 minutes    [facility] A  [facility] Pager [phone] If 7PM-7AM, please contact night-coverage at [email or web address], password [facility] 09/10/2015, 10:38 AM  LOS: 3 days

## 2015-09-10 NOTE — Evaluation (Signed)
Physical Therapy Evaluation Patient Details Name: Rachel Rangel MRN: [id number] DOB: 1933-10-15 Today's Date: 09/10/2015   History of Present Illness  81 y.o. female with h/o DM, HTN admitted with PNA, UTI, sepsis, T12 compression fx, fall on 09/05/15.   Clinical Impression  Pt admitted with above diagnosis. Pt currently with functional limitations due to the deficits listed below (see PT Problem List). MOd assist to roll in bed, attempted sidelying to sit by raising HOB, pt unable to tolerate coming to full upright seated position 2* back pain. Performed BLE ROM exercises. SNF recommended. Pt will benefit from skilled PT to increase their independence and safety with mobility to allow discharge to the venue listed below.       Follow Up Recommendations SNF;Supervision/Assistance - 24 hour    Equipment Recommendations  Wheelchair (measurements PT);Rolling walker with 5" wheels    Recommendations for Other Services OT consult     Precautions / Restrictions Precautions Precautions: Fall Precaution Comments: fall 09/05/15, one other fall 2 years ago per daughter Restrictions Weight Bearing Restrictions: No      Mobility  Bed Mobility Overal bed mobility: +2 for physical assistance;Needs Assistance Bed Mobility: Rolling;Sidelying to Sit Rolling: Mod assist Sidelying to sit: Total assist;HOB elevated       General bed mobility comments: instructed pt in log roll technique, mod A to initiate movement, gradually raised HOB with pt in sidelying, pt reported pain with movement, only able to elevate HOB 45*, not able to come upright to sitting due to 7/10 pain, increased time for all movement (daughter stated that since pt's fall 2 yrs ago all her movement is slow and calculated)  Transfers                    Ambulation/Gait                Stairs            Wheelchair Mobility    Modified Rankin (Stroke Patients Only)       Balance                                              Pertinent Vitals/Pain Pain Assessment: 0-10 Pain Score: 7  Pain Location: back with movement Pain Descriptors / Indicators: Sharp Pain Intervention(s): Premedicated before session;Monitored during session;Repositioned;Limited activity within patient's tolerance    Home Living Family/patient expects to be discharged to:: Private residence Living Arrangements: Alone Available Help at Discharge: Available PRN/intermittently;Family Type of Home: House Home Access: Stairs to enter Entrance Stairs-Rails: Doctor, general practiceight;Left Entrance Stairs-Number of Steps: 4 Home Layout: One level Home Equipment: Cane - quad;Grab bars - tub/shower      Prior Function Level of Independence: Independent with assistive device(s)         Comments: pt drives, independent with ADLs     Hand Dominance        Extremity/Trunk Assessment   Upper Extremity Assessment: Generalized weakness           Lower Extremity Assessment: Generalized weakness (limited assessment due to back pain with movement, ankle AROM WNL, pt able to participate with hip ABD and heel slides AAROM in bed)         Communication   Communication: HOH  Cognition Arousal/Alertness: Awake/alert Behavior During Therapy: WFL for tasks assessed/performed Overall Cognitive Status: Within Functional Limits for tasks assessed  General Comments      Exercises General Exercises - Lower Extremity Ankle Circles/Pumps: AROM;Both;10 reps;Supine Heel Slides: AAROM;Both;10 reps;Supine Hip ABduction/ADduction: AAROM;Both;10 reps;Supine      Assessment/Plan    PT Assessment Patient needs continued PT services  PT Diagnosis Difficulty walking;Generalized weakness;Acute pain   PT Problem List Decreased strength;Decreased activity tolerance;Decreased mobility;Pain;Decreased knowledge of use of DME  PT Treatment Interventions DME instruction;Therapeutic  exercise;Functional mobility training;Therapeutic activities;Patient/family education;Gait training   PT Goals (Current goals can be found in the Care Plan section) Acute Rehab PT Goals Patient Stated Goal: to decrease pain, to be able to walk PT Goal Formulation: With patient/family Time For Goal Achievement: 09/24/15 Potential to Achieve Goals: Fair    Frequency Min 3X/week   Barriers to discharge        Co-evaluation               End of Session   Activity Tolerance: Patient limited by pain Patient left: in bed;with family/visitor present;with call bell/phone within reach Nurse Communication: Mobility status;Need for lift equipment         Time: 9604-5409 PT Time Calculation (min) (ACUTE ONLY): 34 min   Charges:   PT Evaluation $Initial PT Evaluation Tier I: 1 Procedure PT Treatments $Therapeutic Activity: 8-22 mins   PT G Codes:        Tamala Ser 09/10/2015, 11:37 AM [phone]

## 2015-09-11 DIAGNOSIS — Z79899 Other long term (current) drug therapy: Secondary | ICD-10-CM | POA: Diagnosis not present

## 2015-09-11 DIAGNOSIS — M549 Dorsalgia, unspecified: Secondary | ICD-10-CM | POA: Diagnosis not present

## 2015-09-11 DIAGNOSIS — E119 Type 2 diabetes mellitus without complications: Secondary | ICD-10-CM | POA: Diagnosis not present

## 2015-09-11 DIAGNOSIS — B373 Candidiasis of vulva and vagina: Secondary | ICD-10-CM | POA: Diagnosis not present

## 2015-09-11 DIAGNOSIS — J9611 Chronic respiratory failure with hypoxia: Secondary | ICD-10-CM | POA: Diagnosis not present

## 2015-09-11 DIAGNOSIS — J9601 Acute respiratory failure with hypoxia: Secondary | ICD-10-CM | POA: Diagnosis not present

## 2015-09-11 DIAGNOSIS — R278 Other lack of coordination: Secondary | ICD-10-CM | POA: Diagnosis not present

## 2015-09-11 DIAGNOSIS — J189 Pneumonia, unspecified organism: Secondary | ICD-10-CM | POA: Diagnosis not present

## 2015-09-11 DIAGNOSIS — M6281 Muscle weakness (generalized): Secondary | ICD-10-CM | POA: Diagnosis not present

## 2015-09-11 DIAGNOSIS — R6520 Severe sepsis without septic shock: Secondary | ICD-10-CM | POA: Diagnosis not present

## 2015-09-11 DIAGNOSIS — R531 Weakness: Secondary | ICD-10-CM | POA: Diagnosis not present

## 2015-09-11 DIAGNOSIS — B9620 Unspecified Escherichia coli [E. coli] as the cause of diseases classified elsewhere: Secondary | ICD-10-CM | POA: Diagnosis not present

## 2015-09-11 DIAGNOSIS — Z9181 History of falling: Secondary | ICD-10-CM | POA: Diagnosis not present

## 2015-09-11 DIAGNOSIS — N390 Urinary tract infection, site not specified: Secondary | ICD-10-CM | POA: Diagnosis not present

## 2015-09-11 DIAGNOSIS — S22088D Other fracture of T11-T12 vertebra, subsequent encounter for fracture with routine healing: Secondary | ICD-10-CM | POA: Diagnosis not present

## 2015-09-11 DIAGNOSIS — I10 Essential (primary) hypertension: Secondary | ICD-10-CM | POA: Diagnosis not present

## 2015-09-11 DIAGNOSIS — E785 Hyperlipidemia, unspecified: Secondary | ICD-10-CM | POA: Diagnosis not present

## 2015-09-11 DIAGNOSIS — M546 Pain in thoracic spine: Secondary | ICD-10-CM | POA: Diagnosis not present

## 2015-09-11 DIAGNOSIS — M4854XS Collapsed vertebra, not elsewhere classified, thoracic region, sequela of fracture: Secondary | ICD-10-CM | POA: Diagnosis not present

## 2015-09-11 DIAGNOSIS — R2681 Unsteadiness on feet: Secondary | ICD-10-CM | POA: Diagnosis not present

## 2015-09-11 LAB — BASIC METABOLIC PANEL
ANION GAP: 7 (ref 5–15)
BUN: 15 mg/dL (ref 6–20)
CHLORIDE: 99 mmol/L — AB (ref 101–111)
CO2: 25 mmol/L (ref 22–32)
Calcium: 8.8 mg/dL — ABNORMAL LOW (ref 8.9–10.3)
Creatinine, Ser: 0.72 mg/dL (ref 0.44–1.00)
GFR calc Af Amer: >60 mL/min (ref >60)
GLUCOSE: 166 mg/dL — AB (ref 65–99)
POTASSIUM: 3.5 mmol/L (ref 3.5–5.1)
Sodium: 131 mmol/L — ABNORMAL LOW (ref 135–145)

## 2015-09-11 LAB — CBC
HEMATOCRIT: 39.3 % (ref 36.0–46.0)
HEMOGLOBIN: 13 g/dL (ref 12.0–15.0)
MCH: 30 pg (ref 26.0–34.0)
MCHC: 33.1 g/dL (ref 30.0–36.0)
MCV: 90.6 fL (ref 78.0–100.0)
Platelets: 212 10*3/uL (ref 150–400)
RBC: 4.34 MIL/uL (ref 3.87–5.11)
RDW: 13.1 % (ref 11.5–15.5)
WBC: 9.2 10*3/uL (ref 4.0–10.5)

## 2015-09-11 LAB — GLUCOSE, CAPILLARY
GLUCOSE-CAPILLARY: 237 mg/dL — AB (ref 65–99)
Glucose-Capillary: 174 mg/dL — ABNORMAL HIGH (ref 65–99)
Glucose-Capillary: 218 mg/dL — ABNORMAL HIGH (ref 65–99)

## 2015-09-11 MED ORDER — LEVOFLOXACIN 750 MG PO TABS: 750.0000 mg | ORAL_TABLET | Freq: Every day | ORAL | Status: DC

## 2015-09-11 MED ORDER — HYDROCODONE-ACETAMINOPHEN 5-325 MG PO TABS: 1.0000 | ORAL_TABLET | ORAL | Status: DC | PRN

## 2015-09-11 NOTE — Evaluation (Signed)
Occupational Therapy Evaluation Patient Details Name: Rachel Rangel MRN: [id number] DOB: August 01, 1934 Today's Date: 09/11/2015    History of Present Illness 81 y.o. female with h/o DM, HTN admitted with PNA, UTI, sepsis, T12 compression fx, fall on 09/05/15.    Clinical Impression   Pt admitted with fall with compression fracture. Pt currently with functional limitations due to the deficits listed below (see OT Problem List).  Pt will benefit from skilled OT to increase their safety and independence with ADL and functional mobility for ADL to facilitate discharge to venue listed below.      Follow Up Recommendations  SNF    Equipment Recommendations  None recommended by OT       Precautions / Restrictions Precautions Precautions: Fall Precaution Comments: fall 09/05/15, one other fall 2 years ago per daughter Restrictions Weight Bearing Restrictions: No      Mobility Bed Mobility Overal bed mobility: +2 for physical assistance;Needs Assistance Bed Mobility: Rolling;Sidelying to Sit Rolling: Max assist Sidelying to sit: Max assist          Transfers                 General transfer comment: did not perform    Balance Overall balance assessment: Needs assistance Sitting-balance support: Bilateral upper extremity supported Sitting balance-Leahy Scale: Fair   Postural control: Posterior lean                                  ADL Overall ADL's : Needs assistance/impaired Eating/Feeding: Set up;Bed level   Grooming: Bed level;Set up   Upper Body Bathing: Moderate assistance;Sitting   Lower Body Bathing: Maximal assistance;Sitting/lateral leans   Upper Body Dressing : Minimal assistance;Sitting   Lower Body Dressing: Total assistance;Sitting/lateral leans                                 Pertinent Vitals/Pain Pain Score: 6  Pain Location: back Pain Descriptors / Indicators: Sore Pain Intervention(s): Monitored during  session     Hand Dominance     Extremity/Trunk Assessment Upper Extremity Assessment Upper Extremity Assessment: Generalized weakness           Communication Communication Communication: HOH   Cognition Arousal/Alertness: Awake/alert Behavior During Therapy: WFL for tasks assessed/performed Overall Cognitive Status: Within Functional Limits for tasks assessed                                Home Living Family/patient expects to be discharged to:: Skilled nursing facility Living Arrangements: Alone Available Help at Discharge: Available PRN/intermittently;Family Type of Home: House Home Access: Stairs to enter Entergy CorporationEntrance Stairs-Number of Steps: 4 Entrance Stairs-Rails: Right;Left Home Layout: One level     Bathroom Shower/Tub: Producer, television/film/videoWalk-in shower   Bathroom Toilet: Handicapped height     Home Equipment: Cane - quad;Grab bars - tub/shower          Prior Functioning/Environment Level of Independence: Independent with assistive device(s)        Comments: pt drives, independent with ADLs    OT Diagnosis: Generalized weakness;Acute pain   OT Problem List: Decreased activity tolerance;Decreased strength;Pain                       End of Session    Activity Tolerance: Patient limited by fatigue Patient  left: in bed;with call bell/phone within reach;with bed alarm set   Time: 1213-1229 OT Time Calculation (min): 16 min Charges:  OT General Charges $OT Visit: 1 Procedure OT Evaluation $Initial OT Evaluation Tier I: 1 Procedure G-Codes:    Alba Cory September 12, 2015, 12:36 PM

## 2015-09-11 NOTE — Clinical Social Work Placement (Signed)
   CLINICAL SOCIAL WORK PLACEMENT  NOTE  Date:  09/11/2015  Patient Details  Name: Rachel Rangel MRN: [id number] Date of Birth: Dec 21, 1933  Clinical Social Work is seeking post-discharge placement for this patient at the Skilled  Nursing Facility level of care (*CSW will initial, date and re-position this form in  chart as items are completed):  Yes   Patient/family provided with [place] Clinical Social Work Department's list of facilities offering this level of care within the geographic area requested by the patient (or if unable, by the patient's family).  Yes   Patient/family informed of their freedom to choose among providers that offer the needed level of care, that participate in Medicare, Medicaid or managed care program needed by the patient, have an available bed and are willing to accept the patient.  Yes   Patient/family informed of [facility] ownership interest [facility], as well as of the fact that they are under no obligation to receive care at these facilities.  PASRR submitted to EDS on 09/08/15     PASRR number received on 09/08/15     Existing PASRR number confirmed on       FL2 transmitted to all facilities in geographic area requested by pt/family on 09/10/15     FL2 transmitted to all facilities within larger geographic area on       Patient informed that his/her managed care company has contracts with or will negotiate with certain facilities, including the following:        Yes   Patient/family informed of bed offers received.  [facility]     Physician recommends and patient chooses bed at      [facility] 09[facility]/15.  Patient to be transferred to facility by ambulance [facility])     Patient family notified on 09/11/15 of transfer.  Name of family member notified:  pt daughter, Ambrose MantleCathy Anderson notified via telephone     PHYSICIAN Please sign FL2      Additional Comment:    _______________________________________________ Orson EvaKIDD, Abbigale Mcelhaney A, LCSW 09/11/2015, 5:02 PM

## 2015-09-11 NOTE — Progress Notes (Signed)
CSW spoke with pt daughter, Lynden AngCathy this morning regarding SNF placement.   CSW discussed SNF bed offers. [facility]   [facility] and facility confirmed they could accept pt today. [facility] initiated insurance authorization with [facility] before lunch today.  CSW updated pt daughter, Lynden AngCathy regarding plan and pt daughter agreeable to transfer when insurance auth received.  CSW received notification from [facility] at 4:47 pm that facility received insurance authorization with [facility] and pt can admit to facility today.  CSW facilitated pt discharge needs including contacting facility, faxing pt discharge information via [employer], discussing with pt and pt daughter, Lynden AngCathy at bedside, providing RN phone number to call report, and arranging [facility].   No further social work needs identified at this time.  CSW signing off.   Loletta SpecterSuzanna Labrisha Wuellner, MSW, LCSW Clinical Social Work [phone]

## 2015-09-11 NOTE — Clinical Social Work Placement (Signed)
   CLINICAL SOCIAL WORK PLACEMENT  NOTE  Date:  09/11/2015  Patient Details  Name: Rachel ProvidenceMartha J Collington MRN: [id number] Date of Birth: January 18, 1934  Clinical Social Work is seeking post-discharge placement for this patient at the Skilled  Nursing Facility level of care (*CSW will initial, date and re-position this form in  chart as items are completed):  Yes   Patient/family provided with [place] Clinical Social Work Department's list of facilities offering this level of care within the geographic area requested by the patient (or if unable, by the patient's family).  Yes   Patient/family informed of their freedom to choose among providers that offer the needed level of care, that participate in Medicare, Medicaid or managed care program needed by the patient, have an available bed and are willing to accept the patient.  Yes   Patient/family informed of [facility]'s ownership interest in [facility] and [facility], as well as of the fact that they are under no obligation to receive care at these facilities.  PASRR submitted to EDS on 09/08/15     PASRR number received on 09/08/15     Existing PASRR number confirmed on       FL2 transmitted to all facilities in geographic area requested by pt/family on 09/10/15     FL2 transmitted to all facilities within larger geographic area on       Patient informed that his/her managed care company has contracts with or will negotiate with certain facilities, including the following:        Yes   Patient/family informed of bed offers received.  [facility]     Physician recommends and patient chooses bed at      [facility]  .  Patient to be transferred to facility by       Patient family notified on   of transfer.  Name of family member notified:        PHYSICIAN       Additional Comment:    _______________________________________________ Orson EvaKIDD, SUZANNA A,  LCSW 09/11/2015, 10:08 AM

## 2015-09-11 NOTE — Discharge Summary (Signed)
Physician Discharge Summary  Rachel Rangel IZT:[id number] DOB: 10-29-1933 DOA: 09/07/2015  PCP: PROVIDER NOT IN SYSTEM  Admit date: 09/07/2015 Discharge date: 09/11/2015  Time spent: 40 minutes  Recommendations for Outpatient Follow-up:  1. Follow up with the nursing home MD. 2. Levaquin for 5 more days   Discharge Diagnoses:  Principal Problem:   Acute respiratory failure with hypoxia ([place]) Active Problems:   UTI (lower urinary tract infection)   Community acquired pneumonia   Back pain   Fall   Diabetes mellitus type 2, controlled, without complications ([place])   Leukocytosis   Sepsis ([place])   T12 compression fracture ([place])   Discharge Condition: Stable  Diet recommendation: HEART HEALTHY  Filed Weights   09/07/15 1336 09/07/15 1647  Weight: 81.647 kg (180 lb) 81.647 kg (180 lb)    History of present illness:  Rachel Rangel is a 81 y.o. female with past medical history of diabetes mellitus, HTN and HOH came into the hospital with back pain and confusion. Patient sore started on 12/6 when she fell while she was trying to get out of her chair, her daughter brought her to the hospital, patient had x-rays done showed no acute fractures discharged home, her daughter stayed the last 2 nights with her, patient is having a lot of back pain which is restricting her movement. Pain does not radiate, worse when she takes deep breath or move around, and improved when she lays still. Her daughter also notices some confusion with "whistling" sounds. Patient is short of breath, the urine has foul mL. In the ED patient was found hypoxic with oxygen saturation of 86% on room air, required to 3 L of oxygen to keep sats above 91%, x-ray questions lingular pneumonia and her urinalysis consistent with UTI, patient admitted to the hospital for further evaluation.   Hospital Course:   Acute respiratory failure with hypoxia Came in with oxygen saturation of 86% and labored breathing. This is  likely secondary to sepsis syndrome and community acquired pneumonia. Started on broad-spectrum antibiotics for pneumonia.  Sepsis Met sepsis criteria with respiratory rate of 28, WBC of 14.4 and presence of infection (pneumonia/UTI). Patient is hemodynamically stable. Continue IV antibiotics, treated with Rocephin and azithromycin in the hospital. On discharge levofloxacin for 5 more days.  Community acquired pneumonia Has hypoxia, shortness of breath and labored breathing, started on Rocephin and azithromycin. Continue supportive management with bronchodilators, mucolytics, antitussives and oxygen as needed. Levofloxacin for 5 more days on discharge.  Escherichia coli UTI Urinalysis consistent with UTI, patient is started Rocephin. Pansensitive Escherichia coli, on Levaquin.  T12 compression fracture Severe back pain, this is happened after the fall. CT scan showed subacute to acute T12 compression fracture with 40% loss of height. Discussed with patient and daughter about conservative management versus kyphoplasty/vertebroplasty. Patient and her daughter chose conservative management with early ambulation and pain control.  Diabetes mellitus type 2 Carbohydrate modified diet, hemoglobin A1c is 7.6 correlate with many plasma glucose of 171. SSI and oral hypoglycemic were held during hospital stay, restarted on discharge.  Leukocytosis Secondary to CAP/UTI and sepsis syndrome.   Procedures:  None  Consultations:  None  Discharge Exam: Filed Vitals:   09/10/15 2150 09/11/15 0517  BP: 163/52 166/61  Pulse: 67 66  Temp: 97.2 F (36.2 C) 98.5 F (36.9 C)  Resp: 20 20   General: Alert and awake, oriented x3, not in any acute distress. HEENT: anicteric sclera, pupils reactive to light and accommodation, EOMI CVS: S1-S2 clear,  no murmur rubs or gallops Chest: clear to auscultation bilaterally, no wheezing, rales or rhonchi Abdomen: soft nontender, nondistended, normal  bowel sounds, no organomegaly Extremities: no cyanosis, clubbing or edema noted bilaterally Neuro: Cranial nerves II-XII intact, no focal neurological deficits  Discharge Instructions   Discharge Instructions    Diet - low sodium heart healthy    Complete by:  As directed      Increase activity slowly    Complete by:  As directed           Current Discharge Medication List    START taking these medications   Details  HYDROcodone-acetaminophen (NORCO/VICODIN) 5-325 MG tablet Take 1-2 tablets by mouth every 4 (four) hours as needed for moderate pain. Qty: 10 tablet, Refills: 0    levofloxacin (LEVAQUIN) 750 MG tablet Take 1 tablet (750 mg total) by mouth daily. Qty: 5 tablet, Refills: 0      CONTINUE these medications which have NOT CHANGED   Details  amLODipine (NORVASC) 5 MG tablet Take 5 mg by mouth daily.    atorvastatin (LIPITOR) 20 MG tablet Take 20 mg by mouth daily at 6 PM.    Cholecalciferol (VITAMIN D3) 2000 UNITS capsule Take 4,000 Units by mouth daily.    fosinopril (MONOPRIL) 20 MG tablet Take 20 mg by mouth daily.    Pioglitazone HCl-Metformin HCl (ACTOPLUS MET XR) 30-1000 MG TB24 Take 1 tablet by mouth daily.    sodium chloride (OCEAN) 0.65 % nasal spray Place 1 spray into the nose at bedtime as needed for congestion.      STOP taking these medications     ibuprofen (ADVIL,MOTRIN) 200 MG tablet        Allergies  Allergen Reactions  . Lipitor [Atorvastatin]     Intolerant to 40 MG due to myalgias, however tolerates 20 MG dose  . Penicillins     Can't recall---Per [facility] records states nausea Has patient had a PCN reaction causing immediate rash, facial/tongue/throat swelling, SOB or lightheadedness with hypotension: unknown Has patient had a PCN reaction causing severe rash involving mucus membranes or skin necrosis: unknown Has patient had a PCN reaction that required hospitalization unknown Has patient had a PCN reaction occurring within the last  10 years: unknown        The results of significant diagnostics from this hospitalization (including imaging, microbiology, ancillary and laboratory) are listed below for reference.    Significant Diagnostic Studies: Dg Chest 2 View  09/07/2015  CLINICAL DATA:  Labored breathing.  Hypoxia. EXAM: CHEST  2 VIEW COMPARISON:  September 05, 2015. FINDINGS: Stable cardiomediastinal silhouette. No pneumothorax or pleural effusion is noted. Right lung is clear. Linear opacity is seen in lingular region which is increased compared to prior exam, consistent with worsening subsegmental atelectasis or possibly pneumonia. Bony thorax is unremarkable. IMPRESSION: Mildly increased lingular subsegmental atelectasis or possibly pneumonia. Electronically Signed   By: Marijo Conception, M.D.   On: 09/07/2015 14:46   Dg Chest 2 View  09/06/2015  CLINICAL DATA:  Shortness of breath. Chest congestion. Nausea. [place] out of chair today. EXAM: CHEST  2 VIEW COMPARISON:  None. FINDINGS: The heart size and mediastinal contours are within normal limits. Mild linear opacity in left lower lung may be due to atelectasis or scarring. No evidence of pulmonary edema or consolidation. No evidence of pneumothorax or hemothorax. The visualized skeletal structures are unremarkable. IMPRESSION: Mild left lower lung atelectasis versus scarring. Electronically Signed   By: Sharrie Rothman.D.  On: 09/06/2015 00:19   Dg Lumbar Spine Complete  09/06/2015  CLINICAL DATA:  Status post fall out of chair, with lower back pain. Pain radiates to the buttocks. Initial encounter. EXAM: LUMBAR SPINE - COMPLETE 4+ VIEW COMPARISON:  Lumbar spine radiographs performed 05/15/2006, and MRI of the lumbar spine performed 05/26/2006 FINDINGS: There is no evidence of fracture or subluxation. Vertebral bodies demonstrate normal height and alignment. Intervertebral disc spaces are preserved. Mild facet disease is noted at the lower lumbar spine. The visualized bowel  gas pattern is unremarkable in appearance; air and stool are noted within the colon. The sacroiliac joints are within normal limits. IMPRESSION: No evidence of fracture or subluxation along the lumbar spine. Electronically Signed   By: Garald Balding M.D.   On: 09/06/2015 00:18   Dg Pelvis 1-2 Views  09/06/2015  CLINICAL DATA:  Fall out of chair.  Pelvic pain.  Initial encounter. EXAM: PELVIS - 1-2 VIEW COMPARISON:  None. FINDINGS: There is no evidence of pelvic fracture or diastasis. No pelvic bone lesions are seen. Generalized osteopenia noted. IMPRESSION: No pelvic fracture identified. Electronically Signed   By: Earle Gell M.D.   On: 09/06/2015 00:17   Ct Head Wo Contrast  09/06/2015  CLINICAL DATA:  Initial evaluation for acute trauma, fall. EXAM: CT HEAD WITHOUT CONTRAST TECHNIQUE: Contiguous axial images were obtained from the base of the skull through the vertex without intravenous contrast. COMPARISON:  Prior study from 09/01/2013. FINDINGS: Generalized cerebral atrophy, greatest within the parietal regions, similar to prior exam. Advanced chronic microvascular ischemic changes present within the periventricular and deep white matter. Vascular calcifications present within the carotid siphons bilaterally. No acute large vessel territory infarct. Mild blurring of the gray-white matter differentiation within the left temporal region favored to be related to motion artifact. No intracranial hemorrhage. No mass lesion, midline shift, or mass effect. No extra-axial fluid collection. Ventricular prominence related to global parenchymal volume loss present without hydrocephalus. No scalp soft tissue abnormality. No acute abnormality about the orbits. Paranasal sinuses are grossly clear, although evaluation limited by motion. No mastoid effusion. Calvarium grossly intact. IMPRESSION: 1. No acute intracranial process. 2. Stable moderate to severe chronic small vessel ischemic disease with cerebral atrophy.  Electronically Signed   By: Jeannine Boga M.D.   On: 09/06/2015 01:35   Ct Chest Wo Contrast  09/08/2015  CLINICAL DATA:  Acute onset of back pain and confusion. Hypoxia. Question of lingular pneumonia on radiograph. Recent fall. Initial encounter. EXAM: CT CHEST WITHOUT CONTRAST TECHNIQUE: Multidetector CT imaging of the chest was performed following the standard protocol without IV contrast. COMPARISON:  Chest radiograph performed earlier today at 2:36 p.m. FINDINGS: Patchy airspace opacities within the left lung may reflect atelectasis or possibly mild pneumonia. Mild right basilar atelectasis is noted. No definite pleural effusion or pneumothorax is seen. No dominant masses are identified. Scattered coronary artery calcifications are seen. Mild calcification is noted along the thoracic aorta and proximal great vessels. No pericardial effusion is identified. Visualized mediastinal nodes are borderline normal in size. The visualized portions of the thyroid gland are grossly unremarkable. No axillary lymphadenopathy is seen. The visualized portions of the liver and spleen are grossly unremarkable. The visualized portions of the pancreas, adrenal glands and gallbladder are within normal limits. A tiny hiatal hernia is suspected. A compression fracture is noted at T12, either acute or subacute in nature, better characterized on concurrent CT of the lumbar spine. Mild anterior bridging osteophytes are noted along the thoracic spine.  IMPRESSION: 1. Patchy airspace opacities within the left lung may reflect atelectasis or possibly mild pneumonia. Mild right basilar atelectasis noted. 2. Scattered coronary artery calcifications seen. 3. Tiny hiatal hernia suspected. 4. Compression fracture at T12, either acute or subacute in nature, better characterized on concurrent CT of the lumbar spine. Electronically Signed   By: Garald Balding M.D.   On: 09/08/2015 02:02   Ct Lumbar Spine Wo Contrast  09/08/2015   CLINICAL DATA:  81 year old female with back pain EXAM: CT LUMBAR SPINE WITHOUT CONTRAST TECHNIQUE: Multidetector CT imaging of the lumbar spine was performed without intravenous contrast administration. Multiplanar CT image reconstructions were also generated. COMPARISON:  Lumbar radiograph dated 09/05/2015 FINDINGS: There is diffuse osteopenia. There is T12 compression fracture with approximately 40% loss of vertebral body height, likely acute or subacute. No definite retropulsed fragment identified. No definite other acute fracture is identified. There is degenerative changes of the spine. Mild aortoiliac atherosclerotic disease. IMPRESSION: T12 compression fracture, likely acute or subacute. Clinical correlation is recommended. No retropulsed fragment identified. Electronically Signed   By: Anner Crete M.D.   On: 09/08/2015 01:01    Microbiology: Recent Results (from the past 240 hour(s))  Blood culture (routine x 2)     Status: None (Preliminary result)   Collection Time: 09/07/15  2:10 PM  Result Value Ref Range Status   Specimen Description BLOOD LEFT HAND  Final   Special Requests BOTTLES DRAWN AEROBIC AND ANAEROBIC 5ML  Final   Culture   Final    NO GROWTH 3 DAYS Performed at [facility]    Report Status PENDING  Incomplete  Blood culture (routine x 2)     Status: None (Preliminary result)   Collection Time: 09/07/15  3:30 PM  Result Value Ref Range Status   Specimen Description BLOOD RIGHT HAND  Final   Special Requests BOTTLES DRAWN AEROBIC AND ANAEROBIC 5CC  Final   Culture   Final    NO GROWTH 3 DAYS Performed at [facility]    Report Status PENDING  Incomplete  Urine culture     Status: None   Collection Time: 09/07/15  4:07 PM  Result Value Ref Range Status   Specimen Description URINE, CATHETERIZED  Final   Special Requests NONE  Final   Culture   Final    >=100,000 COLONIES/mL ESCHERICHIA COLI Performed at [facility]    Report Status  09/09/2015 FINAL  Final   Organism ID, Bacteria ESCHERICHIA COLI  Final      Susceptibility   Escherichia coli - MIC*    AMPICILLIN <=2 SENSITIVE Sensitive     CEFAZOLIN <=4 SENSITIVE Sensitive     CEFTRIAXONE <=1 SENSITIVE Sensitive     CIPROFLOXACIN <=0.25 SENSITIVE Sensitive     GENTAMICIN <=1 SENSITIVE Sensitive     IMIPENEM <=0.25 SENSITIVE Sensitive     NITROFURANTOIN <=16 SENSITIVE Sensitive     TRIMETH/SULFA <=20 SENSITIVE Sensitive     AMPICILLIN/SULBACTAM <=2 SENSITIVE Sensitive     PIP/TAZO <=4 SENSITIVE Sensitive     * >=100,000 COLONIES/mL ESCHERICHIA COLI     Labs: Basic Metabolic Panel:  Recent Labs Lab 09/07/15 1532 09/08/15 0450 09/09/15 0437 09/10/15 0445 09/11/15 0501  NA 138 136 136 133* 131*  K 4.0 4.2 3.7 3.7 3.5  CL 105 102 104 102 99*  CO2 _0 GLUCOSE 235* 204* 189* 164* 166*  BUN _1 CREATININE 0.79 0.85 0.69 0.71 0.72  CALCIUM 9.7 9.5 9.3 9.0 8.8*   Liver Function Tests:  Recent Labs Lab 09/07/15 1532  AST 18  ALT 20  ALKPHOS 88  BILITOT 2.0*  PROT 7.1  ALBUMIN 3.5   No results for input(s): LIPASE, AMYLASE in the last 168 hours. No results for input(s): AMMONIA in the last 168 hours. CBC:  Recent Labs Lab 09/05/15 2342 09/07/15 1414 09/08/15 0450 09/09/15 0437 09/10/15 0445 09/11/15 0501  WBC 16.0* 14.4* 11.5* 10.5 9.2 9.2  NEUTROABS 14.5* 12.2*  --   --   --   --   HGB 16.2* 15.5* 14.6 14.0 13.9 13.0  HCT 46.8* 44.9 41.6 41.8 41.6 39.3  MCV 89.8 90.2 91.2 89.9 90.0 90.6  PLT 222 211 191 225 226 212   Cardiac Enzymes: No results for input(s): CKTOTAL, CKMB, CKMBINDEX, TROPONINI in the last 168 hours. BNP: BNP (last 3 results) No results for input(s): BNP in the last 8760 hours.  ProBNP (last 3 results) No results for input(s): PROBNP in the last 8760 hours.  CBG:  Recent Labs Lab 09/10/15 0723 09/10/15 1148 09/10/15 1619 09/10/15 2149 09/11/15 0720  GLUCAP 156* 207* 177* 148* 174*        Signed:  Hawley Pavia A  [facility] 09/11/2015, 7:56 AM

## 2015-09-12 ENCOUNTER — Non-Acute Institutional Stay: Payer: Medicare PPO | Admitting: Internal Medicine

## 2015-09-12 DIAGNOSIS — B9620 Unspecified Escherichia coli [E. coli] as the cause of diseases classified elsewhere: Secondary | ICD-10-CM | POA: Diagnosis not present

## 2015-09-12 DIAGNOSIS — J189 Pneumonia, unspecified organism: Secondary | ICD-10-CM

## 2015-09-12 DIAGNOSIS — E119 Type 2 diabetes mellitus without complications: Secondary | ICD-10-CM

## 2015-09-12 DIAGNOSIS — I10 Essential (primary) hypertension: Secondary | ICD-10-CM

## 2015-09-12 DIAGNOSIS — N390 Urinary tract infection, site not specified: Secondary | ICD-10-CM

## 2015-09-12 DIAGNOSIS — E785 Hyperlipidemia, unspecified: Secondary | ICD-10-CM | POA: Diagnosis not present

## 2015-09-12 DIAGNOSIS — M4854XS Collapsed vertebra, not elsewhere classified, thoracic region, sequela of fracture: Secondary | ICD-10-CM

## 2015-09-12 DIAGNOSIS — B373 Candidiasis of vulva and vagina: Secondary | ICD-10-CM | POA: Diagnosis not present

## 2015-09-12 DIAGNOSIS — B3731 Acute candidiasis of vulva and vagina: Secondary | ICD-10-CM

## 2015-09-12 DIAGNOSIS — R531 Weakness: Secondary | ICD-10-CM

## 2015-09-12 DIAGNOSIS — S22080S Wedge compression fracture of T11-T12 vertebra, sequela: Secondary | ICD-10-CM

## 2015-09-12 LAB — BASIC METABOLIC PANEL
BUN: 17 mg/dL (ref 4–21)
Creatinine: 0.8 mg/dL (ref <=1.1)
GLUCOSE: 134 mg/dL
Sodium: 141 mmol/L (ref 137–147)

## 2015-09-12 LAB — CULTURE, BLOOD (ROUTINE X 2)
CULTURE: NO GROWTH
Culture: NO GROWTH

## 2015-09-12 LAB — HEPATIC FUNCTION PANEL
ALK PHOS: 117 U/L (ref 25–125)
ALT: 13 U/L (ref 7–35)
AST: 11 U/L — AB (ref 13–35)
BILIRUBIN, TOTAL: 0.8 mg/dL

## 2015-09-12 NOTE — Progress Notes (Signed)
Patient ID: Rachel Rangel, female   DOB: [birth year], 81 y.o.   MRN: [id number]     Facility: [facility]    PCP: PROVIDER NOT IN SYSTEM  Code Status: full code  Allergies  Allergen Reactions  . Lipitor [Atorvastatin]     Intolerant to 40 MG due to myalgias, however tolerates 20 MG dose  . Penicillins     Can't recall---Per [facility] records states nausea Has patient had a PCN reaction causing immediate rash, facial/tongue/throat swelling, SOB or lightheadedness with hypotension: unknown Has patient had a PCN reaction causing severe rash involving mucus membranes or skin necrosis: unknown Has patient had a PCN reaction that required hospitalization unknown Has patient had a PCN reaction occurring within the last 10 years: unknown      Chief Complaint  Patient presents with  . New Admit To SNF     HPI:  81 y.o. patient is here for short term rehabilitation post hospital admission from 09/07/15-09/11/15 with acute respiratory failure with hypoxia. She was started on antibiotics for community acquired pneumonia. She also had e.coli UTI and T12 compression fracture. She was started on pain management. She has PMH of DM, HTN. She is seen in her room today. She complaints of vaginal itching and discharge. Blood pressure reading have been elevated per nursing staff.   Review of Systems:  Constitutional: Negative for fever, diaphoresis.  HENT: Negative for headache, congestion, nasal discharge Eyes: Negative for blurred vision, double vision and discharge.  Respiratory: Negative for cough, shortness of breath and wheezing.   Cardiovascular: Negative for chest pain, palpitations, leg swelling.  Gastrointestinal: Negative for heartburn, nausea, vomiting, abdominal pain. Had bowel movement yesterday Genitourinary: Negative for dysuria Musculoskeletal: Negative for fall Skin: Negative for itching, rash.  Neurological: Negative for  dizziness Psychiatric/Behavioral: Negative for depression   Past Medical History  Diagnosis Date  . Diabetes mellitus without complication ([place])   . Benign hypertension   . Hyperlipidemia   . Vitamin D deficiency   . Overactive bladder   . Osteoporosis 2010  . History of complete eye exam 12/25/2012    Normal eye exam, no retinopathy [facility]  . Edema     Left pedal   . Vitamin D deficiency    Past Surgical History  Procedure Laterality Date  . Abdominal hysterectomy  1975    partial   Social History:   reports that she has been smoking.  She does not have any smokeless tobacco history on file. She reports that she drinks alcohol. Her drug history is not on file.  Family History  Problem Relation Age of Onset  . Pancreatic cancer Mother   . Glaucoma Mother   . Hypertension Father   . Glaucoma Maternal Aunt   . Hypertension Maternal Grandmother   . Glaucoma Maternal Grandfather   . Hypertension Paternal Grandmother   . Hypertension Paternal Grandfather   . Glaucoma Maternal Aunt     Medications:   Medication List       This list is accurate as of: 09/12/15  1:40 PM.  Always use your most recent med list.               ACTOPLUS MET XR 30-1000 MG Tb24  Generic drug:  Pioglitazone HCl-Metformin HCl  Take 1 tablet by mouth daily.     amLODipine 5 MG tablet  Commonly known as:  NORVASC  Take 5 mg by mouth daily.     atorvastatin 20 MG tablet  Commonly  known as:  LIPITOR  Take 20 mg by mouth daily at 6 PM.     fosinopril 20 MG tablet  Commonly known as:  MONOPRIL  Take 20 mg by mouth daily.     HYDROcodone-acetaminophen 5-325 MG tablet  Commonly known as:  NORCO/VICODIN  Take 1-2 tablets by mouth every 4 (four) hours as needed for moderate pain.     levofloxacin 750 MG tablet  Commonly known as:  LEVAQUIN  Take 1 tablet (750 mg total) by mouth daily.     sodium chloride 0.65 % nasal spray  Commonly known as:  OCEAN  Place 1 spray into the  nose at bedtime as needed for congestion.     Vitamin D3 2000 UNITS capsule  Take 4,000 Units by mouth daily.         Physical Exam: Filed Vitals:   09/12/15 1338  BP: 160/65  Pulse: 73  Temp: 99 F (37.2 C)  Resp: 18  SpO2: 93%    General- elderly female, in no acute distress Head- normocephalic, atraumatic Nose- no maxillary or frontal sinus tenderness, no nasal discharge Throat- moist mucus membrane  Eyes- no pallor, no icterus, no discharge, normal conjunctiva, normal sclera Neck- no cervical lymphadenopathy Cardiovascular- normal s1,s2, no murmurs Respiratory- bilateral clear to auscultation, no wheeze, no rhonchi, no crackles Abdomen- bowel sounds present, soft, non tender Musculoskeletal- able to move all 4 extremities, generalized weakness  Neurological- alert and oriented to person, place and time Skin- warm and dry   Labs reviewed: Basic Metabolic Panel:  Recent Labs  09/09/15 0437 09/10/15 0445 09/11/15 0501  NA 136 133* 131*  K 3.7 3.7 3.5  CL 104 102 99*  CO2 23 23 25   GLUCOSE 189* 164* 166*  BUN 15 14 15   CREATININE 0.69 0.71 0.72  CALCIUM 9.3 9.0 8.8*   Liver Function Tests:  Recent Labs  09/07/15 1532  AST 18  ALT 20  ALKPHOS 88  BILITOT 2.0*  PROT 7.1  ALBUMIN 3.5   No results for input(s): LIPASE, AMYLASE in the last 8760 hours. No results for input(s): AMMONIA in the last 8760 hours. CBC:  Recent Labs  09/05/15 2342 09/07/15 1414  09/09/15 0437 09/10/15 0445 09/11/15 0501  WBC 16.0* 14.4*  < > 10.5 9.2 9.2  NEUTROABS 14.5* 12.2*  --   --   --   --   HGB 16.2* 15.5*  < > 14.0 13.9 13.0  HCT 46.8* 44.9  < > 41.8 41.6 39.3  MCV 89.8 90.2  < > 89.9 90.0 90.6  PLT 222 211  < > 225 226 212  < > = values in this interval not displayed. Cardiac Enzymes: No results for input(s): CKTOTAL, CKMB, CKMBINDEX, TROPONINI in the last 8760 hours. BNP: Invalid input(s): POCBNP CBG:  Recent Labs  09/11/15 0720 09/11/15 1202  09/11/15 1701  GLUCAP 174* 237* 218*    Radiological Exams: Dg Chest 2 View  09/07/2015  CLINICAL DATA:  Labored breathing.  Hypoxia. EXAM: CHEST  2 VIEW COMPARISON:  September 05, 2015. FINDINGS: Stable cardiomediastinal silhouette. No pneumothorax or pleural effusion is noted. Right lung is clear. Linear opacity is seen in lingular region which is increased compared to prior exam, consistent with worsening subsegmental atelectasis or possibly pneumonia. Bony thorax is unremarkable. IMPRESSION: Mildly increased lingular subsegmental atelectasis or possibly pneumonia. Electronically Signed   By: Marijo Conception, M.D.   On: 09/07/2015 14:46   Ct Chest Wo Contrast  09/08/2015  CLINICAL DATA:  Acute onset of back pain and confusion. Hypoxia. Question of lingular pneumonia on radiograph. Recent fall. Initial encounter. EXAM: CT CHEST WITHOUT CONTRAST TECHNIQUE: Multidetector CT imaging of the chest was performed following the standard protocol without IV contrast. COMPARISON:  Chest radiograph performed earlier today at 2:36 p.m. FINDINGS: Patchy airspace opacities within the left lung may reflect atelectasis or possibly mild pneumonia. Mild right basilar atelectasis is noted. No definite pleural effusion or pneumothorax is seen. No dominant masses are identified. Scattered coronary artery calcifications are seen. Mild calcification is noted along the thoracic aorta and proximal great vessels. No pericardial effusion is identified. Visualized mediastinal nodes are borderline normal in size. The visualized portions of the thyroid gland are grossly unremarkable. No axillary lymphadenopathy is seen. The visualized portions of the liver and spleen are grossly unremarkable. The visualized portions of the pancreas, adrenal glands and gallbladder are within normal limits. A tiny hiatal hernia is suspected. A compression fracture is noted at T12, either acute or subacute in nature, better characterized on concurrent CT  of the lumbar spine. Mild anterior bridging osteophytes are noted along the thoracic spine. IMPRESSION: 1. Patchy airspace opacities within the left lung may reflect atelectasis or possibly mild pneumonia. Mild right basilar atelectasis noted. 2. Scattered coronary artery calcifications seen. 3. Tiny hiatal hernia suspected. 4. Compression fracture at T12, either acute or subacute in nature, better characterized on concurrent CT of the lumbar spine. Electronically Signed   By: Garald Balding M.D.   On: 09/08/2015 02:02   Ct Lumbar Spine Wo Contrast  09/08/2015  CLINICAL DATA:  81 year old female with back pain EXAM: CT LUMBAR SPINE WITHOUT CONTRAST TECHNIQUE: Multidetector CT imaging of the lumbar spine was performed without intravenous contrast administration. Multiplanar CT image reconstructions were also generated. COMPARISON:  Lumbar radiograph dated 09/05/2015 FINDINGS: There is diffuse osteopenia. There is T12 compression fracture with approximately 40% loss of vertebral body height, likely acute or subacute. No definite retropulsed fragment identified. No definite other acute fracture is identified. There is degenerative changes of the spine. Mild aortoiliac atherosclerotic disease. IMPRESSION: T12 compression fracture, likely acute or subacute. Clinical correlation is recommended. No retropulsed fragment identified. Electronically Signed   By: Anner Crete M.D.   On: 09/08/2015 01:01     Assessment/Plan  Generalized weakness Will have her work with physical therapy and occupational therapy team to help with gait training and muscle strengthening exercises.fall precautions. Skin care. Encourage to be out of bed.   CAP Continue levaquin until 09/16/15. Breathing stable. Monitor clinically. Will have patient work with PT/OT as tolerated to regain strength and restore function.  Fall precautions are in place.  E.coli uti Continue and complete levaquin course, maintain hydration  Vaginal  candidiasis Start fluconazole 100 mg daily x 3 days, perineal hygiene  HTN Elevated BP, increase norvasc to 10 mg daily, monitor BP reading, continue monopril. check bmp  T12 compression fracture Continue norco 5-325 mg 1-2 tab q4h prn pain and to work with therapy team. Back precautions. Continue vitamin d supplement  HLD Continue lipitor  DM Monitor cbg, continue pioglitazone metformin. Continue statin and ACEI   Goals of care: short term rehabilitation   Labs/tests ordered: cbc, cmp  Family/ staff Communication: reviewed care plan with patient and nursing supervisor    Blanchie Serve, MD  [facility] [phone] (Monday-Friday 8 am - 5 pm) [phone] (afterhours)

## 2015-09-18 LAB — CBC AND DIFFERENTIAL
HEMATOCRIT: 42 % (ref 36–46)
HEMOGLOBIN: 13.4 g/dL (ref 12.0–16.0)
PLATELETS: 288 10*3/uL (ref 150–399)
WBC: 8.4 10*3/mL

## 2015-10-09 ENCOUNTER — Encounter: Payer: Self-pay | Admitting: Nurse Practitioner

## 2015-10-09 ENCOUNTER — Non-Acute Institutional Stay (SKILLED_NURSING_FACILITY): Payer: Medicare Other | Admitting: Nurse Practitioner

## 2015-10-09 DIAGNOSIS — S22080D Wedge compression fracture of T11-T12 vertebra, subsequent encounter for fracture with routine healing: Secondary | ICD-10-CM

## 2015-10-09 DIAGNOSIS — R413 Other amnesia: Secondary | ICD-10-CM

## 2015-10-09 DIAGNOSIS — N390 Urinary tract infection, site not specified: Secondary | ICD-10-CM

## 2015-10-09 DIAGNOSIS — J189 Pneumonia, unspecified organism: Secondary | ICD-10-CM | POA: Diagnosis not present

## 2015-10-09 DIAGNOSIS — E119 Type 2 diabetes mellitus without complications: Secondary | ICD-10-CM | POA: Diagnosis not present

## 2015-10-09 DIAGNOSIS — M4854XD Collapsed vertebra, not elsewhere classified, thoracic region, subsequent encounter for fracture with routine healing: Secondary | ICD-10-CM | POA: Diagnosis not present

## 2015-10-09 NOTE — Progress Notes (Signed)
Nursing Home Location:  [place] of Service: SNF (31)  PCP: PROVIDER NOT IN SYSTEM  Allergies  Allergen Reactions  . Lipitor [Atorvastatin]     Intolerant to 40 MG due to myalgias, however tolerates 20 MG dose  . Penicillins     Can't recall---Per [facility] records states nausea Has patient had a PCN reaction causing immediate rash, facial/tongue/throat swelling, SOB or lightheadedness with hypotension: unknown Has patient had a PCN reaction causing severe rash involving mucus membranes or skin necrosis: unknown Has patient had a PCN reaction that required hospitalization unknown Has patient had a PCN reaction occurring within the last 10 years: unknown      Chief Complaint  Patient presents with  . Discharge Note    HPI:  Patient is a 82 y.o. female seen today at [facility] for discharge home. She has PMH of DM, HTN, hyperlipidemia, memory loss, OP, OAB. Pt  here for short term rehabilitation after hospitalization from 09/07/15-09/11/15 with acute respiratory failure with hypoxia found to have pneumonia and was treated with antibiotics.. She also was treated for e.coli UTI and noted to have T12 compression fracture. Patient currently doing well with therapy, now stable to discharge home with home health.   Review of Systems:  Review of Systems  Constitutional: Negative for activity change, appetite change, fatigue and unexpected weight change.  HENT: Negative for congestion and hearing loss.   Eyes: Negative.   Respiratory: Negative for cough and shortness of breath.   Cardiovascular: Negative for chest pain, palpitations and leg swelling.  Gastrointestinal: Negative for abdominal pain, diarrhea and constipation.  Genitourinary: Negative for dysuria and difficulty urinating.  Musculoskeletal: Negative for myalgias and arthralgias.  Skin: Negative for color change and wound.  Neurological: Negative for dizziness and weakness.    Psychiatric/Behavioral: Negative for behavioral problems, confusion and agitation.    Past Medical History  Diagnosis Date  . Diabetes mellitus without complication ([place])   . Benign hypertension   . Hyperlipidemia   . Vitamin D deficiency   . Overactive bladder   . Osteoporosis 2010  . History of complete eye exam 12/25/2012    Normal eye exam, no retinopathy [facility]  . Edema     Left pedal   . Vitamin D deficiency    Past Surgical History  Procedure Laterality Date  . Abdominal hysterectomy  1975    partial   Social History:   reports that she has been smoking.  She does not have any smokeless tobacco history on file. She reports that she drinks alcohol. Her drug history is not on file.  Family History  Problem Relation Age of Onset  . Pancreatic cancer Mother   . Glaucoma Mother   . Hypertension Father   . Glaucoma Maternal Aunt   . Hypertension Maternal Grandmother   . Glaucoma Maternal Grandfather   . Hypertension Paternal Grandmother   . Hypertension Paternal Grandfather   . Glaucoma Maternal Aunt     Medications: Patient's Medications  New Prescriptions   No medications on file  Previous Medications   AMLODIPINE (NORVASC) 5 MG TABLET    Take 10 mg by mouth daily. Hold for SBP , 110   ATORVASTATIN (LIPITOR) 20 MG TABLET    Take 20 mg by mouth daily at 6 PM.   CHOLECALCIFEROL (VITAMIN D3) 2000 UNITS CAPSULE    Take 4,000 Units by mouth daily.   HYDROCODONE-ACETAMINOPHEN (NORCO/VICODIN) 5-325 MG TABLET  Take 1-2 tablets by mouth every 4 (four) hours as needed for moderate pain.   LISINOPRIL (PRINIVIL,ZESTRIL) 20 MG TABLET    Take 20 mg by mouth daily.   PIOGLITAZONE HCL-METFORMIN HCL (ACTOPLUS MET XR) 30-1000 MG TB24    Take 1 tablet by mouth daily.   SODIUM CHLORIDE (OCEAN) 0.65 % NASAL SPRAY    Place 1 spray into the nose at bedtime as needed for congestion.  Modified Medications   No medications on file  Discontinued Medications   FOSINOPRIL  (MONOPRIL) 20 MG TABLET    Take 20 mg by mouth daily.   LEVOFLOXACIN (LEVAQUIN) 750 MG TABLET    Take 1 tablet (750 mg total) by mouth daily.     Physical Exam: Filed Vitals:   10/09/15 1306  BP: 129/52  Pulse: 57  Temp: 98 F (36.7 C)  Resp: 18  SpO2: 95%    Physical Exam  Constitutional: She is oriented to person, place, and time. She appears well-developed and well-nourished. No distress.  HENT:  Head: Normocephalic and atraumatic.  Mouth/Throat: Oropharynx is clear and moist. No oropharyngeal exudate.  Eyes: Conjunctivae are normal. Pupils are equal, round, and reactive to light.  Neck: Normal range of motion. Neck supple.  Cardiovascular: Normal rate, regular rhythm and normal heart sounds.   Pulmonary/Chest: Effort normal and breath sounds normal.  Abdominal: Soft. Bowel sounds are normal.  Musculoskeletal: She exhibits no edema or tenderness.  Neurological: She is alert and oriented to person, place, and time.  Skin: Skin is warm and dry. She is not diaphoretic.  Psychiatric: She has a normal mood and affect.    Labs reviewed: Basic Metabolic Panel:  Recent Labs  09/09/15 0437 09/10/15 0445 09/11/15 0501 09/12/15  NA 136 133* 131* 141  K 3.7 3.7 3.5  --   CL 104 102 99*  --   CO2 _0 --   GLUCOSE 189* 164* 166*  --   BUN _1 CREATININE 0.69 0.71 0.72 0.8  CALCIUM 9.3 9.0 8.8*  --    Liver Function Tests:  Recent Labs  09/07/15 1532 09/12/15  AST 18 11*  ALT 20 13  ALKPHOS 88 117  BILITOT 2.0*  --   PROT 7.1  --   ALBUMIN 3.5  --    No results for input(s): LIPASE, AMYLASE in the last 8760 hours. No results for input(s): AMMONIA in the last 8760 hours. CBC:  Recent Labs  09/05/15 2342 09/07/15 1414  09/09/15 0437 09/10/15 0445 09/11/15 0501 09/18/15  WBC 16.0* 14.4*  < > 10.5 9.2 9.2 8.4  NEUTROABS 14.5* 12.2*  --   --   --   --   --   HGB 16.2* 15.5*  < > 14.0 13.9 13.0 13.4  HCT 46.8* 44.9  < > 41.8 41.6 39.3 42  MCV  89.8 90.2  < > 89.9 90.0 90.6  --   PLT 222 211  < > 225 226 212 288  < > = values in this interval not displayed. TSH:  Recent Labs  09/07/15 1835  TSH 2.077   A1C: Lab Results  Component Value Date   HGBA1C 7.6* 09/07/2015   Lipid Panel: No results for input(s): CHOL, HDL, LDLCALC, TRIG, CHOLHDL, LDLDIRECT in the last 8760 hours.  Radiological Exams: Dg Chest 2 View  09/07/2015  CLINICAL DATA:  Labored breathing.  Hypoxia. EXAM: CHEST  2 VIEW COMPARISON:  September 05, 2015. FINDINGS: Stable cardiomediastinal silhouette. No pneumothorax  or pleural effusion is noted. Right lung is clear. Linear opacity is seen in lingular region which is increased compared to prior exam, consistent with worsening subsegmental atelectasis or possibly pneumonia. Bony thorax is unremarkable. IMPRESSION: Mildly increased lingular subsegmental atelectasis or possibly pneumonia. Electronically Signed   By: Marijo Conception, M.D.   On: 09/07/2015 14:46   Ct Chest Wo Contrast  09/08/2015  CLINICAL DATA:  Acute onset of back pain and confusion. Hypoxia. Question of lingular pneumonia on radiograph. Recent fall. Initial encounter. EXAM: CT CHEST WITHOUT CONTRAST TECHNIQUE: Multidetector CT imaging of the chest was performed following the standard protocol without IV contrast. COMPARISON:  Chest radiograph performed earlier today at 2:36 p.m. FINDINGS: Patchy airspace opacities within the left lung may reflect atelectasis or possibly mild pneumonia. Mild right basilar atelectasis is noted. No definite pleural effusion or pneumothorax is seen. No dominant masses are identified. Scattered coronary artery calcifications are seen. Mild calcification is noted along the thoracic aorta and proximal great vessels. No pericardial effusion is identified. Visualized mediastinal nodes are borderline normal in size. The visualized portions of the thyroid gland are grossly unremarkable. No axillary lymphadenopathy is seen. The  visualized portions of the liver and spleen are grossly unremarkable. The visualized portions of the pancreas, adrenal glands and gallbladder are within normal limits. A tiny hiatal hernia is suspected. A compression fracture is noted at T12, either acute or subacute in nature, better characterized on concurrent CT of the lumbar spine. Mild anterior bridging osteophytes are noted along the thoracic spine. IMPRESSION: 1. Patchy airspace opacities within the left lung may reflect atelectasis or possibly mild pneumonia. Mild right basilar atelectasis noted. 2. Scattered coronary artery calcifications seen. 3. Tiny hiatal hernia suspected. 4. Compression fracture at T12, either acute or subacute in nature, better characterized on concurrent CT of the lumbar spine. Electronically Signed   By: Garald Balding M.D.   On: 09/08/2015 02:02   Ct Lumbar Spine Wo Contrast  09/08/2015  CLINICAL DATA:  82 year old female with back pain EXAM: CT LUMBAR SPINE WITHOUT CONTRAST TECHNIQUE: Multidetector CT imaging of the lumbar spine was performed without intravenous contrast administration. Multiplanar CT image reconstructions were also generated. COMPARISON:  Lumbar radiograph dated 09/05/2015 FINDINGS: There is diffuse osteopenia. There is T12 compression fracture with approximately 40% loss of vertebral body height, likely acute or subacute. No definite retropulsed fragment identified. No definite other acute fracture is identified. There is degenerative changes of the spine. Mild aortoiliac atherosclerotic disease. IMPRESSION: T12 compression fracture, likely acute or subacute. Clinical correlation is recommended. No retropulsed fragment identified. Electronically Signed   By: Anner Crete M.D.   On: 09/08/2015 01:01    Assessment/Plan 1. Community acquired pneumonia Resolved, completed course of Levaquin, no ongoing symptoms    2. UTI (lower urinary tract infection) Without ongoing symptoms, completed  levaquin  3. Controlled type 2 diabetes mellitus without complication, without long-term current use of insulin (HCC) Stable. conts on actos with metformin by mouth   4. T12 compression fracture Pain is stable, opted for conservative management   5. Memory loss Noted by staff, pt will be going home with 24 hour care, will need to be worked up further by PCP  pt is stable for discharge-will need PT/OT/HHA per home health. DME needed includes FWW. Rx written.  will need to follow up with PCP within 2 weeks.   Carlos American. Harle Battiest  [facility] [phone] 8 am - 5 pm) [phone] (after hours)

## 2015-11-28 ENCOUNTER — Ambulatory Visit
Admission: RE | Admit: 2015-11-28 | Discharge: 2015-11-28 | Disposition: A | Discharge status: Home / Self Care | Payer: Medicare Other | Source: Ambulatory Visit | Attending: Family Medicine | Admitting: Family Medicine

## 2015-11-28 ENCOUNTER — Other Ambulatory Visit: Payer: Self-pay | Admitting: Family Medicine

## 2015-11-28 DIAGNOSIS — R059 Cough, unspecified: Secondary | ICD-10-CM

## 2015-11-28 DIAGNOSIS — R05 Cough: Secondary | ICD-10-CM

## 2015-12-01 ENCOUNTER — Encounter: Payer: Self-pay | Admitting: Internal Medicine

## 2015-12-12 ENCOUNTER — Encounter: Payer: Self-pay | Admitting: Neurology

## 2015-12-12 ENCOUNTER — Ambulatory Visit (INDEPENDENT_AMBULATORY_CARE_PROVIDER_SITE_OTHER): Payer: Medicare Other | Admitting: Neurology

## 2015-12-12 VITALS — BP 116/60 | HR 59 | Ht 62.0 in | Wt 164.0 lb

## 2015-12-12 DIAGNOSIS — R410 Disorientation, unspecified: Secondary | ICD-10-CM

## 2015-12-12 DIAGNOSIS — N949 Unspecified condition associated with female genital organs and menstrual cycle: Secondary | ICD-10-CM | POA: Diagnosis not present

## 2015-12-12 DIAGNOSIS — R102 Pelvic and perineal pain: Secondary | ICD-10-CM

## 2015-12-12 DIAGNOSIS — F0390 Unspecified dementia without behavioral disturbance: Secondary | ICD-10-CM | POA: Diagnosis not present

## 2015-12-12 DIAGNOSIS — F05 Delirium due to known physiological condition: Secondary | ICD-10-CM

## 2015-12-12 DIAGNOSIS — R413 Other amnesia: Secondary | ICD-10-CM | POA: Diagnosis not present

## 2015-12-12 DIAGNOSIS — E538 Deficiency of other specified B group vitamins: Secondary | ICD-10-CM | POA: Diagnosis not present

## 2015-12-12 NOTE — Progress Notes (Addendum)
[facility]    Provider:  Dr Jaynee Eagles Referring Provider: Antony Contras, MD Primary Care Physician:  Gara Kroner, MD  CC:  dementia  HPI:  Rachel Rangel is a 82 y.o. female here as a referral from Dr. Moreen Fowler for memory loss. Past medical history hypertension, hyperlipidemia, diabetes, osteoporosis, dementia. Daughter is here and provides most information. She says patient is having vivid dreams and can't tell the dream from reality. Patient says she was at daughter's house all day and really was not. There are personality changes. Daughter found patient in December after she fell, found her on the floor after 10 hours and the kitchen was a disaster and the brathroom was a disaster, laundry piled up, dirty dishes everywhere including the fridge with unrecognizable contents and mold. Daughter is 10 minutes away. She would talk to mother every day but not go over often. Housekeeping has been declining for some time.  Patient says she just didn't feel like doing the dishes or anything. Daughter believes there is depression.  Patient started  sleeping a lot. Normally is up early. Daughter thought she was having hearing problems but mother was probably not understanding conversations for some times. Patient had been to the house 3 months previous and possibly mother was suffering from depression. Patient says she just didn't want to do anything. Father has been dead for 10 years. Daughter took over the bills 2 years ago because mother had not even been opening the mail. She used to paint but hasn't painted in years. She would go out with her friends every Saturday. She would see the family on special occassioinas and holidays. After she fell she was hospitalized and then went to rehab and is now in an assisted living facility. Patient has her own place. She has been there for 2 months. She goes to the dining area 3x a day. Patient is sometimes confused. She shrugs a lot when she doesn't  understand conversations. She points her finger and just smiles. Patient doesn't remember daughter's birthday. Patient feels like she doesn't have energy. Patient doesn' t make phone calls anymore either.   Talk to Lenna Sciara or Sharee Pimple when calling facility. Daughter's phone #[phone].  Personally reviewed CT of the head December 2016 which showed severe chronic small vessel ischemic disease as well as advanced atrophy or pronounced in the mesial temporal lobes.  Reviewed notes, labs and imaging from outside physicians, which showed  Labs 11/28/2015 include unremarkable CMP with creatinine 0.91 and LDL 81, hemoglobin A1c 6.2. CBC unremarkable. A depression screening interpretation was minimal depression.  Review of Systems: Patient complains of symptoms per HPI as well as the following symptoms: Memory loss and confusion. Pertinent negatives per HPI. All others negative.   Social History   Social History  . Marital Status: Widowed    Spouse Name: N/A  . Number of Children: 1  . Years of Education: College   Occupational History  . Retired Pharmacist, hospital    Social History Main Topics  . Smoking status: Current Every Day Smoker -- 0.50 packs/day    Types: Cigarettes    Last Attempt to Quit: 09/30/2014  . Smokeless tobacco: Not on file  . Alcohol Use: 0.0 oz/week    0 Standard drinks or equivalent per week     Comment: ocass  . Drug Use: No  . Sexual Activity: Not on file   Other Topics Concern  . Not on file   Social History Narrative   Lives  at [facility] [place]. Moved there in January 2017.       Caffeine use: ocass    Family History  Problem Relation Age of Onset  . Pancreatic cancer Mother   . Glaucoma Mother   . Hypertension Father   . Glaucoma Maternal Aunt   . Hypertension Maternal Grandmother   . Glaucoma Maternal Grandfather   . Hypertension Paternal Grandmother   . Hypertension Paternal Grandfather   . Glaucoma Maternal Aunt   . Dementia Neg Hx      Past Medical History  Diagnosis Date  . Diabetes mellitus without complication ([place])   . Benign hypertension   . Hyperlipidemia   . Vitamin D deficiency   . Overactive bladder   . Osteoporosis 2010  . History of complete eye exam 12/25/2012    Normal eye exam, no retinopathy [facility]  . Edema     Left pedal   . Vitamin D deficiency     Past Surgical History  Procedure Laterality Date  . Abdominal hysterectomy  1975    partial    Current Outpatient Prescriptions  Medication Sig Dispense Refill  . Acetaminophen (APAP 500 PO) Take 1 tablet by mouth every 6 (six) hours as needed.    Marland Kitchen amLODipine (NORVASC) 10 MG tablet Take 10 mg by mouth daily.    Marland Kitchen atorvastatin (LIPITOR) 20 MG tablet Take 20 mg by mouth daily at 6 PM.    . Cholecalciferol (VITAMIN D3) 2000 UNITS capsule Take 4,000 Units by mouth daily.    Marland Kitchen ibuprofen (ADVIL,MOTRIN) 200 MG tablet Take 200 mg by mouth every 6 (six) hours as needed.    Marland Kitchen lisinopril (PRINIVIL,ZESTRIL) 20 MG tablet Take 20 mg by mouth daily.    Marland Kitchen nystatin cream (MYCOSTATIN) Apply 1 application topically 2 (two) times daily. To groin and buttocks    . Pioglitazone HCl-Metformin HCl (ACTOPLUS MET XR) 30-1000 MG TB24 Take 1 tablet by mouth daily.    . sodium chloride (OCEAN) 0.65 % nasal spray Place 1 spray into the nose at bedtime as needed for congestion.     No current facility-administered medications for this visit.    Allergies as of 12/12/2015 - Review Complete 10/09/2015  Allergen Reaction Noted  . Lipitor [atorvastatin]  07/12/2014  . Penicillins  09/01/2013    Vitals: BP 116/60 mmHg  Pulse 59  Ht 5' 2" (1.575 m)  Wt 164 lb (74.39 kg)  BMI 29.99 kg/m2 Last Weight:  Wt Readings from Last 1 Encounters:  12/12/15 164 lb (74.39 kg)   Last Height:   Ht Readings from Last 1 Encounters:  12/12/15 5' 2" (1.575 m)   Physical exam: Exam: Gen: NAD, conversant, well nourised, obese, well groomed                     CV: RRR,  no MRG. No Carotid Bruits.  Eyes: Conjunctivae clear without exudates or hemorrhage  Neuro: Detailed Neurologic Exam  Speech:    Speech is normal; fluent and spontaneous with impaired comprehension.  Cognition: MMSE - Mini Mental State Exam 12/12/2015  Orientation to time 3  Orientation to Place 4  Registration 3  Attention/ Calculation 5  Recall 1  Language- name 2 objects 2  Language- repeat 1  Language- follow 3 step command 3  Language- read & follow direction 1  Write a sentence 1  Copy design 0  Total score 24      recent and remote memory impaired;  language fluent;     Impaired attention, concentration,     fund of knowledge impaired Cranial Nerves:    The pupils are equal, round, and reactive to light.  Attempted funduscopic evaluation could not visualize. Visual fields are full to waving fingers. Extraocular movements are intact. Trigeminal sensation is intact and the muscles of mastication are normal. The face is symmetric. The palate elevates in the midline. Hearing intact to voice. Voice is normal. Shoulder shrug is normal. The tongue has normal motion without fasciculations.   Coordination:    Normal finger to nose, cant do HTS due to back painheel to shin. .   Gait:    Antalgic with walker, not shuffling, slow and cautious, poor foot clearance  Motor Observation:    No asymmetry, no atrophy, and no involuntary movements noted. Tone:    Normal muscle tone.    Posture:    Posture is slightly stooped    Strength:ifficult to perform for motor testing however  left leg prox weakness 2/5 (pain) otherwise antigravity and symmetric          Sensation: intact to LT     Reflex Exam:  DTR's:  Toes: right up left equiv Clonus:    Clonus is absent.       Assessment/Plan:   82 y.o. female here as a referral from Dr. Moreen Fowler for memory loss. Past medical history hypertension, hyperlipidemia, diabetes, osteoporosis, dementia. Today's history, physical and  exam demonstrated very substantial and measurable cognitive losses consistent with dementia. Personally reviewed CT of the head December 2016 which showed severe chronWhich raises the possibility of vascular dementiaic small vessel ischemic disease as well as advanced atrophy or pronounced in the mesial temporal lobes consistent with memory loss.   Talk to Lenna Sciara or Sharee Pimple when calling facility. Daughter's phone [phone].  As far as your medications are concerned, I would like to suggest Aricept 43m for one week then increase to 161m SIDE EFFECTS: Nausea, vomiting, diarrhea, loss oWhich isf appetite/weight loss, dizziness, drowsiness, weakness, trouble sleeping, shakiness (tremor), or muscle cramps. More serious side effects can include AV block, bradycardia, syncope, seizures, GI bleeding, rash.  In one month would also start Celexa 102maily for possible depression.   As far as diagnostic testing: MRi of the brain, B12 lab  XR of the pelvis given several days of pain palpable in the lower left posterior pelvis.  I would like to see you back in 4 months, sooner if we need to. Please call us [place] any interim questions, concerns, problems, updates or refill requests.   AntSarina IllD  [facility] [id number]iDanieleNeahkahnieC [id number] [phone]

## 2015-12-12 NOTE — Patient Instructions (Signed)
Remember to drink plenty of fluid, eat healthy meals and do not skip any meals. Try to eat protein with a every meal and eat a healthy snack such as fruit or nuts in between meals. Try to keep a regular sleep-wake schedule and try to exercise daily, particularly in the form of walking, 20-30 minutes a day, if you can.   As far as your medications are concerned, I would like to suggest Aricept 5mg  for one week then increase to 10mg .  Can also start Celexa 10mg  daily  As far as diagnostic testing: MRi of the brain, B12 lab  I would like to see you back in 4 months, sooner if we need to. Please call us with any interim questions, concerns, problems, updates or refill requests.   Our phone number is [phone]. We also have an after hours call service for urgent matters and there is a physician on-call for urgent questions. For any emergencies you know to call 911 or go to the nearest emergency room

## 2015-12-13 ENCOUNTER — Encounter: Payer: Self-pay | Admitting: *Deleted

## 2015-12-13 ENCOUNTER — Telehealth: Payer: Self-pay | Admitting: *Deleted

## 2015-12-13 LAB — B12 AND FOLATE PANEL
Folate: 8.2 ng/mL (ref >3.0)
VITAMIN B 12: 545 pg/mL (ref 211–946)

## 2015-12-13 NOTE — Progress Notes (Signed)
Faxed completed office note by Dr Lucia GaskinsAhern to [facility], where pt resides. Fax: [phone]. Received confirmation.

## 2015-12-13 NOTE — Telephone Encounter (Signed)
Per Dr Lucia GaskinsAhern, spoke with daughter and informed her patient's lab results are normal. She verbalized understanding.

## 2015-12-21 ENCOUNTER — Ambulatory Visit
Admission: RE | Admit: 2015-12-21 | Discharge: 2015-12-21 | Disposition: A | Discharge status: Home / Self Care | Payer: Medicare Other | Source: Ambulatory Visit | Attending: Neurology | Admitting: Neurology

## 2015-12-21 DIAGNOSIS — R413 Other amnesia: Secondary | ICD-10-CM

## 2015-12-21 DIAGNOSIS — F05 Delirium due to known physiological condition: Secondary | ICD-10-CM

## 2015-12-21 DIAGNOSIS — R410 Disorientation, unspecified: Secondary | ICD-10-CM

## 2015-12-25 ENCOUNTER — Telehealth: Payer: Self-pay | Admitting: *Deleted

## 2015-12-25 ENCOUNTER — Other Ambulatory Visit: Payer: Self-pay | Admitting: Neurology

## 2015-12-25 MED ORDER — CITALOPRAM HYDROBROMIDE 10 MG PO TABS: 10.0000 mg | ORAL_TABLET | Freq: Every day | ORAL | Status: AC

## 2015-12-25 MED ORDER — DONEPEZIL HCL 10 MG PO TABS: 10.0000 mg | ORAL_TABLET | Freq: Every day | ORAL | Status: AC

## 2015-12-25 NOTE — Telephone Encounter (Signed)
-----   Message from Anson FretAntonia B Ahern, MD sent at 12/24/2015  2:04 PM EDT ----- MRI of the brain did not show any masses or strokes. However there are extensive white matter changes and  mesial temporal atrophy. The white matter changes are due to atherosclerotic vascular disease most likely due to her diabetes, HTN and cholesterol. This extent of white matter changes likely contribute to her dementia. The atrophy of the brain in these areas are also seen in dementia.  These changes were  also seen on previous CT of the head. Please call daughter to discuss   [phone]. I am happy to review this with them at next appointment and show them the images or sooner if they like thanks

## 2015-12-25 NOTE — Telephone Encounter (Signed)
Faxed office note and rx aricept/celexa to facility. Fax: [phone]. Received confirmation.

## 2015-12-25 NOTE — Telephone Encounter (Signed)
Dr Lucia GaskinsAhern- please advise. Daughter does not think pt has started medications yet. She spoke to place where pt resides and they stated they have not received prescriptions yet.    Called and spoke to daughter, Rachel AngCathy about MRI brain results per Dr Lucia GaskinsAhern note. She verbalized understanding and declines an appt at this time to review images in office with Dr Lucia GaskinsAhern. Knows to call back if she changes her mind. She states [facility] [place] has not received rx for aricept or celexa. Advised I will speak to Dr Lucia GaskinsAhern and call her back to update her. She verbalized understanding.

## 2015-12-25 NOTE — Telephone Encounter (Signed)
Called daughter and updated her. She verbalized understanding and appreciation.

## 2015-12-25 NOTE — Telephone Encounter (Signed)
Kara MeadEmma, I printed out my note and prescriptions. Would you fax to them please? Thank you

## 2016-09-19 ENCOUNTER — Other Ambulatory Visit (HOSPITAL_COMMUNITY): Payer: Self-pay | Admitting: Cardiology

## 2016-09-19 ENCOUNTER — Other Ambulatory Visit: Payer: Self-pay | Admitting: Cardiology

## 2016-09-19 DIAGNOSIS — I272 Pulmonary hypertension, unspecified: Secondary | ICD-10-CM

## 2016-09-20 ENCOUNTER — Other Ambulatory Visit: Payer: Medicare Other

## 2016-09-24 ENCOUNTER — Other Ambulatory Visit: Payer: Medicare Other

## 2016-09-26 ENCOUNTER — Ambulatory Visit
Admission: RE | Admit: 2016-09-26 | Discharge: 2016-09-26 | Disposition: A | Discharge status: Home / Self Care | Payer: Medicare Other | Source: Ambulatory Visit | Attending: Cardiology | Admitting: Cardiology

## 2016-09-26 DIAGNOSIS — I272 Pulmonary hypertension, unspecified: Secondary | ICD-10-CM

## 2016-09-26 MED ORDER — IOPAMIDOL (ISOVUE-370) INJECTION 76%
100.0000 mL | Freq: Once | INTRAVENOUS | Status: DC | PRN
Start: 2016-09-26 — End: 2016-09-27

## 2017-04-21 ENCOUNTER — Institutional Professional Consult (permissible substitution): Payer: Medicare Other | Admitting: Internal Medicine

## 2017-05-08 ENCOUNTER — Encounter: Payer: Self-pay | Admitting: Internal Medicine

## 2017-05-08 ENCOUNTER — Ambulatory Visit (INDEPENDENT_AMBULATORY_CARE_PROVIDER_SITE_OTHER): Payer: Medicare Other | Admitting: Internal Medicine

## 2017-05-08 ENCOUNTER — Other Ambulatory Visit (INDEPENDENT_AMBULATORY_CARE_PROVIDER_SITE_OTHER): Payer: Medicare Other

## 2017-05-08 VITALS — BP 128/80 | HR 63 | Ht 62.0 in | Wt 211.0 lb

## 2017-05-08 DIAGNOSIS — R0609 Other forms of dyspnea: Secondary | ICD-10-CM

## 2017-05-08 DIAGNOSIS — I10 Essential (primary) hypertension: Secondary | ICD-10-CM

## 2017-05-08 LAB — CBC WITH DIFFERENTIAL/PLATELET
BASOS ABS: 0 10*3/uL (ref 0.0–0.1)
BASOS PCT: 0.7 % (ref 0.0–3.0)
EOS ABS: 0.2 10*3/uL (ref 0.0–0.7)
Eosinophils Relative: 2.8 % (ref 0.0–5.0)
HCT: 41.7 % (ref 36.0–46.0)
Hemoglobin: 13.5 g/dL (ref 12.0–15.0)
LYMPHS ABS: 1.8 10*3/uL (ref 0.7–4.0)
Lymphocytes Relative: 30.1 % (ref 12.0–46.0)
MCHC: 32.3 g/dL (ref 30.0–36.0)
MCV: 92.4 fl (ref 78.0–100.0)
Monocytes Absolute: 0.5 10*3/uL (ref 0.1–1.0)
Monocytes Relative: 8.8 % (ref 3.0–12.0)
NEUTROS ABS: 3.5 10*3/uL (ref 1.4–7.7)
NEUTROS PCT: 57.6 % (ref 43.0–77.0)
PLATELETS: 255 10*3/uL (ref 150.0–400.0)
RBC: 4.51 Mil/uL (ref 3.87–5.11)
RDW: 14.3 % (ref 11.5–15.5)
WBC: 6.1 10*3/uL (ref 4.0–10.5)

## 2017-05-08 LAB — BASIC METABOLIC PANEL
BUN: 15 mg/dL (ref 6–23)
CHLORIDE: 102 meq/L (ref 96–112)
CO2: 29 mEq/L (ref 19–32)
Calcium: 9.9 mg/dL (ref 8.4–10.5)
Creatinine, Ser: 0.89 mg/dL (ref 0.40–1.20)
GFR: 64.73 mL/min (ref >60.00)
Glucose, Bld: 99 mg/dL (ref 70–99)
POTASSIUM: 4.2 meq/L (ref 3.5–5.1)
SODIUM: 136 meq/L (ref 135–145)

## 2017-05-08 LAB — BRAIN NATRIURETIC PEPTIDE: PRO B NATRI PEPTIDE: 254 pg/mL — AB (ref 0.0–100.0)

## 2017-05-08 LAB — TSH: TSH: 2.09 u[IU]/mL (ref 0.35–4.50)

## 2017-05-08 MED ORDER — IRBESARTAN 150 MG PO TABS: 150.0000 mg | ORAL_TABLET | Freq: Every day | ORAL | Status: DC

## 2017-05-08 NOTE — Progress Notes (Signed)
Subjective:     Patient ID: Rachel Rangel, female   DOB: 01-17-1934, 83 y.o.   MRN: [id number]  HPI  45 yowf NH resident ? Smoking status ?  [facility]:   Admit date: 09/07/2015 Discharge date: 09/11/2015    Discharge Diagnoses:  Principal Problem:   Acute respiratory failure with hypoxia [facility]) Active Problems:   UTI (lower urinary tract infection)   Community acquired pneumonia   Back pain   Fall   Diabetes mellitus type 2, controlled, without complications ([place])   Leukocytosis   Sepsis ([place])   T12 compression fracture ([place])   Acute respiratory failure with hypoxia Came in with oxygen saturation of 86% and labored breathing. This is likely secondary to sepsis syndrome and community acquired pneumonia. Started on broad-spectrum antibiotics for pneumonia.  Sepsis Met sepsis criteria with respiratory rate of 28, WBC of 14.4 and presence of infection (pneumonia/UTI). Patient is hemodynamically stable. Continue IV antibiotics, treated with Rocephin and azithromycin in the hospital. On discharge levofloxacin for 5 more days.  Community acquired pneumonia Has hypoxia, shortness of breath and labored breathing, started on Rocephin and azithromycin. Continue supportive management with bronchodilators, mucolytics, antitussives and oxygen as needed. Levofloxacin for 5 more days on discharge.  Escherichia coli UTI Urinalysis consistent with UTI, patient is started Rocephin. Pansensitive Escherichia coli, on Levaquin.  T12 compression fracture Severe back pain, this is happened after the fall. CT scan showed subacute to acute T12 compression fracture with 40% loss of height. Discussed with patient and daughter about conservative management versus kyphoplasty/vertebroplasty. Patient and her daughter chose conservative management with early ambulation and pain control.  Diabetes mellitus type 2 Carbohydrate modified diet, hemoglobin A1c is 7.6  correlate with many plasma glucose of 171. SSI and oral hypoglycemic were held during hospital stay, restarted on discharge.  Leukocytosis Secondary to CAP/UTI and sepsis syndrome.    05/08/2017 1st [facility] office visit/ Amman Bartel   Chief Complaint  Patient presents with  . Pulmonary Consult    Referred by Jacklyn Shell, NP. Pt c/o SOB for the past several wks. She states that it is hard for her to say when she feels SOB.    very little reliable hx from pt,  She's not sure why she is here and can't identify any resp symptoms so hx obtained entirely from [facility] records   Denies cough or sob at present and says she sleeps fine    Review of Systems     Objective:   Physical Exam   Elderly w/c bound wf with freq throat clearing   Wt Readings from Last 3 Encounters:  05/08/17 211 lb (95.7 kg)  12/12/15 164 lb (74.4 kg)  09/07/15 180 lb (81.6 kg)    Vital signs reviewed - Note on arrival 02 sats  95% on RA      HEENT: nl dentition, turbinates bilaterally, and oropharynx. Nl external ear canals without cough reflex   NECK :  without JVD/Nodes/TM/ nl carotid upstrokes bilaterally   LUNGS: no acc muscle use,  Nl contour chest which is clear to A and P bilaterally without cough on insp or exp maneuvers   CV:  RRR  no s3 or murmur or increase in P2, and elastic wraps both legs/ 1+ ptting   ABD:  soft and nontender with nl inspiratory excursion in the supine position. No bruits or organomegaly appreciated, bowel sounds nl  MS:   ext warm without deformities, calf tenderness, cyanosis or clubbing No obvious joint  restrictions   SKIN: warm and dry without lesions    NEURO:  alert,   with  no motor or cerebellar deficits apparent -  Oriented to person only        I personally reviewed images and agree with radiology impression as follows:   Chest CTa 09/26/16 done due to swelling in legs/ sob and cough 1. No evidence pulmonary embolus. 2. Pulmonary arteries are mildly  enlarged. 3. Atherosclerosis. 4. Views ground-glass attenuation likely reflects atelectasis or less likely edema.   Labs ordered/ reviewed:     Chemistry      Component Value Date/Time   NA 136 05/08/2017 1243   NA 141 09/12/2015   K 4.2 05/08/2017 1243   CL 102 05/08/2017 1243   CO2 29 05/08/2017 1243   BUN 15 05/08/2017 1243   BUN 17 09/12/2015   CREATININE 0.89 05/08/2017 1243   GLU 134 09/12/2015      Component Value Date/Time   CALCIUM 9.9 05/08/2017 1243   ALKPHOS 117 09/12/2015   AST 11 (A) 09/12/2015   ALT 13 09/12/2015   BILITOT 2.0 (H) 09/07/2015 1532        Lab Results  Component Value Date   WBC 6.1 05/08/2017   HGB 13.5 05/08/2017   HCT 41.7 05/08/2017   MCV 92.4 05/08/2017   PLT 255.0 05/08/2017         Lab Results  Component Value Date   TSH 2.09 05/08/2017     Lab Results  Component Value Date   PROBNP 254.0 (H) 05/08/2017             Assessment:     Outpatient Encounter Prescriptions as of 05/08/2017  Medication Sig  . Acetaminophen (APAP 500 PO) Take 1 tablet by mouth every 6 (six) hours as needed.  Marland Kitchen amLODipine (NORVASC) 10 MG tablet Take 10 mg by mouth daily.  . Cholecalciferol (VITAMIN D3) 2000 UNITS capsule Take 4,000 Units by mouth daily.  . citalopram (CELEXA) 10 MG tablet Take 1 tablet (10 mg total) by mouth daily.  Marland Kitchen donepezil (ARICEPT) 10 MG tablet Take 1 tablet (10 mg total) by mouth at bedtime. Start with 1/2 tablet for 2 weeks then increase to a whole tablet  . furosemide (LASIX) 20 MG tablet Take 10 mg by mouth daily.  Marland Kitchen ibuprofen (ADVIL,MOTRIN) 200 MG tablet Take 200 mg by mouth every 6 (six) hours as needed.  . loratadine (CLARITIN) 10 MG tablet Take 10 mg by mouth daily as needed for allergies.  . mineral oil liquid Take 15 mLs by mouth daily as needed for moderate constipation.  Marland Kitchen nystatin cream (MYCOSTATIN) Apply 1 application topically 2 (two) times daily. To groin and buttocks  . Pioglitazone HCl-Metformin HCl  (ACTOPLUS MET XR) 30-1000 MG TB24 Take 1 tablet by mouth daily.  . sodium chloride (OCEAN) 0.65 % nasal spray Place 1 spray into the nose at bedtime as needed for congestion.  . [DISCONTINUED] lisinopril (PRINIVIL,ZESTRIL) 20 MG tablet Take 20 mg by mouth daily.  . [DISCONTINUED] atorvastatin (LIPITOR) 20 MG tablet Take 20 mg by mouth daily at 6 PM.   No facility-administered encounter medications on file as of 05/08/2017.

## 2017-05-08 NOTE — Patient Instructions (Addendum)
Please remember to go to the lab department downstairs in the basement  for your tests - we will call you with the results when they are available.     Stop lisinopril and start avapro 150 mg daily in its place   Please schedule a follow up office visit in 6 weeks, call sooner if needed with pfts and cxr

## 2017-05-09 ENCOUNTER — Encounter: Payer: Self-pay | Admitting: Internal Medicine

## 2017-05-09 DIAGNOSIS — I10 Essential (primary) hypertension: Secondary | ICD-10-CM | POA: Insufficient documentation

## 2017-05-09 NOTE — Assessment & Plan Note (Signed)
Trial off acei rec 05/08/2017 due to unexplained sob assoc with throat clearing   In the best review of chronic cough to date ( NEJM 2016 375 [id number]) ,  ACEi are now felt to cause cough in up to  20% of pts which is a 4 fold increase from previous reports and does not include the variety of non-specific complaints we see in pulmonary clinic in pts on ACEi but previously attributed to another dx like  Copd/asthma and  include PNDS, throat and chest congestion, "bronchitis", unexplained dyspnea and noct "strangling" sensations, and hoarseness/ throat clearing , but also  atypical /refractory GERD symptoms like dysphagia and "bad heartburn"   The only way I know  to prove this is not an "ACEi Case" is a trial off ACEi x a minimum of 6 weeks then regroup.

## 2017-05-09 NOTE — Assessment & Plan Note (Addendum)
Not able to complete any kind of w/u today given level of demential apparent - the referral note is suggestive of PH but if this is the case the w/u should start with ono on RA and supplment (if she'll wear it ) with 02 to keep sats > 90% consistently x 3 months then do Echocardiogram if there is a concern re symptoms while off ACEi (see separate a/p)   Will bring back in 3 months for pfts/ cxr to complete the w/u - see sooner prn but there is a limit to what specialty care can do in the setting of advance dementia and I would have a very low threshold to change over to a palliative approach here

## 2017-06-03 ENCOUNTER — Telehealth: Payer: Self-pay | Admitting: Internal Medicine

## 2017-06-03 NOTE — Telephone Encounter (Signed)
[facility] requesting last OV note to be faxed to them.  This has been sent.  Nothing further needed.

## 2017-06-20 ENCOUNTER — Ambulatory Visit: Payer: Medicare Other | Admitting: Internal Medicine

## 2017-07-08 ENCOUNTER — Encounter: Payer: Self-pay | Admitting: Internal Medicine

## 2017-07-08 ENCOUNTER — Ambulatory Visit (INDEPENDENT_AMBULATORY_CARE_PROVIDER_SITE_OTHER): Payer: Medicare Other | Admitting: Internal Medicine

## 2017-07-08 ENCOUNTER — Ambulatory Visit (INDEPENDENT_AMBULATORY_CARE_PROVIDER_SITE_OTHER)
Admission: RE | Admit: 2017-07-08 | Discharge: 2017-07-08 | Disposition: A | Discharge status: Home / Self Care | Payer: Medicare Other | Source: Ambulatory Visit | Attending: Internal Medicine | Admitting: Internal Medicine

## 2017-07-08 VITALS — BP 160/80 | HR 58

## 2017-07-08 DIAGNOSIS — R0609 Other forms of dyspnea: Secondary | ICD-10-CM | POA: Diagnosis not present

## 2017-07-08 DIAGNOSIS — I10 Essential (primary) hypertension: Secondary | ICD-10-CM | POA: Diagnosis not present

## 2017-07-08 NOTE — Patient Instructions (Signed)
Increase Avapro to 300 mg daily and continue lasix but monitor bmet/ bp per PCP  Please remember to go to the  x-ray department downstairs in the basement  for your tests - we will call you with the results when they are available.      Pulmonary f/u is as needed

## 2017-07-08 NOTE — Progress Notes (Signed)
Subjective:     Patient ID: Rachel Rangel, female   DOB: 26-Nov-1933, 83 y.o.   MRN: [id number]  HPI  35 yowf NH resident  Quit smoking 2016  [facility]:   Admit date: 09/07/2015 Discharge date: 09/11/2015    Discharge Diagnoses:  Principal Problem:   Acute respiratory failure with hypoxia [facility]) Active Problems:   UTI (lower urinary tract infection)   Community acquired pneumonia   Back pain   Fall   Diabetes mellitus type 2, controlled, without complications (HCC)   Leukocytosis   Sepsis (HCC)   T12 compression fracture (HCC)   Acute respiratory failure with hypoxia Came in with oxygen saturation of 86% and labored breathing. This is likely secondary to sepsis syndrome and community acquired pneumonia. Started on broad-spectrum antibiotics for pneumonia.  Sepsis Met sepsis criteria with respiratory rate of 28, WBC of 14.4 and presence of infection (pneumonia/UTI). Patient is hemodynamically stable. Continue IV antibiotics, treated with Rocephin and azithromycin in the hospital. On discharge levofloxacin for 5 more days.  Community acquired pneumonia Has hypoxia, shortness of breath and labored breathing, started on Rocephin and azithromycin. Continue supportive management with bronchodilators, mucolytics, antitussives and oxygen as needed. Levofloxacin for 5 more days on discharge.  Escherichia coli UTI Urinalysis consistent with UTI, patient is started Rocephin. Pansensitive Escherichia coli, on Levaquin.  T12 compression fracture Severe back pain, this is happened after the fall. CT scan showed subacute to acute T12 compression fracture with 40% loss of height. Discussed with patient and daughter about conservative management versus kyphoplasty/vertebroplasty. Patient and her daughter chose conservative management with early ambulation and pain control.  Diabetes mellitus type 2 Carbohydrate modified diet, hemoglobin A1c is 7.6  correlate with many plasma glucose of 171. SSI and oral hypoglycemic were held during hospital stay, restarted on discharge.  Leukocytosis Secondary to CAP/UTI and sepsis syndrome.    05/08/2017 1st [facility] office visit/ [facility]   Chief Complaint  Patient presents with  . Pulmonary Consult    Referred by Manus Gunning, NP. Pt c/o SOB for the past several wks. She states that it is hard for her to say when she feels SOB.    very little reliable hx from pt,  She's not sure why she is here and can't identify any resp symptoms so hx obtained entirely from [facility]  Denies cough or sob at present and says she sleeps fine  rec Please remember to go to the lab department downstairs in the basement  for your tests - we will call you with the results when they are available. Stop lisinopril and start avapro 150 mg daily in its place  Please schedule a follow up office visit in 6 weeks, call sooner if needed with pfts and cxr     07/08/2017  f/u ov/[facility] re:  Sob/ throat clearing better off ACEi  Chief Complaint  Patient presents with  . Follow-up    Pt states her breathing has improved some. No new co's today.    no orthopnea/ no change chronic R >  L leg swelling    No obvious day to day or daytime variability or assoc excess/ purulent sputum or mucus plugs or hemoptysis or cp or chest tightness, subjective wheeze or overt sinus or hb symptoms. No unusual exp hx or h/o childhood pna/ asthma or knowledge of premature birth.  Sleeping ok flat without nocturnal  or early am exacerbation  of respiratory  c/o's or need for noct saba. Also  denies any obvious fluctuation of symptoms with weather or environmental changes or other aggravating or alleviating factors except as outlined above   Current Allergies, Complete Past Medical History, Past Surgical History, Family History, and Social History were reviewed in [employer] record.  ROS  The following are not  active complaints unless bolded Hoarseness, sore throat, dysphagia, dental problems, itching, sneezing,  nasal congestion or discharge of excess mucus or purulent secretions, ear ache,   fever, chills, sweats, unintended wt loss or wt gain, classically pleuritic or exertional cp,  orthopnea pnd or leg swelling, presyncope, palpitations, abdominal pain, anorexia, nausea, vomiting, diarrhea  or change in bowel habits or change in bladder habits, change in stools or change in urine, dysuria, hematuria,  rash, arthralgias, visual complaints, headache, numbness, weakness or ataxia or problems with walking or coordination,  change in mood/affect or memory.        Med list unchanged from last avs/ reviewed MAR and it is correct         Objective:   Physical Exam   Elderly w/c bound wf  Could not stand up to weigh due to balance    Wt Readings from Last 3 Encounters:  05/08/17 211 lb (95.7 kg)  12/12/15 164 lb (74.4 kg)  09/07/15 180 lb (81.6 kg)    Vital signs reviewed - Note on arrival 02 sats  97% on RA  With BP  160/80    HEENT: nl   turbinates bilaterally, and oropharynx. Nl external ear canals without cough reflex - edentulous / dentures in place   NECK :  without JVD/Nodes/TM/ nl carotid upstrokes bilaterally   LUNGS: no acc muscle use,  Nl contour chest which is clear to A and P bilaterally without cough on insp or exp maneuvers   CV:  RRR  no s3 or murmur or increase in P2, and   both legs/ 1+ ptting  R> L   ABD:  soft and nontender with nl inspiratory excursion in the supine position. No bruits or organomegaly appreciated, bowel sounds nl  MS:   ext warm without deformities, calf tenderness, cyanosis or clubbing No obvious joint restrictions   SKIN: warm and dry without lesions    NEURO:  alert,   with  no motor or cerebellar deficits apparent -  Oriented to person / knows Tuesday and a storm coming (but not hurricane Michael)      CXR PA and Lateral:   07/08/2017 :     I personally reviewed images and agree with radiology impression as follows:    Findings are worrisome for CHF with mild interstitial edema. This may be both acute and chronic. There is no discrete pneumonia.     Labs  reviewed:     Chemistry      Component Value Date/Time   NA 136 05/08/2017 1243   NA 141 09/12/2015   K 4.2 05/08/2017 1243   CL 102 05/08/2017 1243   CO2 29 05/08/2017 1243   BUN 15 05/08/2017 1243   BUN 17 09/12/2015   CREATININE 0.89 05/08/2017 1243   GLU 134 09/12/2015      Component Value Date/Time   CALCIUM 9.9 05/08/2017 1243   ALKPHOS 117 09/12/2015   AST 11 (A) 09/12/2015   ALT 13 09/12/2015   BILITOT 2.0 (H) 09/07/2015 1532        Lab Results  Component Value Date   WBC 6.1 05/08/2017   HGB 13.5 05/08/2017   HCT 41.7 05/08/2017  MCV 92.4 05/08/2017   PLT 255.0 05/08/2017         Lab Results  Component Value Date   TSH 2.09 05/08/2017     Lab Results  Component Value Date   PROBNP 254.0 (H) 05/08/2017             Assessment:

## 2017-07-09 NOTE — Assessment & Plan Note (Signed)
Breathing/ cough improved though appears to have element of chf on cxr and by bnp > needs better bp control and continued diuresis per PCP with monitor of serial bp/ bmet   I had an extended discussion with the patient reviewing all relevant studies completed to date and  lasting 15 to 20 minutes of a 25 minute visit    Each maintenance medication was reviewed in detail including most importantly the difference between maintenance and prns and under what circumstances the prns are to be triggered using an action plan format that is not reflected in the computer generated alphabetically organized AVS.    Please see AVS for specific instructions unique to this visit that I personally wrote and verbalized to the the pt in detail and then reviewed with pt  by my nurse highlighting any  changes in therapy recommended at today's visit to their plan of care.

## 2017-07-09 NOTE — Assessment & Plan Note (Signed)
Trial off acei rec 05/08/2017 due to unexplained sob assoc with throat clearing > improved 07/08/2017   Symptoms have improved despite bp still up and cxr c/w element of chf  Although even in retrospect it may not be clear the ACEi contributed to the pt's symptoms,  Pt improved off them and adding them back at this point or in the future would risk confusion in interpretation of non-specific respiratory symptoms to which this patient is prone  ie  Better not to muddy the waters here.   rec double the avapro to 300 mg daily and continue diuresis as tol per pcp  Pulmonary f/u is prn increased sob in absence of obvious vol overload

## 2017-08-24 ENCOUNTER — Other Ambulatory Visit: Payer: Self-pay

## 2017-08-24 ENCOUNTER — Emergency Department (HOSPITAL_COMMUNITY): Payer: Medicare Other

## 2017-08-24 ENCOUNTER — Encounter (HOSPITAL_COMMUNITY): Payer: Self-pay | Admitting: Emergency Medicine

## 2017-08-24 ENCOUNTER — Inpatient Hospital Stay (HOSPITAL_COMMUNITY)
Admission: EM | Admit: 2017-08-24 | Discharge: 2017-08-30 | DRG: 190 | Disposition: A | Discharge status: Home Health | Payer: Medicare Other | Attending: Internal Medicine | Admitting: Internal Medicine

## 2017-08-24 DIAGNOSIS — J9601 Acute respiratory failure with hypoxia: Secondary | ICD-10-CM | POA: Diagnosis present

## 2017-08-24 DIAGNOSIS — E785 Hyperlipidemia, unspecified: Secondary | ICD-10-CM | POA: Diagnosis present

## 2017-08-24 DIAGNOSIS — E559 Vitamin D deficiency, unspecified: Secondary | ICD-10-CM | POA: Diagnosis present

## 2017-08-24 DIAGNOSIS — I10 Essential (primary) hypertension: Secondary | ICD-10-CM | POA: Diagnosis not present

## 2017-08-24 DIAGNOSIS — N390 Urinary tract infection, site not specified: Secondary | ICD-10-CM | POA: Diagnosis not present

## 2017-08-24 DIAGNOSIS — Z90711 Acquired absence of uterus with remaining cervical stump: Secondary | ICD-10-CM

## 2017-08-24 DIAGNOSIS — R0902 Hypoxemia: Secondary | ICD-10-CM | POA: Diagnosis present

## 2017-08-24 DIAGNOSIS — Z888 Allergy status to other drugs, medicaments and biological substances status: Secondary | ICD-10-CM | POA: Diagnosis not present

## 2017-08-24 DIAGNOSIS — Z83511 Family history of glaucoma: Secondary | ICD-10-CM | POA: Diagnosis not present

## 2017-08-24 DIAGNOSIS — B9620 Unspecified Escherichia coli [E. coli] as the cause of diseases classified elsewhere: Secondary | ICD-10-CM | POA: Diagnosis present

## 2017-08-24 DIAGNOSIS — Z880 Allergy status to penicillin: Secondary | ICD-10-CM | POA: Diagnosis not present

## 2017-08-24 DIAGNOSIS — E119 Type 2 diabetes mellitus without complications: Secondary | ICD-10-CM | POA: Diagnosis present

## 2017-08-24 DIAGNOSIS — Z87891 Personal history of nicotine dependence: Secondary | ICD-10-CM

## 2017-08-24 DIAGNOSIS — Y92230 Patient room in hospital as the place of occurrence of the external cause: Secondary | ICD-10-CM | POA: Diagnosis not present

## 2017-08-24 DIAGNOSIS — E875 Hyperkalemia: Secondary | ICD-10-CM | POA: Diagnosis present

## 2017-08-24 DIAGNOSIS — J9600 Acute respiratory failure, unspecified whether with hypoxia or hypercapnia: Secondary | ICD-10-CM | POA: Diagnosis not present

## 2017-08-24 DIAGNOSIS — N3281 Overactive bladder: Secondary | ICD-10-CM | POA: Diagnosis present

## 2017-08-24 DIAGNOSIS — T380X5A Adverse effect of glucocorticoids and synthetic analogues, initial encounter: Secondary | ICD-10-CM | POA: Diagnosis not present

## 2017-08-24 DIAGNOSIS — Z8249 Family history of ischemic heart disease and other diseases of the circulatory system: Secondary | ICD-10-CM

## 2017-08-24 DIAGNOSIS — N179 Acute kidney failure, unspecified: Secondary | ICD-10-CM | POA: Diagnosis present

## 2017-08-24 DIAGNOSIS — Z791 Long term (current) use of non-steroidal anti-inflammatories (NSAID): Secondary | ICD-10-CM | POA: Diagnosis not present

## 2017-08-24 DIAGNOSIS — M810 Age-related osteoporosis without current pathological fracture: Secondary | ICD-10-CM | POA: Diagnosis present

## 2017-08-24 DIAGNOSIS — J441 Chronic obstructive pulmonary disease with (acute) exacerbation: Principal | ICD-10-CM | POA: Diagnosis present

## 2017-08-24 DIAGNOSIS — I110 Hypertensive heart disease with heart failure: Secondary | ICD-10-CM | POA: Diagnosis present

## 2017-08-24 DIAGNOSIS — N189 Chronic kidney disease, unspecified: Secondary | ICD-10-CM

## 2017-08-24 DIAGNOSIS — I5033 Acute on chronic diastolic (congestive) heart failure: Secondary | ICD-10-CM | POA: Diagnosis not present

## 2017-08-24 DIAGNOSIS — E860 Dehydration: Secondary | ICD-10-CM | POA: Diagnosis present

## 2017-08-24 DIAGNOSIS — Z7984 Long term (current) use of oral hypoglycemic drugs: Secondary | ICD-10-CM | POA: Diagnosis not present

## 2017-08-24 DIAGNOSIS — Z800 Family history of malignant neoplasm of digestive organs: Secondary | ICD-10-CM | POA: Diagnosis not present

## 2017-08-24 DIAGNOSIS — F0390 Unspecified dementia without behavioral disturbance: Secondary | ICD-10-CM | POA: Diagnosis present

## 2017-08-24 DIAGNOSIS — Z66 Do not resuscitate: Secondary | ICD-10-CM | POA: Diagnosis present

## 2017-08-24 DIAGNOSIS — Z79899 Other long term (current) drug therapy: Secondary | ICD-10-CM

## 2017-08-24 DIAGNOSIS — I361 Nonrheumatic tricuspid (valve) insufficiency: Secondary | ICD-10-CM | POA: Diagnosis not present

## 2017-08-24 HISTORY — DX: Unspecified dementia, unspecified severity, without behavioral disturbance, psychotic disturbance, mood disturbance, and anxiety: F03.90

## 2017-08-24 LAB — CBC WITH DIFFERENTIAL/PLATELET
Basophils Absolute: 0 10*3/uL (ref 0.0–0.1)
Basophils Relative: 0 %
EOS ABS: 0.4 10*3/uL (ref 0.0–0.7)
Eosinophils Relative: 4 %
HEMATOCRIT: 37.9 % (ref 36.0–46.0)
HEMOGLOBIN: 12.5 g/dL (ref 12.0–15.0)
Lymphocytes Relative: 39 %
Lymphs Abs: 4 10*3/uL (ref 0.7–4.0)
MCH: 30.1 pg (ref 26.0–34.0)
MCHC: 33 g/dL (ref 30.0–36.0)
MCV: 91.3 fL (ref 78.0–100.0)
MONOS PCT: 6 %
Monocytes Absolute: 0.6 10*3/uL (ref 0.1–1.0)
NEUTROS PCT: 51 %
Neutro Abs: 5.2 10*3/uL (ref 1.7–7.7)
Platelets: 270 10*3/uL (ref 150–400)
RBC: 4.15 MIL/uL (ref 3.87–5.11)
RDW: 14 % (ref 11.5–15.5)
WBC: 10.2 10*3/uL (ref 4.0–10.5)

## 2017-08-24 LAB — BASIC METABOLIC PANEL
Anion gap: 11 (ref 5–15)
BUN: 25 mg/dL — AB (ref 6–20)
CO2: 24 mmol/L (ref 22–32)
CREATININE: 1.1 mg/dL — AB (ref 0.44–1.00)
Calcium: 9.6 mg/dL (ref 8.9–10.3)
Chloride: 101 mmol/L (ref 101–111)
GFR calc Af Amer: 53 mL/min — ABNORMAL LOW (ref >60)
GFR, EST NON AFRICAN AMERICAN: 46 mL/min — AB (ref >60)
GLUCOSE: 182 mg/dL — AB (ref 65–99)
POTASSIUM: 3.6 mmol/L (ref 3.5–5.1)
SODIUM: 136 mmol/L (ref 135–145)

## 2017-08-24 LAB — I-STAT TROPONIN, ED: TROPONIN I, POC: 0 ng/mL (ref 0.00–0.08)

## 2017-08-24 MED ORDER — ALBUTEROL SULFATE (2.5 MG/3ML) 0.083% IN NEBU
5.0000 mg | INHALATION_SOLUTION | Freq: Once | RESPIRATORY_TRACT | Status: AC
  Administered 2017-08-24: 5 mg via RESPIRATORY_TRACT
  Filled 2017-08-24: qty 6

## 2017-08-24 MED ORDER — IPRATROPIUM-ALBUTEROL 0.5-2.5 (3) MG/3ML IN SOLN
3.0000 mL | Freq: Once | RESPIRATORY_TRACT | Status: AC
  Administered 2017-08-24: 3 mL via RESPIRATORY_TRACT
  Filled 2017-08-24: qty 3

## 2017-08-24 MED ORDER — ONDANSETRON HCL 4 MG/2ML IJ SOLN
4.0000 mg | Freq: Once | INTRAMUSCULAR | Status: AC
  Administered 2017-08-24: 4 mg via INTRAVENOUS
  Filled 2017-08-24: qty 2

## 2017-08-24 NOTE — ED Notes (Signed)
Bed: WA01 Expected date:  Expected time:  Means of arrival:  Comments: 83 yo F/ Shortness of breath

## 2017-08-24 NOTE — ED Provider Notes (Signed)
[facility] DEPT Provider Note   CSN: [id number] Arrival date & time: 08/24/17  2150     History   Chief Complaint Chief Complaint  Patient presents with  . Shortness of Breath    HPI Rachel Rangel is a 83 y.o. female.  Pt presents to the ED today with SOB.  Pt said sx started yesterday.  The pt also has some nausea.  The pt was given 10 mg of albuterol and 125 mg of solumedrol iv en route by EMS.  The pt's sob has improved, but is still there.      Past Medical History:  Diagnosis Date  . Benign hypertension   . Diabetes mellitus without complication ([place])   . Edema    Left pedal   . History of complete eye exam 12/25/2012   Normal eye exam, no retinopathy [facility]  . Hyperlipidemia   . Osteoporosis 2010  . Overactive bladder   . Vitamin D deficiency   . Vitamin D deficiency     Patient Active Problem List   Diagnosis Date Noted  . Essential hypertension 05/09/2017  . Dyspnea on exertion 05/08/2017  . Dementia without behavioral disturbance 12/12/2015  . T12 compression fracture ([place]) 09/08/2015  . UTI (lower urinary tract infection) 09/07/2015  . Community acquired pneumonia 09/07/2015  . Back pain 09/07/2015  . Fall 09/07/2015  . Diabetes mellitus type 2, controlled, without complications ([place]) 71/21/9758  . Leukocytosis 09/07/2015  . Acute respiratory failure with hypoxia ([place]) 09/07/2015  . Sepsis ([place]) 09/07/2015    Past Surgical History:  Procedure Laterality Date  . ABDOMINAL HYSTERECTOMY  1975   partial    OB History    No data available       Home Medications    Prior to Admission medications   Medication Sig Start Date End Date Taking? Authorizing Provider  Acetaminophen (APAP 500 PO) Take 1 tablet by mouth every 6 (six) hours as needed.    [provider]  amLODipine (NORVASC) 10 MG tablet Take 10 mg by mouth daily. 11/14/15   [provider]  Cholecalciferol (VITAMIN D3)  2000 UNITS capsule Take 4,000 Units by mouth daily.    [provider]  citalopram (CELEXA) 10 MG tablet Take 1 tablet (10 mg total) by mouth daily. 12/25/15   Melvenia Beam, MD  donepezil (ARICEPT) 10 MG tablet Take 1 tablet (10 mg total) by mouth at bedtime. Start with 1/2 tablet for 2 weeks then increase to a whole tablet 12/25/15   Melvenia Beam, MD  furosemide (LASIX) 20 MG tablet Take 10 mg by mouth daily.    [provider]  ibuprofen (ADVIL,MOTRIN) 200 MG tablet Take 200 mg by mouth every 6 (six) hours as needed.    [provider]  irbesartan (AVAPRO) 150 MG tablet Take 1 tablet (150 mg total) by mouth daily. 05/08/17   Tanda Rockers, MD  loratadine (CLARITIN) 10 MG tablet Take 10 mg by mouth daily as needed for allergies.    [provider]  mineral oil liquid Take 15 mLs by mouth daily as needed for moderate constipation.    [provider]  nystatin cream (MYCOSTATIN) Apply 1 application topically 2 (two) times daily. To groin and buttocks 12/09/15   [provider]  Pioglitazone HCl-Metformin HCl (ACTOPLUS MET XR) 30-1000 MG TB24 Take 1 tablet by mouth daily.    [provider]  sodium chloride (OCEAN) 0.65 % nasal spray Place 1 spray  into the nose at bedtime as needed for congestion.    [provider]    Family History Family History  Problem Relation Age of Onset  . Pancreatic cancer Mother   . Glaucoma Mother   . Hypertension Father   . Hypertension Maternal Grandmother   . Glaucoma Maternal Grandfather   . Hypertension Paternal Grandmother   . Hypertension Paternal Grandfather   . Glaucoma Maternal Aunt   . Glaucoma Maternal Aunt   . Dementia Neg Hx     Social History Social History   Tobacco Use  . Smoking status: Former Smoker    Packs/day: 0.50    Years: 20.00    Pack years: 10.00    Types: Cigarettes    Last attempt to quit: 09/30/2014    Years since quitting: 2.9  . Smokeless  tobacco: Never Used  Substance Use Topics  . Alcohol use: Yes    Alcohol/week: 0.0 oz    Comment: ocass  . Drug use: No     Allergies   Lipitor [atorvastatin] and Penicillins   Review of Systems Review of Systems  Respiratory: Positive for shortness of breath.   Gastrointestinal: Positive for nausea.  All other systems reviewed and are negative.    Physical Exam Updated Vital Signs BP (!) 157/72 (BP Location: Right Arm)   Pulse 67   Temp 98.2 F (36.8 C) (Oral)   Resp 17   Ht 5' 2"  (1.575 m)   Wt 90.7 kg (200 lb)   SpO2 100%   BMI 36.58 kg/m   Physical Exam  Constitutional: She is oriented to person, place, and time. She appears well-developed and well-nourished. She appears ill.  HENT:  Head: Normocephalic and atraumatic.  Mouth/Throat: Oropharynx is clear and moist.  Eyes: EOM are normal. Pupils are equal, round, and reactive to light.  Neck: Normal range of motion. Neck supple.  Cardiovascular: Normal rate, regular rhythm, normal heart sounds and intact distal pulses.  Pulmonary/Chest: Tachypnea noted. She has wheezes.  Musculoskeletal: Normal range of motion.       Right lower leg: She exhibits edema.       Left lower leg: She exhibits edema.  Neurological: She is alert and oriented to person, place, and time.  Skin: Skin is warm and dry. Capillary refill takes less than 2 seconds.  Psychiatric: She has a normal mood and affect. Her behavior is normal.  Nursing note and vitals reviewed.    ED Treatments / Results  Labs (all labs ordered are listed, but only abnormal results are displayed) Labs Reviewed  BASIC METABOLIC PANEL - Abnormal; Notable for the following components:      Result Value   Glucose, Bld 182 (*)    BUN 25 (*)    Creatinine, Ser 1.10 (*)    GFR calc non Af Amer 46 (*)    GFR calc Af Amer 53 (*)    All other components within normal limits  CBC WITH DIFFERENTIAL/PLATELET  BRAIN NATRIURETIC PEPTIDE  I-STAT TROPONIN, ED    EKG   EKG Interpretation  Date/Time:  Sunday August 24 2017 22:04:36 EST Ventricular Rate:  73 PR Interval:    QRS Duration: 95 QT Interval:  403 QTC Calculation: 445 R Axis:   49 Text Interpretation:  Sinus rhythm Confirmed by Isla Pence [phone]) on 08/24/2017 11:02:37 PM       Radiology Dg Chest 2 View  Result Date: 08/24/2017 CLINICAL DATA:  Shortness of breath with nausea EXAM: CHEST  2 VIEW COMPARISON:  07/08/2017 FINDINGS: Mild cardiomegaly with aortic atherosclerosis. No pleural effusion. Coarse interstitial opacity likely chronic. Small focus of atelectasis or scar in the left mid lung. Negative for pneumothorax. Stable compression deformity at the thoracolumbar junction. IMPRESSION: 1. Mild cardiomegaly. Negative for edema or acute pulmonary infiltrate. Electronically Signed   By: Donavan Foil M.D.   On: 08/24/2017 23:07    Procedures Procedures (including critical care time)  Medications Ordered in ED Medications  albuterol (PROVENTIL) (2.5 MG/3ML) 0.083% nebulizer solution 5 mg (5 mg Nebulization Given 08/24/17 2227)  ondansetron (ZOFRAN) injection 4 mg (4 mg Intravenous Given 08/24/17 2242)  ipratropium-albuterol (DUONEB) 0.5-2.5 (3) MG/3ML nebulizer solution 3 mL (3 mLs Nebulization Given 08/24/17 2241)     Initial Impression / Assessment and Plan / ED Course  I have reviewed the triage vital signs and the nursing notes.  Pertinent labs & imaging results that were available during my care of the patient were reviewed by me and considered in my medical decision making (see chart for details).    Pt is feeling a little better, but still very sob.  The pt's O2 sats dropped to 88 just with moving to the side of the bed.  Pt d/w Dr. Lorin Mercy (triad) who will admit.  Final Clinical Impressions(s) / ED Diagnoses   Final diagnoses:  COPD exacerbation [facility])  Hypoxia    ED Discharge Orders    None       Isla Pence, MD 08/24/17 2355

## 2017-08-24 NOTE — ED Notes (Signed)
Patient transported to X-ray 

## 2017-08-24 NOTE — H&P (Signed)
History and Physical    Rachel Rangel [id number] DOB: Mar 09, 1934 DOA: 08/24/2017  PCP: Antony Contras, MD Consultants:  None Patient coming from: SNF (she doesn't know the name); NOK: [name], daughter  Chief Complaint: SOB  HPI: Rachel Rangel is a 83 y.o. female with medical history significant of dementia; DM; and HTN presenting with SOB.  Patient reports that she has been "having some problems with, um, see, I can't even think now.  With breathing. ... To some degree for a couple of weeks".  Some cough, nonproductive.  No fevers.  No sick contacts.  Denies problems with her breathing usually.  +LE edema for a couple of months.  No chest pain.  History is limited by her dementia.   ED Course: SOB for a few days.  125 mg Sloumedrol and Alubterol with EMS.  Another neb in ER.  No formal diagnosis of COPD.  Wheezing improved, O2 sats ok at rest but decreased to 88% with minimal movement.    Review of Systems: As per HPI; otherwise review of systems reviewed and negative. This may not be fully accurate due to her cognitive function.  Ambulatory Status:   Ambulates with a walker  Past Medical History:  Diagnosis Date  . Benign hypertension   . Dementia   . Diabetes mellitus without complication ([place])   . Edema    Left pedal   . History of complete eye exam 12/25/2012   Normal eye exam, no retinopathy [facility]  . Hyperlipidemia   . Osteoporosis 2010  . Overactive bladder   . Vitamin D deficiency     Past Surgical History:  Procedure Laterality Date  . ABDOMINAL HYSTERECTOMY  1975   partial    Social History   Socioeconomic History  . Marital status: Widowed    Spouse name: Not on file  . Number of children: 1  . Years of education: College  . Highest education level: Not on file  Social Needs  . Financial resource strain: Not on file  . Food insecurity - worry: Not on file  . Food insecurity - inability: Not on file  . Transportation needs - medical: Not  on file  . Transportation needs - non-medical: Not on file  Occupational History  . Occupation: Retired Pharmacist, hospital  Tobacco Use  . Smoking status: Former Smoker    Packs/day: 1.00    Years: 40.00    Pack years: 40.00    Types: Cigarettes    Last attempt to quit: 09/30/2014    Years since quitting: 2.9  . Smokeless tobacco: Never Used  Substance and Sexual Activity  . Alcohol use: Yes    Alcohol/week: 0.0 oz    Comment: ocass  . Drug use: No  . Sexual activity: Not on file  Other Topics Concern  . Not on file  Social History Narrative   Lives at [facility] [place]. Moved there in January 2017.       Caffeine use: ocass    Allergies  Allergen Reactions  . Lipitor [Atorvastatin]     Intolerant to 40 MG due to myalgias, however tolerates 20 MG dose  . Penicillins     Can't recall---Per [facility] records states nausea Has patient had a PCN reaction causing immediate rash, facial/tongue/throat swelling, SOB or lightheadedness with hypotension: unknown Has patient had a PCN reaction causing severe rash involving mucus membranes or skin necrosis: unknown Has patient had a PCN reaction that required hospitalization unknown Has patient had a PCN reaction  occurring within the last 10 years: unknown      Family History  Problem Relation Age of Onset  . Pancreatic cancer Mother   . Glaucoma Mother   . Hypertension Father   . Hypertension Maternal Grandmother   . Glaucoma Maternal Grandfather   . Hypertension Paternal Grandmother   . Hypertension Paternal Grandfather   . Glaucoma Maternal Aunt   . Glaucoma Maternal Aunt   . Dementia Neg Hx     Prior to Admission medications   Medication Sig Start Date End Date Taking? Authorizing Provider  Acetaminophen (APAP 500 PO) Take 1 tablet by mouth every 6 (six) hours as needed.    [provider]  amLODipine (NORVASC) 10 MG tablet Take 10 mg by mouth daily. 11/14/15   [provider]  Cholecalciferol (VITAMIN D3)  2000 UNITS capsule Take 4,000 Units by mouth daily.    [provider]  citalopram (CELEXA) 10 MG tablet Take 1 tablet (10 mg total) by mouth daily. 12/25/15   Melvenia Beam, MD  donepezil (ARICEPT) 10 MG tablet Take 1 tablet (10 mg total) by mouth at bedtime. Start with 1/2 tablet for 2 weeks then increase to a whole tablet 12/25/15   Melvenia Beam, MD  furosemide (LASIX) 20 MG tablet Take 10 mg by mouth daily.    [provider]  ibuprofen (ADVIL,MOTRIN) 200 MG tablet Take 200 mg by mouth every 6 (six) hours as needed.    [provider]  irbesartan (AVAPRO) 150 MG tablet Take 1 tablet (150 mg total) by mouth daily. 05/08/17   Tanda Rockers, MD  loratadine (CLARITIN) 10 MG tablet Take 10 mg by mouth daily as needed for allergies.    [provider]  mineral oil liquid Take 15 mLs by mouth daily as needed for moderate constipation.    [provider]  nystatin cream (MYCOSTATIN) Apply 1 application topically 2 (two) times daily. To groin and buttocks 12/09/15   [provider]  Pioglitazone HCl-Metformin HCl (ACTOPLUS MET XR) 30-1000 MG TB24 Take 1 tablet by mouth daily.    [provider]  sodium chloride (OCEAN) 0.65 % nasal spray Place 1 spray into the nose at bedtime as needed for congestion.    [provider]    Physical Exam: Vitals:   08/24/17 2208 08/24/17 2242 08/25/17 0000 08/25/17 0030  BP:  (!) 157/72 136/61 (!) 132/45  Pulse:  67 68 64  Resp:  17 18 13   Temp:      TempSrc:      SpO2:  100% 98% 95%  Weight: 90.7 kg (200 lb)     Height: 5' 2"  (1.575 m)        General:  Appears calm and comfortable and is NAD Eyes:  PERRL, EOMI, normal lids, iris ENT:  Hard of hearing,normal lips & tongue, mmm Neck:  no LAD, masses or thyromegaly; no carotid bruits Cardiovascular:  RRR, no m/r/g. No LE edema.  Respiratory:  Diffuse wheezing, moderate air movement.  Normal respiratory effort. Abdomen:  soft, NT, ND,  NABS Back:   normal alignment, no CVAT Skin:  no rash or induration seen on limited exam Musculoskeletal:  grossly normal tone BUE/BLE, good ROM, no bony abnormality Lower extremity:  No LE edema.  Limited foot exam with no ulcerations.  2+ distal pulses. Psychiatric:  grossly normal mood and affect, speech fluent and appropriate, oriented to person Neurologic:  CN 2-12 grossly intact, moves all extremities in coordinated fashion, sensation  intact    Radiological Exams on Admission: Dg Chest 2 View  Result Date: 08/24/2017 CLINICAL DATA:  Shortness of breath with nausea EXAM: CHEST  2 VIEW COMPARISON:  07/08/2017 FINDINGS: Mild cardiomegaly with aortic atherosclerosis. No pleural effusion. Coarse interstitial opacity likely chronic. Small focus of atelectasis or scar in the left mid lung. Negative for pneumothorax. Stable compression deformity at the thoracolumbar junction. IMPRESSION: 1. Mild cardiomegaly. Negative for edema or acute pulmonary infiltrate. Electronically Signed   By: Donavan Foil M.D.   On: 08/24/2017 23:07    EKG: Independently reviewed.  NSR with rate 73; nonspecific ST changes with no evidence of acute ischemia   Labs on Admission: I have personally reviewed the available labs and imaging studies at the time of the admission.  Pertinent labs:   Glucose 182 BUN 25/Creatinine 1.10/GFR 46; 15/0.89/65 on 8/9 Troponin 0.00 WBC 10.2  Assessment/Plan Principal Problem:   Acute respiratory failure with hypoxia (HCC) Active Problems:   Diabetes mellitus type 2, controlled, without complications (HCC)   Dementia without behavioral disturbance   Essential hypertension   COPD exacerbation (HCC)   Acute respiratory failure associated with a COPD exacerbation -Patient's shortness of breath and productive cough are most likely caused by acute COPD exacerbation.  She was markedly wheezing in the ambulance and upon arrival in the ER and has improved with nebs and steroids  and she does have a long-standing smoking history. -CHF is a consideration; BNP is pending.  She does not have prior reported Echo and so if BNP is markedly elevated this could be pursued.  Will continue home Lasix for now. -PE is also a consideration in this patient who appears to move little and has dyspnea and hypoxia with exertion; will add D-dimer. -Chest x-ray is not consistent with pneumonia -will admit patient - without prior diagnosis of COPD and with current hypoxia, it seems likely that she will need several days of hospitalization to show sufficient improvement for discharge. -Nebulizers: scheduled Duoneb and prn albuterol -Solu-Medrol 80 mg IV BID  -No antibiotics at this time  Dementia -Continue Aricept -Behavioral issues not previously reported  HTN -Continue Norvasc and Avapro  DM -Will check A1c -hold Glucophage/Actos -Cover with moderate scale SSI    DVT prophylaxis:  Lovenox  Code Status: DNR - confirmed with patient Family Communication: None present Disposition Plan:  Home once clinically improved Consults called: SW; PT/OT/RT/Nutrition Admission status: Admit - It is my clinical opinion that admission to INPATIENT is reasonable and necessary because this patient will require at least 2 midnights in the hospital to treat this condition based on the medical complexity of the problems presented.  Given the aforementioned information, the predictability of an adverse outcome is felt to be significant.    Karmen Bongo MD [facility]  If note is complete, please contact covering daytime or nighttime physician. [email or web address] Password [facility]  08/25/2017, 12:53 AM

## 2017-08-24 NOTE — ED Triage Notes (Signed)
Per EMS, pt presents with SOB, stridor in upper lobes and wheezing throughout. Mild lower limb edema. 8L O2 nebulizer, 10mg  albuterol, 125mg  solumedrol, 1mg  atrovent en route.

## 2017-08-25 ENCOUNTER — Other Ambulatory Visit: Payer: Self-pay

## 2017-08-25 ENCOUNTER — Inpatient Hospital Stay (HOSPITAL_COMMUNITY): Payer: Medicare Other

## 2017-08-25 ENCOUNTER — Encounter (HOSPITAL_COMMUNITY): Payer: Self-pay | Admitting: Internal Medicine

## 2017-08-25 LAB — GLUCOSE, CAPILLARY
GLUCOSE-CAPILLARY: 342 mg/dL — AB (ref 65–99)
GLUCOSE-CAPILLARY: 381 mg/dL — AB (ref 65–99)
GLUCOSE-CAPILLARY: 175 mg/dL — AB (ref 65–99)
GLUCOSE-CAPILLARY: 254 mg/dL — AB (ref 65–99)
Glucose-Capillary: 324 mg/dL — ABNORMAL HIGH (ref 65–99)

## 2017-08-25 LAB — CBC
HEMATOCRIT: 36.4 % (ref 36.0–46.0)
Hemoglobin: 11.5 g/dL — ABNORMAL LOW (ref 12.0–15.0)
MCH: 29.5 pg (ref 26.0–34.0)
MCHC: 31.6 g/dL (ref 30.0–36.0)
MCV: 93.3 fL (ref 78.0–100.0)
Platelets: 260 10*3/uL (ref 150–400)
RBC: 3.9 MIL/uL (ref 3.87–5.11)
RDW: 14.1 % (ref 11.5–15.5)
WBC: 11.6 10*3/uL — ABNORMAL HIGH (ref 4.0–10.5)

## 2017-08-25 LAB — BASIC METABOLIC PANEL
Anion gap: 14 (ref 5–15)
BUN: 31 mg/dL — AB (ref 6–20)
CO2: 20 mmol/L — ABNORMAL LOW (ref 22–32)
Calcium: 9.4 mg/dL (ref 8.9–10.3)
Chloride: 101 mmol/L (ref 101–111)
Creatinine, Ser: 1.49 mg/dL — ABNORMAL HIGH (ref 0.44–1.00)
GFR calc Af Amer: 37 mL/min — ABNORMAL LOW (ref >60)
GFR, EST NON AFRICAN AMERICAN: 32 mL/min — AB (ref >60)
GLUCOSE: 390 mg/dL — AB (ref 65–99)
POTASSIUM: 4 mmol/L (ref 3.5–5.1)
Sodium: 135 mmol/L (ref 135–145)

## 2017-08-25 LAB — BRAIN NATRIURETIC PEPTIDE: B Natriuretic Peptide: 195.4 pg/mL — ABNORMAL HIGH (ref 0.0–100.0)

## 2017-08-25 LAB — MRSA PCR SCREENING: MRSA by PCR: NEGATIVE

## 2017-08-25 LAB — D-DIMER, QUANTITATIVE (NOT AT ARMC): D DIMER QUANT: 0.66 ug{FEU}/mL — AB (ref 0.00–0.50)

## 2017-08-25 MED ORDER — CITALOPRAM HYDROBROMIDE 20 MG PO TABS
10.0000 mg | ORAL_TABLET | Freq: Every day | ORAL | Status: DC
  Administered 2017-08-25 – 2017-08-30 (×6): 10 mg via ORAL
  Filled 2017-08-25 (×6): qty 1

## 2017-08-25 MED ORDER — ACETAMINOPHEN 325 MG PO TABS: 650.0000 mg | ORAL_TABLET | Freq: Four times a day (QID) | ORAL | Status: DC | PRN

## 2017-08-25 MED ORDER — ONDANSETRON HCL 4 MG PO TABS: 4.0000 mg | ORAL_TABLET | Freq: Four times a day (QID) | ORAL | Status: DC | PRN

## 2017-08-25 MED ORDER — GUAIFENESIN-DM 100-10 MG/5ML PO SYRP
10.0000 mL | ORAL_SOLUTION | ORAL | Status: DC | PRN
  Administered 2017-08-25: 10 mL via ORAL
  Filled 2017-08-25: qty 10

## 2017-08-25 MED ORDER — METHYLPREDNISOLONE SODIUM SUCC 125 MG IJ SOLR
60.0000 mg | Freq: Two times a day (BID) | INTRAMUSCULAR | Status: DC
  Administered 2017-08-25 – 2017-08-26 (×3): 60 mg via INTRAVENOUS
  Filled 2017-08-25 (×3): qty 2

## 2017-08-25 MED ORDER — ORAL CARE MOUTH RINSE
15.0000 mL | Freq: Two times a day (BID) | OROMUCOSAL | Status: DC
  Administered 2017-08-25 – 2017-08-29 (×4): 15 mL via OROMUCOSAL

## 2017-08-25 MED ORDER — INSULIN ASPART 100 UNIT/ML ~~LOC~~ SOLN
0.0000 [IU] | Freq: Every day | SUBCUTANEOUS | Status: DC
  Administered 2017-08-25: 4 [IU] via SUBCUTANEOUS
  Administered 2017-08-25: 3 [IU] via SUBCUTANEOUS

## 2017-08-25 MED ORDER — IRBESARTAN 300 MG PO TABS
300.0000 mg | ORAL_TABLET | Freq: Every day | ORAL | Status: DC
  Administered 2017-08-25 – 2017-08-30 (×6): 300 mg via ORAL
  Filled 2017-08-25 (×6): qty 1

## 2017-08-25 MED ORDER — ACETAMINOPHEN 650 MG RE SUPP: 650.0000 mg | Freq: Four times a day (QID) | RECTAL | Status: DC | PRN

## 2017-08-25 MED ORDER — ALBUTEROL SULFATE (2.5 MG/3ML) 0.083% IN NEBU
2.5000 mg | INHALATION_SOLUTION | RESPIRATORY_TRACT | Status: DC | PRN
  Administered 2017-08-25: 2.5 mg via RESPIRATORY_TRACT
  Filled 2017-08-25: qty 3

## 2017-08-25 MED ORDER — FUROSEMIDE 40 MG PO TABS
40.0000 mg | ORAL_TABLET | Freq: Every day | ORAL | Status: DC
  Administered 2017-08-25 – 2017-08-26 (×2): 40 mg via ORAL
  Filled 2017-08-25 (×2): qty 1

## 2017-08-25 MED ORDER — ONDANSETRON HCL 4 MG/2ML IJ SOLN: 4.0000 mg | Freq: Four times a day (QID) | INTRAMUSCULAR | Status: DC | PRN

## 2017-08-25 MED ORDER — INSULIN ASPART 100 UNIT/ML ~~LOC~~ SOLN
0.0000 [IU] | Freq: Three times a day (TID) | SUBCUTANEOUS | Status: DC
  Administered 2017-08-25: 15 [IU] via SUBCUTANEOUS
  Administered 2017-08-25: 11 [IU] via SUBCUTANEOUS

## 2017-08-25 MED ORDER — DOCUSATE SODIUM 100 MG PO CAPS
100.0000 mg | ORAL_CAPSULE | Freq: Two times a day (BID) | ORAL | Status: DC
  Administered 2017-08-25 – 2017-08-30 (×6): 100 mg via ORAL
  Filled 2017-08-25 (×11): qty 1

## 2017-08-25 MED ORDER — ENOXAPARIN SODIUM 40 MG/0.4ML ~~LOC~~ SOLN
40.0000 mg | Freq: Every day | SUBCUTANEOUS | Status: DC
  Administered 2017-08-25 – 2017-08-29 (×6): 40 mg via SUBCUTANEOUS
  Filled 2017-08-25 (×6): qty 0.4

## 2017-08-25 MED ORDER — POTASSIUM CHLORIDE CRYS ER 20 MEQ PO TBCR
20.0000 meq | EXTENDED_RELEASE_TABLET | Freq: Every day | ORAL | Status: DC
  Administered 2017-08-25 – 2017-08-26 (×2): 20 meq via ORAL
  Filled 2017-08-25 (×2): qty 1

## 2017-08-25 MED ORDER — INSULIN ASPART 100 UNIT/ML ~~LOC~~ SOLN
0.0000 [IU] | Freq: Three times a day (TID) | SUBCUTANEOUS | Status: DC
  Administered 2017-08-25: 4 [IU] via SUBCUTANEOUS
  Administered 2017-08-26: 7 [IU] via SUBCUTANEOUS
  Administered 2017-08-26: 4 [IU] via SUBCUTANEOUS
  Administered 2017-08-26: 15 [IU] via SUBCUTANEOUS
  Administered 2017-08-27: 7 [IU] via SUBCUTANEOUS
  Administered 2017-08-27: 11 [IU] via SUBCUTANEOUS
  Administered 2017-08-27: 3 [IU] via SUBCUTANEOUS
  Administered 2017-08-28: 7 [IU] via SUBCUTANEOUS
  Administered 2017-08-28 (×2): 4 [IU] via SUBCUTANEOUS
  Administered 2017-08-29: 7 [IU] via SUBCUTANEOUS
  Administered 2017-08-29: 4 [IU] via SUBCUTANEOUS
  Administered 2017-08-29: 7 [IU] via SUBCUTANEOUS
  Administered 2017-08-30: 3 [IU] via SUBCUTANEOUS
  Administered 2017-08-30: 11 [IU] via SUBCUTANEOUS

## 2017-08-25 MED ORDER — IPRATROPIUM-ALBUTEROL 0.5-2.5 (3) MG/3ML IN SOLN
3.0000 mL | Freq: Three times a day (TID) | RESPIRATORY_TRACT | Status: DC
  Administered 2017-08-25 – 2017-08-26 (×3): 3 mL via RESPIRATORY_TRACT
  Filled 2017-08-25 (×4): qty 3

## 2017-08-25 MED ORDER — AMLODIPINE BESYLATE 10 MG PO TABS
10.0000 mg | ORAL_TABLET | Freq: Every day | ORAL | Status: DC
  Administered 2017-08-25 – 2017-08-30 (×6): 10 mg via ORAL
  Filled 2017-08-25 (×6): qty 1

## 2017-08-25 MED ORDER — DONEPEZIL HCL 10 MG PO TABS
10.0000 mg | ORAL_TABLET | Freq: Every day | ORAL | Status: DC
  Administered 2017-08-25 – 2017-08-29 (×6): 10 mg via ORAL
  Filled 2017-08-25 (×6): qty 1

## 2017-08-25 MED ORDER — IPRATROPIUM-ALBUTEROL 0.5-2.5 (3) MG/3ML IN SOLN: 3.0000 mL | Freq: Four times a day (QID) | RESPIRATORY_TRACT | Status: DC

## 2017-08-25 NOTE — ED Notes (Signed)
ED TO INPATIENT HANDOFF REPORT  Name/Age/Gender Rachel Rangel 83 y.o. female  Code Status Code Status History    Date Active Date Inactive Code Status Order ID Comments User Context   09/07/2015 18:04 09/11/2015 21:46 Full Code [id number]  Verlee Monte, MD Inpatient      Home/SNF/Other Home  Chief Complaint Shortness of Breath   Level of Care/Admitting Diagnosis ED Disposition    ED Disposition Condition [place]: [facility] [[id number]]  Level of Care: Med-Surg [16]  Diagnosis: COPD exacerbation [facility]) [[id number]]  Admitting Physician: Karmen Bongo [[id number]]  Attending Physician: Karmen Bongo [[id number]]  Estimated length of stay: 3 - 4 days  Certification:: I certify this patient will need inpatient services for at least 2 midnights  PT Class (Do Not Modify): Inpatient [101]  PT Acc Code (Do Not Modify): Private [1]       Medical History Past Medical History:  Diagnosis Date  . Benign hypertension   . Diabetes mellitus without complication ([place])   . Edema    Left pedal   . History of complete eye exam 12/25/2012   Normal eye exam, no retinopathy [facility]  . Hyperlipidemia   . Osteoporosis 2010  . Overactive bladder   . Vitamin D deficiency     Allergies Allergies  Allergen Reactions  . Lipitor [Atorvastatin]     Intolerant to 40 MG due to myalgias, however tolerates 20 MG dose  . Penicillins     Can't recall---Per [facility] records states nausea Has patient had a PCN reaction causing immediate rash, facial/tongue/throat swelling, SOB or lightheadedness with hypotension: unknown Has patient had a PCN reaction causing severe rash involving mucus membranes or skin necrosis: unknown Has patient had a PCN reaction that required hospitalization unknown Has patient had a PCN reaction occurring within the last 10 years: unknown      IV Location/Drains/Wounds Patient Lines/Drains/Airways Status   Active  Line/Drains/Airways    Name:   Placement date:   Placement time:   Site:   Days:   Peripheral IV 08/24/17 Left Hand   08/24/17    2159    Hand   1          Labs/Imaging Results for orders placed or performed during the hospital encounter of 08/24/17 (from the past 48 hour(s))  Basic metabolic panel     Status: Abnormal   Collection Time: 08/24/17 10:20 PM  Result Value Ref Range   Sodium 136 135 - 145 mmol/L   Potassium 3.6 3.5 - 5.1 mmol/L   Chloride 101 101 - 111 mmol/L   CO2 24 22 - 32 mmol/L   Glucose, Bld 182 (H) 65 - 99 mg/dL   BUN 25 (H) 6 - 20 mg/dL   Creatinine, Ser 1.10 (H) 0.44 - 1.00 mg/dL   Calcium 9.6 8.9 - 10.3 mg/dL   GFR calc non Af Amer 46 (L) >60 mL/min   GFR calc Af Amer 53 (L) >60 mL/min    Comment: (NOTE) The eGFR has been calculated using the CKD EPI equation. This calculation has not been validated in all clinical situations. eGFR's persistently <60 mL/min signify possible Chronic Kidney Disease.    Anion gap 11 5 - 15  CBC with Differential     Status: None   Collection Time: 08/24/17 10:20 PM  Result Value Ref Range   WBC 10.2 4.0 - 10.5 K/uL   RBC 4.15 3.87 - 5.11 MIL/uL   Hemoglobin 12.5 12.0 -  15.0 g/dL   HCT 37.9 36.0 - 46.0 %   MCV 91.3 78.0 - 100.0 fL   MCH 30.1 26.0 - 34.0 pg   MCHC 33.0 30.0 - 36.0 g/dL   RDW 14.0 11.5 - 15.5 %   Platelets 270 150 - 400 K/uL   Neutrophils Relative % 51 %   Neutro Abs 5.2 1.7 - 7.7 K/uL   Lymphocytes Relative 39 %   Lymphs Abs 4.0 0.7 - 4.0 K/uL   Monocytes Relative 6 %   Monocytes Absolute 0.6 0.1 - 1.0 K/uL   Eosinophils Relative 4 %   Eosinophils Absolute 0.4 0.0 - 0.7 K/uL   Basophils Relative 0 %   Basophils Absolute 0.0 0.0 - 0.1 K/uL  I-stat troponin, ED     Status: None   Collection Time: 08/24/17 10:40 PM  Result Value Ref Range   Troponin i, poc 0.00 0.00 - 0.08 ng/mL   Comment 3            Comment: Due to the release kinetics of cTnI, a negative result within the first hours of the  onset of symptoms does not rule out myocardial infarction with certainty. If myocardial infarction is still suspected, repeat the test at appropriate intervals.    Dg Chest 2 View  Result Date: 08/24/2017 CLINICAL DATA:  Shortness of breath with nausea EXAM: CHEST  2 VIEW COMPARISON:  07/08/2017 FINDINGS: Mild cardiomegaly with aortic atherosclerosis. No pleural effusion. Coarse interstitial opacity likely chronic. Small focus of atelectasis or scar in the left mid lung. Negative for pneumothorax. Stable compression deformity at the thoracolumbar junction. IMPRESSION: 1. Mild cardiomegaly. Negative for edema or acute pulmonary infiltrate. Electronically Signed   By: Donavan Foil M.D.   On: 08/24/2017 23:07    Pending Labs Unresulted Labs (From admission, onward)   Start     Ordered   08/24/17 2220  Brain natriuretic peptide  (ED Shortness of Breath)  Once,   STAT     08/24/17 2220      Vitals/Pain Today's Vitals   08/24/17 2208 08/24/17 2242 08/25/17 0000 08/25/17 0030  BP:  (!) 157/72 136/61 (!) 132/45  Pulse:  67 68 64  Resp:  _0 Temp:      TempSrc:      SpO2:  100% 98% 95%  Weight: 200 lb (90.7 kg)     Height: 5' 2" (1.575 m)       Isolation Precautions No active isolations  Medications Medications  albuterol (PROVENTIL) (2.5 MG/3ML) 0.083% nebulizer solution 5 mg (5 mg Nebulization Given 08/24/17 2227)  ondansetron (ZOFRAN) injection 4 mg (4 mg Intravenous Given 08/24/17 2242)  ipratropium-albuterol (DUONEB) 0.5-2.5 (3) MG/3ML nebulizer solution 3 mL (3 mLs Nebulization Given 08/24/17 2241)    Mobility walks

## 2017-08-25 NOTE — Progress Notes (Addendum)
PROGRESS NOTE    Rachel Rangel  [id number] DOB: 07-14-1934 DOA: 08/24/2017 PCP: Tally JoeSwayne, David, MD    Brief Narrative:  Rachel Rangel is a 83 y.o. female with medical history significant of dementia; DM; and HTN presenting with SOB, admitted for acute respiratory failure secondary to possibly COPD exacerbation with a component of CHF.   Assessment & Plan:   Principal Problem:   Acute respiratory failure with hypoxia (HCC) Active Problems:   Diabetes mellitus type 2, controlled, without complications (HCC)   Dementia without behavioral disturbance   Essential hypertension   COPD exacerbation (HCC)   Acute respiratory failure with hypoxia probably secondary to acute COPD exacerbation, component of CHF.  View of her elevated BNP, on Lasix at home we will get a echocardiogram. Started the patient on IV Solu-Medrol, duo nebs, physical therapy evaluation and check ambulating oxygen levels.  Low-grade fevers mildly elevated WBC count.  D-dimer slightly elevated at 0.66, VQ scan ordered for evaluation of pulmonary embolism. Pt continues to have sob, requiring oxygen and IV steroids.    Strict intake and output, daily weights, resume Lasix 40 mg daily. Robitussin for cough and sputum if she has productive cough.   Type 2 diabetes mellitus Resume sliding scale insulin. CBG (last 3)  Recent Labs    08/25/17 0128 08/25/17 0732 08/25/17 1127  GLUCAP 324* 342* 381*  Hyperglycemia secondary to IV steroids change sliding  to resistant scale.    Acute kidney injury secondary to dehydration/prerenal in origin Baseline creatinine less than 1.  Currently is 1.4.   Get UA, ultrasound kidney to rule out obstructive causes.  Repeat renal panel meters in a.m.   Hypertension blood pressure well controlled   Dementia no behavioral abnormalities Resume Aricept.     DVT prophylaxis: Lovenox Code Status: DNR Family Communication: None at bedside Disposition Plan: Pending  evaluation of CHF, PT evaluation.   Consultants:   None   Procedures: Echocardiogram pending  VQ scan pending   Antimicrobials:    Subjective: Patient reports her breathing is better than yesterday, continues to have some occasional cough.  No chest pain.  Objective: Vitals:   08/25/17 0123 08/25/17 0303 08/25/17 0458 08/25/17 0727  BP: (!) 181/55  (!) 134/41   Pulse: 71  64 68  Resp: 18  18 18   Temp: 98 F (36.7 C)  99.2 F (37.3 C)   TempSrc: Oral  Oral   SpO2: 97% 96% 95% 92%  Weight: 96.6 kg (212 lb 15.4 oz)     Height: 5\' 2"  (1.575 m)       Intake/Output Summary (Last 24 hours) at 08/25/2017 1129 Last data filed at 08/25/2017 1023 Gross per 24 hour  Intake 360 ml  Output 150 ml  Net 210 ml   Filed Weights   08/24/17 2208 08/25/17 0123  Weight: 90.7 kg (200 lb) 96.6 kg (212 lb 15.4 oz)    Examination:  General exam: Appears calm and comfortable , 2 L nasal cannula oxygen Respiratory system: Bilateral basilar Rales, scattered wheezing anteriorly.  No rhonchi heard Cardiovascular system: S1 & S2 heard, RRR. No JVD, murmurs, rubs, gallops or clicks.  Trace pedal edema Gastrointestinal system: Abdomen is nondistended, soft and nontender. No organomegaly or masses felt. Normal bowel sounds heard. Central nervous system: Alert and oriented. No focal neurological deficits.  Hard of hearing Extremities: Symmetric 5 x 5 power.  Trace pedal edema, no cyanosis or clubbing Skin: No rashes, lesions or ulcers Psychiatry:  Mood & affect appropriate.  Data Reviewed: I have personally reviewed following labs and imaging studies  CBC: Recent Labs  Lab 08/24/17 2220 08/25/17 0431  WBC 10.2 11.6*  NEUTROABS 5.2  --   HGB 12.5 11.5*  HCT 37.9 36.4  MCV 91.3 93.3  PLT 270 260   Basic Metabolic Panel: Recent Labs  Lab 08/24/17 2220 08/25/17 0431  NA 136 135  K 3.6 4.0  CL 101 101  CO2 24 20*  GLUCOSE 182* 390*  BUN 25* 31*  CREATININE 1.10* 1.49*    CALCIUM 9.6 9.4   GFR: Estimated Creatinine Clearance: 32.1 mL/min (A) (by C-G formula based on SCr of 1.49 mg/dL (H)). Liver Function Tests: No results for input(s): AST, ALT, ALKPHOS, BILITOT, PROT, ALBUMIN in the last 168 hours. No results for input(s): LIPASE, AMYLASE in the last 168 hours. No results for input(s): AMMONIA in the last 168 hours. Coagulation Profile: No results for input(s): INR, PROTIME in the last 168 hours. Cardiac Enzymes: No results for input(s): CKTOTAL, CKMB, CKMBINDEX, TROPONINI in the last 168 hours. BNP (last 3 results) Recent Labs    05/08/17 1243  PROBNP 254.0*   HbA1C: No results for input(s): HGBA1C in the last 72 hours. CBG: Recent Labs  Lab 08/25/17 0128 08/25/17 0732 08/25/17 1127  GLUCAP 324* 342* 381*   Lipid Profile: No results for input(s): CHOL, HDL, LDLCALC, TRIG, CHOLHDL, LDLDIRECT in the last 72 hours. Thyroid Function Tests: No results for input(s): TSH, T4TOTAL, FREET4, T3FREE, THYROIDAB in the last 72 hours. Anemia Panel: No results for input(s): VITAMINB12, FOLATE, FERRITIN, TIBC, IRON, RETICCTPCT in the last 72 hours. Sepsis Labs: No results for input(s): PROCALCITON, LATICACIDVEN in the last 168 hours.  Recent Results (from the past 240 hour(s))  MRSA PCR Screening     Status: None   Collection Time: 08/25/17  4:33 AM  Result Value Ref Range Status   MRSA by PCR NEGATIVE NEGATIVE Final    Comment:        The GeneXpert MRSA Assay (FDA approved for NASAL specimens only), is one component of a comprehensive MRSA colonization surveillance program. It is not intended to diagnose MRSA infection nor to guide or monitor treatment for MRSA infections.          Radiology Studies: Dg Chest 2 View  Result Date: 08/24/2017 CLINICAL DATA:  Shortness of breath with nausea EXAM: CHEST  2 VIEW COMPARISON:  07/08/2017 FINDINGS: Mild cardiomegaly with aortic atherosclerosis. No pleural effusion. Coarse interstitial  opacity likely chronic. Small focus of atelectasis or scar in the left mid lung. Negative for pneumothorax. Stable compression deformity at the thoracolumbar junction. IMPRESSION: 1. Mild cardiomegaly. Negative for edema or acute pulmonary infiltrate. Electronically Signed   By: Jasmine PangKim  Fujinaga M.D.   On: 08/24/2017 23:07        Scheduled Meds: . amLODipine  10 mg Oral Daily  . citalopram  10 mg Oral Daily  . docusate sodium  100 mg Oral BID  . donepezil  10 mg Oral QHS  . enoxaparin (LOVENOX) injection  40 mg Subcutaneous QHS  . furosemide  40 mg Oral QAC breakfast  . insulin aspart  0-15 Units Subcutaneous TID WC  . insulin aspart  0-5 Units Subcutaneous QHS  . ipratropium-albuterol  3 mL Nebulization TID  . irbesartan  300 mg Oral Daily  . mouth rinse  15 mL Mouth Rinse BID  . methylPREDNISolone (SOLU-MEDROL) injection  60 mg Intravenous Q12H  . potassium chloride SA  20 mEq Oral Daily  Continuous Infusions:   LOS: 1 day    Time spent: 35 minutes.     Kathlen Mody, MD [facility] Pager [phone]   If 7PM-7AM, please contact night-coverage [email or web address] Password [facility] 08/25/2017, 11:29 AM

## 2017-08-25 NOTE — Evaluation (Signed)
Physical Therapy Evaluation Patient Details Name: Rachel Rangel MRN: [id number] DOB: 1934-09-12 Today's Date: 08/25/2017   History of Present Illness  Rachel Rangel is a 83 y.o. female with medical history significant of dementia; DM; and HTN presenting with SOB  Clinical Impression  The patient mobilized to recliner with mod assist of 1. Oxygen saturation> 91% on 3 liters. The patient reports that she ambulated with RW PTA at ALF. Pt admitted with above diagnosis. Pt currently with functional limitations due to the deficits listed below (see PT Problem List).  Pt will benefit from skilled PT to increase their independence and safety with mobility to allow discharge to the venue listed below.       Follow Up Recommendations Home health PT- at ALF    Equipment Recommendations  None recommended by PT    Recommendations for Other Services       Precautions / Restrictions Precautions Precautions: Fall      Mobility  Bed Mobility Overal bed mobility: Needs Assistance Bed Mobility: Supine to Sit     Supine to sit: Mod assist;HOB elevated     General bed mobility comments: assist to slide to bed edge and assist with trunk  Transfers Overall transfer level: Needs assistance Equipment used: Rolling walker (2 wheeled) Transfers: Sit to/from Stand Sit to Stand: Mod assist         General transfer comment: steady assist to rise from bed.   Ambulation/Gait Ambulation/Gait assistance: Mod assist Ambulation Distance (Feet): 5 Feet Assistive device: Rolling walker (2 wheeled) Gait Pattern/deviations: Step-to pattern     General Gait Details: small shuffling steps to recliner.  Stairs            Wheelchair Mobility    Modified Rankin (Stroke Patients Only)       Balance Overall balance assessment: Needs assistance Sitting-balance support: Bilateral upper extremity supported;Feet supported Sitting balance-Leahy Scale: Fair     Standing balance support:  During functional activity;Bilateral upper extremity supported Standing balance-Leahy Scale: Poor                               Pertinent Vitals/Pain Pain Assessment: No/denies pain    Home Living Family/patient expects to be discharged to:: Assisted living                      Prior Function           Comments: reports that she walks with RW without assistance     Hand Dominance        Extremity/Trunk Assessment        Lower Extremity Assessment Lower Extremity Assessment: Generalized weakness       Communication   Communication: HOH  Cognition Arousal/Alertness: Awake/alert Behavior During Therapy: WFL for tasks assessed/performed Overall Cognitive Status: History of cognitive impairments - at baseline                                 General Comments: oriented to [employer]      General Comments      Exercises     Assessment/Plan    PT Assessment Patient needs continued PT services  PT Problem List Decreased strength;Decreased activity tolerance;Decreased balance;Decreased mobility;Cardiopulmonary status limiting activity;Decreased knowledge of precautions;Decreased safety awareness       PT Treatment Interventions DME instruction;Gait training;Functional mobility training;Therapeutic activities;Patient/family education;Therapeutic exercise  PT Goals (Current goals can be found in the Care Plan section)  Acute Rehab PT Goals Patient Stated Goal: agrees to walk PT Goal Formulation: Patient unable to participate in goal setting Time For Goal Achievement: 09/08/17 Potential to Achieve Goals: Good    Frequency Min 2X/week   Barriers to discharge        Co-evaluation               AM-PAC PT "6 Clicks" Daily Activity  Outcome Measure Difficulty turning over in bed (including adjusting bedclothes, sheets and blankets)?: Unable Difficulty moving from lying on back to sitting on the side of the bed? :  Unable Difficulty sitting down on and standing up from a chair with arms (e.g., wheelchair, bedside commode, etc,.)?: Unable Help needed moving to and from a bed to chair (including a wheelchair)?: Total Help needed walking in hospital room?: Total Help needed climbing 3-5 steps with a railing? : Total 6 Click Score: 6    End of Session Equipment Utilized During Treatment: Gait belt;Oxygen Activity Tolerance: Patient tolerated treatment well Patient left: with call bell/phone within reach;with chair alarm set Nurse Communication: Mobility status PT Visit Diagnosis: Difficulty in walking, not elsewhere classified (R26.2)    Time: [id number] PT Time Calculation (min) (ACUTE ONLY): 12 min   Charges:   PT Evaluation $PT Eval Low Complexity: 1 Low     PT G CodesBlanchard Kelch:       Rachel Rangel PT [phone]  Rachel Rangel, Rachel Rangel 08/25/2017, 11:04 AM

## 2017-08-25 NOTE — Progress Notes (Signed)
CSW received consult for COPD Gold Protocol, though patient does not meet qualifications (only 1 admission within the past 6 months).  CSW signing off.   Zarin Hagmann, LCSWA Clinical Social Worker  Hospital Cell#: [phone]  

## 2017-08-25 NOTE — Care Management Note (Signed)
Case Management Note  Patient Details  Name: Diamantina ProvidenceMartha J Maj MRN: [id number] Date of Birth: 07-07-1934  Subjective/Objective:  Per [facility] rep Tim-patient active w/HHRN. PT/OT recc HHC. Ordered for HHRN/PT/OT-[facility] rep Tim aware & following fro d/c. CSW managing return back to ALF-[facility].                  Action/Plan:d/c back to ALF w/HHC.   Expected Discharge Date:  08/27/17               Expected Discharge Plan:  Assisted Living / Rest Home  In-House Referral:  Clinical Social Work  Discharge planning Services  CM Consult  Post Acute Care Choice:  [facility] Choice offered to:     DME Arranged:    DME Agency:     HH Arranged:  RN, PT, OT HH Agency:  [facility]  Status of Service:  In process, will continue to follow  If discussed at Long Length of Stay Meetings, dates discussed:    Additional Comments:  Lanier ClamMahabir, Analeah Brame, RN 08/25/2017, 2:55 PM

## 2017-08-25 NOTE — Clinical Social Work Note (Signed)
Clinical Social Work Assessment  Patient Details  Name: Rachel ProvidenceMartha J Deas MRN: [id number] Date of Birth: [birth year]  Date of referral:  08/25/17               Reason for consult:  Facility Placement, Discharge Planning                Permission sought to share information with:  Facility Industrial/product designerContact Representative Permission granted to share information::  Yes, Verbal Permission Granted  Name::        Agency::     Relationship::     Contact Information:     Housing/Transportation Living arrangements for the past 2 months:  Assisted Living Facility([facility]) Source of Information:  Patient, Adult Children Patient Interpreter Needed:  None Criminal Activity/Legal Involvement Pertinent to Current Situation/Hospitalization:  No - Comment as needed Significant Relationships:  Adult Children Lives with:  Facility Resident Do you feel safe going back to the place where you live?  Yes Need for family participation in patient care:  Yes (Comment)  Care giving concerns:  [facility]. Patient's facility reported that patient required some assistance with ADLs and that patient ambulated with a walker at baseline.    Social Worker assessment / plan:  CSW spoke with patient/patient's daughter at bedside regarding dc planning. Patient reported that she has been at her current ALF for 2 years and that they have been making changes and she is unsure how the changes will impact how she like it. Patient's daughter reported that patient stays in her room a majority of the day with the exception of going to eat her meals. Patient's daughter reported that she visits patient daily after work. Patient's daughter reported that the plan is for patient to return to ALF at discharge.   CSW will complete FL2 and assist patient with discharge back to ALF when medically stable.   Employment status:  Retired Database administratornsurance information:  Managed Medicare PT Recommendations:  Home  with Home Health Information / Referral to community resources:  Other (Comment Required)(Patient from ALF)  Patient/Family's Response to care:  Patient/patient's daughter verbalized plan to discharge back to ALF when medically stable.   Patient/Family's Understanding of and Emotional Response to Diagnosis, Current Treatment, and Prognosis:  Patient presented calm accompanied by daughter. Patient's daughter verbalized understanding of patient's diagnosis and current treatment. Patient's daughter involved in patient's care and verbalized plan for patient to dc back to ALF.   Emotional Assessment Appearance:  Appears stated age Attitude/Demeanor/Rapport:  Other(Cooperative) Affect (typically observed):  Calm Orientation:  Oriented to Self Alcohol / Substance use:  Not Applicable Psych involvement (Current and /or in the community):  No (Comment)  Discharge Needs  Concerns to be addressed:  Care Coordination Readmission within the last 30 days:  No Current discharge risk:  None Barriers to Discharge:  Continued Medical Work up   USG CorporationKimberly L Eyal Greenhaw, LCSW 08/25/2017, 4:03 PM

## 2017-08-25 NOTE — Plan of Care (Signed)
Patients respiratory status has improved from Ed.  She is not short of breath at rest.  Patient slept well.

## 2017-08-25 NOTE — Progress Notes (Signed)
Nutrition Brief Note  Consult received per COPD Gold Protocol.   Wt Readings from Last 15 Encounters:  08/25/17 212 lb 15.4 oz (96.6 kg)  05/08/17 211 lb (95.7 kg)  12/12/15 164 lb (74.4 kg)  09/07/15 180 lb (81.6 kg)    Body mass index is 38.95 kg/m. Patient meets criteria for obesity/borderline morbid obesity based on current BMI. Skin WDL. Pt with PMH of dementia, DM, HTN. She arrived to the ED with SOB and was admitted for acute respiratory failure 2/2 possible COPD exacerbation. Flow sheet review indicates that pt is a/o to self only.   Current diet order is Carb Modified and pt consumed 50% of breakfast this AM (305 kcal, 12 grams of protein). Labs and medications reviewed.  No nutrition interventions warranted at this time. If nutrition issues arise, please re-consult RD.     Trenton GammonJessica Micha Dosanjh, MS, RD, LDN, [facility] Inpatient Clinical Dietitian Pager # [phone] After hours/weekend pager # [phone]

## 2017-08-25 NOTE — Evaluation (Signed)
Occupational Therapy Evaluation Patient Details Name: Rachel ProvidenceMartha J Winchell MRN: [id number] DOB: December 27, 1933 Today's Date: 08/25/2017    History of Present Illness Rachel Rangel is a 83 y.o. female with medical history significant of dementia; DM; and HTN presenting with SOB   Clinical Impression   Pt admitted with SOB. Pt currently with functional limitations due to the deficits listed below (see OT Problem List).  Pt will benefit from skilled OT to increase their safety and independence with ADL and functional mobility for ADL to facilitate discharge to venue listed below.      Follow Up Recommendations  Home health OT;Supervision - Intermittent at ALF   Equipment Recommendations  None recommended by OT       Precautions / Restrictions Precautions Precautions: Fall      Mobility Bed Mobility Overal bed mobility: Needs Assistance Bed Mobility: Supine to Sit     Supine to sit: Mod assist;HOB elevated     General bed mobility comments: pt in chair  Transfers Overall transfer level: Needs assistance Equipment used: Rolling walker (2 wheeled) Transfers: Sit to/from Stand Sit to Stand: Mod assist;Min assist         General transfer comment: from chair    Balance Overall balance assessment: Needs assistance Sitting-balance support: Bilateral upper extremity supported;Feet supported Sitting balance-Leahy Scale: Fair     Standing balance support: During functional activity;Bilateral upper extremity supported Standing balance-Leahy Scale: Poor                             ADL either performed or assessed with clinical judgement   ADL                                               Vision Patient Visual Report: No change from baseline              Pertinent Vitals/Pain Pain Assessment: No/denies pain     Hand Dominance     Extremity/Trunk Assessment Upper Extremity Assessment Upper Extremity Assessment: Generalized weakness   Lower Extremity Assessment Lower Extremity Assessment: Generalized weakness       Communication Communication Communication: HOH   Cognition Arousal/Alertness: Awake/alert Behavior During Therapy: WFL for tasks assessed/performed Overall Cognitive Status: History of cognitive impairments - at baseline                                 General Comments: oriented to [employer]   General Comments               Home Living Family/patient expects to be discharged to:: Assisted living                                        Prior Functioning/Environment          Comments: reports that she walks with RW without assistance        OT Problem List: Decreased strength;Decreased activity tolerance;Decreased safety awareness;Impaired balance (sitting and/or standing)      OT Treatment/Interventions: Self-care/ADL training;Patient/family education    OT Goals(Current goals can be found in the care plan section) Acute Rehab OT Goals Patient Stated Goal: back to ALF OT Goal Formulation:  With patient Time For Goal Achievement: 09/08/17 Potential to Achieve Goals: Good  OT Frequency: Min 2X/week    AM-PAC PT "6 Clicks" Daily Activity     Outcome Measure Help from another person eating meals?: None Help from another person taking care of personal grooming?: A Little Help from another person toileting, which includes using toliet, bedpan, or urinal?: A Little Help from another person bathing (including washing, rinsing, drying)?: A Little Help from another person to put on and taking off regular upper body clothing?: A Little Help from another person to put on and taking off regular lower body clothing?: A Little 6 Click Score: 19   End of Session Nurse Communication: Mobility status  Activity Tolerance: Patient limited by fatigue Patient left: in chair;with call bell/phone within reach;with chair alarm set  OT Visit Diagnosis: Unsteadiness on  feet (R26.81);Muscle weakness (generalized) (M62.81)                Time: [id number] OT Time Calculation (min): 13 min Charges:  OT Treatments $Self Care/Home Management : 8-22 mins G-Codes:     Lise AuerLori Affie Gasner, OT [phone]  Einar CrowEDDING, Rachel Rangel D 08/25/2017, 12:53 PM

## 2017-08-25 NOTE — Care Management Note (Signed)
Case Management Note  Patient Details  Name: Rachel Rangel MRN: [id number] Date of Birth: 20-Nov-1933  Subjective/Objective: 83 y/o f admitted w/Acute resp failure. Hx: DNR. From [facility]F-[facility]. PT-recc HHPT. Facility has their own HHPT.CSW following for return back to [facility]F-[facility].                  Action/Plan:d/c plan ALF w/HHPT.   Expected Discharge Date:  08/27/17               Expected Discharge Plan:  Assisted Living / Rest Home  In-House Referral:  Clinical Social Work  Discharge planning Services  CM Consult  Post Acute Care Choice:    Choice offered to:     DME Arranged:    DME Agency:     HH Arranged:    HH Agency:     Status of Service:  In process, will continue to follow  If discussed at Long Length of Stay Meetings, dates discussed:    Additional Comments:  Rachel Rangel, Rachel Barasch, RN 08/25/2017, 11:42 AM

## 2017-08-25 NOTE — ED Notes (Signed)
[facility] [phone]

## 2017-08-26 ENCOUNTER — Inpatient Hospital Stay (HOSPITAL_COMMUNITY): Payer: Medicare Other

## 2017-08-26 DIAGNOSIS — I361 Nonrheumatic tricuspid (valve) insufficiency: Secondary | ICD-10-CM

## 2017-08-26 LAB — ECHOCARDIOGRAM COMPLETE
HEIGHTINCHES: 62 in
Weight: 3407.43 oz

## 2017-08-26 LAB — URINALYSIS, ROUTINE W REFLEX MICROSCOPIC
Bilirubin Urine: NEGATIVE
Glucose, UA: 50 mg/dL — AB
HGB URINE DIPSTICK: NEGATIVE
Ketones, ur: NEGATIVE mg/dL
Nitrite: NEGATIVE
PH: 5 (ref 5.0–8.0)
Protein, ur: NEGATIVE mg/dL
SPECIFIC GRAVITY, URINE: 1.014 (ref 1.005–1.030)

## 2017-08-26 LAB — GLUCOSE, CAPILLARY
GLUCOSE-CAPILLARY: 205 mg/dL — AB (ref 65–99)
GLUCOSE-CAPILLARY: 179 mg/dL — AB (ref 65–99)
Glucose-Capillary: 195 mg/dL — ABNORMAL HIGH (ref 65–99)
Glucose-Capillary: 304 mg/dL — ABNORMAL HIGH (ref 65–99)

## 2017-08-26 LAB — HEMOGLOBIN A1C
HEMOGLOBIN A1C: 6.9 % — AB (ref 4.8–5.6)
MEAN PLASMA GLUCOSE: 151 mg/dL

## 2017-08-26 LAB — BASIC METABOLIC PANEL
Anion gap: 9 (ref 5–15)
BUN: 41 mg/dL — ABNORMAL HIGH (ref 6–20)
CHLORIDE: 97 mmol/L — AB (ref 101–111)
CO2: 26 mmol/L (ref 22–32)
CREATININE: 1.33 mg/dL — AB (ref 0.44–1.00)
Calcium: 10.3 mg/dL (ref 8.9–10.3)
GFR, EST AFRICAN AMERICAN: 42 mL/min — AB (ref >60)
GFR, EST NON AFRICAN AMERICAN: 36 mL/min — AB (ref >60)
Glucose, Bld: 295 mg/dL — ABNORMAL HIGH (ref 65–99)
Potassium: 5.5 mmol/L — ABNORMAL HIGH (ref 3.5–5.1)
SODIUM: 132 mmol/L — AB (ref 135–145)

## 2017-08-26 MED ORDER — PERFLUTREN LIPID MICROSPHERE
1.0000 mL | INTRAVENOUS | Status: AC | PRN
  Administered 2017-08-26: 4 mL via INTRAVENOUS
  Filled 2017-08-26: qty 10

## 2017-08-26 MED ORDER — METHYLPREDNISOLONE SODIUM SUCC 40 MG IJ SOLR
40.0000 mg | INTRAMUSCULAR | Status: DC
  Administered 2017-08-27: 40 mg via INTRAVENOUS
  Filled 2017-08-26: qty 1

## 2017-08-26 MED ORDER — IPRATROPIUM-ALBUTEROL 0.5-2.5 (3) MG/3ML IN SOLN
3.0000 mL | Freq: Two times a day (BID) | RESPIRATORY_TRACT | Status: DC
  Administered 2017-08-26 – 2017-08-27 (×2): 3 mL via RESPIRATORY_TRACT
  Filled 2017-08-26 (×2): qty 3

## 2017-08-26 MED ORDER — PERFLUTREN LIPID MICROSPHERE
INTRAVENOUS | Status: AC
  Filled 2017-08-26: qty 10

## 2017-08-26 MED ORDER — FUROSEMIDE 10 MG/ML IJ SOLN
20.0000 mg | Freq: Once | INTRAMUSCULAR | Status: AC
  Administered 2017-08-26: 20 mg via INTRAVENOUS
  Filled 2017-08-26: qty 2

## 2017-08-26 MED ORDER — INSULIN ASPART 100 UNIT/ML ~~LOC~~ SOLN
3.0000 [IU] | Freq: Three times a day (TID) | SUBCUTANEOUS | Status: DC
  Administered 2017-08-27 – 2017-08-30 (×11): 3 [IU] via SUBCUTANEOUS

## 2017-08-26 NOTE — Progress Notes (Addendum)
PROGRESS NOTE    CHAUNTELLE AZPEITIA  ZOX:[id number] DOB: May 19, 1934 DOA: 08/24/2017 PCP: Tally Joe, MD    Brief Narrative:  Rachel Rangel is a 83 y.o. female with medical history significant of dementia; DM; and HTN presenting with SOB, admitted for acute respiratory failure secondary to possibly COPD exacerbation with a component of CHF.   Assessment & Plan:   Principal Problem:   Acute respiratory failure with hypoxia (HCC) Active Problems:   Diabetes mellitus type 2, controlled, without complications (HCC)   Dementia without behavioral disturbance   Essential hypertension   COPD exacerbation (HCC)   Acute respiratory failure with hypoxia probably secondary to acute COPD exacerbation, component of  Mild acute on chronic diastolic CHF.  View of her elevated BNP, on Lasix at home we will get a echocardiogram. Started the patient on IV Solu-Medrol, duo nebs, physical therapy evaluation and check ambulating oxygen levels.   D-dimer slightly elevated at 0.66, VQ scan ordered for evaluation of pulmonary embolism. Pt continues to have sob, requiring oxygen and IV steroids. , continues to require 2l it of [place] Oxygen.  Echocardiogram done today showed LVEF of 65% , wall motion was normal and no regional wall abnormalities. Doppler parameters show grade 1 diastolic dysfunction. Moderate AS.  Strict intake and output, daily weights, resume Lasix 40 mg daily, extra dose of IV lasix ordered.  Robitussin for cough and sputum if she has productive cough.  Started tapering steroids, wean her off oxygen and plan for d/c in the next 24 hours.    Type 2 diabetes mellitus Resume sliding scale insulin. CBG (last 3)  Recent Labs    08/25/17 2146 08/26/17 0733 08/26/17 1108  GLUCAP 254* 195* 304*  Hyperglycemia secondary to IV steroids change sliding  to resistant scale. Add novo log 3 units TIDAC.     Acute kidney injury secondary to dehydration/prerenal in origin Baseline creatinine  less than 1.  Currently is 1.4, slowly improving.  US renal ruled out obstructive causes, UA shows many bacteria and leukocytosis, with too numerous wbc count.    Hypertension blood pressure well controlled   Dementia no behavioral abnormalities Resume Aricept.  Hyperkalemia:  Unclear etiology.  Not on potassium supplementation. Asymptomatic.  Repeat K in am.    DVT prophylaxis: Lovenox Code Status: DNR Family Communication: discussed with daughter over the phone.  Disposition Plan: Home tomorrow.    Consultants:   None   Procedures: Echocardiogram pending  VQ scan pending   Antimicrobials:    Subjective: Sitting int he chair, on 2 lit [place] oxygen, clinically better than yesterday, but not back to baseline.   Objective: Vitals:   08/26/17 0635 08/26/17 0824 08/26/17 0951 08/26/17 1415  BP: (!) 153/55  (!) 146/49 111/72  Pulse: (!) 54 62 (!) 54 (!) 58  Resp: 18 18  18   Temp: 98 F (36.7 C)   98.3 F (36.8 C)  TempSrc: Oral   Oral  SpO2: 97% 96%  97%  Weight:      Height:        Intake/Output Summary (Last 24 hours) at 08/26/2017 1703 Last data filed at 08/26/2017 1450 Gross per 24 hour  Intake 480 ml  Output 1300 ml  Net -820 ml   Filed Weights   08/24/17 2208 08/25/17 0123  Weight: 90.7 kg (200 lb) 96.6 kg (212 lb 15.4 oz)    Examination:  General exam: Appears calm and comfortable , 2 L nasal cannula oxygen Respiratory system:  Scattered wheezing heard  posteriorly. Air entry fair.  Cardiovascular system: S1 & S2 heard, RRR. No JVD, murmurs,  Trace pedal edema Gastrointestinal system: Abdomen is soft non tender non distended bowel sounds heard.  Central nervous system: Alert and oriented. No focal neurological deficits.  Hard of hearing Extremities: Symmetric 5 x 5 power.  Trace pedal edema, no cyanosis or clubbing Skin: No rashes, lesions or ulcers Psychiatry:  Mood & affect appropriate.     Data Reviewed: I have personally reviewed  following labs and imaging studies  CBC: Recent Labs  Lab 08/24/17 2220 08/25/17 0431  WBC 10.2 11.6*  NEUTROABS 5.2  --   HGB 12.5 11.5*  HCT 37.9 36.4  MCV 91.3 93.3  PLT 270 260   Basic Metabolic Panel: Recent Labs  Lab 08/24/17 2220 08/25/17 0431 08/26/17 1137  NA 136 135 132*  K 3.6 4.0 5.5*  CL 101 101 97*  CO2 24 20* 26  GLUCOSE 182* 390* 295*  BUN 25* 31* 41*  CREATININE 1.10* 1.49* 1.33*  CALCIUM 9.6 9.4 10.3   GFR: Estimated Creatinine Clearance: 36 mL/min (A) (by C-G formula based on SCr of 1.33 mg/dL (H)). Liver Function Tests: No results for input(s): AST, ALT, ALKPHOS, BILITOT, PROT, ALBUMIN in the last 168 hours. No results for input(s): LIPASE, AMYLASE in the last 168 hours. No results for input(s): AMMONIA in the last 168 hours. Coagulation Profile: No results for input(s): INR, PROTIME in the last 168 hours. Cardiac Enzymes: No results for input(s): CKTOTAL, CKMB, CKMBINDEX, TROPONINI in the last 168 hours. BNP (last 3 results) Recent Labs    05/08/17 1243  PROBNP 254.0*   HbA1C: Recent Labs    08/25/17 0431  HGBA1C 6.9*   CBG: Recent Labs  Lab 08/25/17 1127 08/25/17 1700 08/25/17 2146 08/26/17 0733 08/26/17 1108  GLUCAP 381* 175* 254* 195* 304*   Lipid Profile: No results for input(s): CHOL, HDL, LDLCALC, TRIG, CHOLHDL, LDLDIRECT in the last 72 hours. Thyroid Function Tests: No results for input(s): TSH, T4TOTAL, FREET4, T3FREE, THYROIDAB in the last 72 hours. Anemia Panel: No results for input(s): VITAMINB12, FOLATE, FERRITIN, TIBC, IRON, RETICCTPCT in the last 72 hours. Sepsis Labs: No results for input(s): PROCALCITON, LATICACIDVEN in the last 168 hours.  Recent Results (from the past 240 hour(s))  MRSA PCR Screening     Status: None   Collection Time: 08/25/17  4:33 AM  Result Value Ref Range Status   MRSA by PCR NEGATIVE NEGATIVE Final    Comment:        The GeneXpert MRSA Assay (FDA approved for NASAL  specimens only), is one component of a comprehensive MRSA colonization surveillance program. It is not intended to diagnose MRSA infection nor to guide or monitor treatment for MRSA infections.          Radiology Studies: Dg Chest 2 View  Result Date: 08/24/2017 CLINICAL DATA:  Shortness of breath with nausea EXAM: CHEST  2 VIEW COMPARISON:  07/08/2017 FINDINGS: Mild cardiomegaly with aortic atherosclerosis. No pleural effusion. Coarse interstitial opacity likely chronic. Small focus of atelectasis or scar in the left mid lung. Negative for pneumothorax. Stable compression deformity at the thoracolumbar junction. IMPRESSION: 1. Mild cardiomegaly. Negative for edema or acute pulmonary infiltrate. Electronically Signed   By: Jasmine PangKim  Fujinaga M.D.   On: 08/24/2017 23:07   [place] Renal  Result Date: 08/25/2017 CLINICAL DATA:  Acute renal failure. EXAM: RENAL / URINARY TRACT ULTRASOUND COMPLETE COMPARISON:  None. FINDINGS: Right Kidney: Length: 10.3 cm. Echogenicity within normal limits.  No mass or hydronephrosis visualized. Left Kidney: Length: 10.8 cm. Echogenicity within normal limits. No mass or hydronephrosis visualized. Bladder: Appears normal for degree of bladder distention. IMPRESSION: Normal renal ultrasound. Electronically Signed   By: Lupita RaiderJames  Green Jr, M.D.   On: 08/25/2017 14:21        Scheduled Meds: . amLODipine  10 mg Oral Daily  . citalopram  10 mg Oral Daily  . docusate sodium  100 mg Oral BID  . donepezil  10 mg Oral QHS  . enoxaparin (LOVENOX) injection  40 mg Subcutaneous QHS  . furosemide  40 mg Oral QAC breakfast  . insulin aspart  0-20 Units Subcutaneous TID WC  . insulin aspart  0-5 Units Subcutaneous QHS  . ipratropium-albuterol  3 mL Nebulization BID  . irbesartan  300 mg Oral Daily  . mouth rinse  15 mL Mouth Rinse BID  . [START ON 08/27/2017] methylPREDNISolone (SOLU-MEDROL) injection  40 mg Intravenous Q24H   Continuous Infusions:   LOS: 2 days     Time spent: 35 minutes.     Kathlen ModyVijaya Haven Pylant, MD [facility] Pager [phone]   If 7PM-7AM, please contact night-coverage [email or web address] Password [email or web address] 08/26/2017, 5:03 PM

## 2017-08-26 NOTE — Plan of Care (Signed)
  Education: Knowledge of General Education information will improve 08/26/2017 0719 - Progressing by Herbert PunAddison, Tulsi Crossett Y, RN   Health Behavior/Discharge Planning: Ability to manage health-related needs will improve 08/26/2017 0719 - Progressing by Zafira Munos, Dwana Melenahloe Y, RN

## 2017-08-26 NOTE — Progress Notes (Signed)
*  PRELIMINARY RESULTS* Echocardiogram 2D Echocardiogram with Definity has been performed.  Leta JunglingCooper, [name] M 08/26/2017, 12:47 PM

## 2017-08-26 NOTE — Plan of Care (Signed)
  Education: Knowledge of General Education information will improve 08/26/2017 0815 - Progressing by Herbert PunAddison, Zetha Kuhar Y, RN 08/26/2017 0719 - Progressing by Herbert PunAddison, Ourania Hamler Y, RN   Health Behavior/Discharge Planning: Ability to manage health-related needs will improve 08/26/2017 0815 - Progressing by Humaira Sculley, Dwana Melenahloe Y, RN

## 2017-08-26 NOTE — Progress Notes (Signed)
Inpatient Diabetes Program Recommendations  AACE/ADA: New Consensus Statement on Inpatient Glycemic Control (2015)  Target Ranges:  Prepandial:   less than 140 mg/dL      Peak postprandial:   less than 180 mg/dL (1-2 hours)      Critically ill patients:  140 - 180 mg/dL   Lab Results  Component Value Date   GLUCAP 195 (H) 08/26/2017   HGBA1C 6.9 (H) 08/25/2017    Review of Glycemic Control  Blood sugars > goal of 140-180 mg/dL likely d/t steroids. Appears to have good control at home with HgbA1C of 6.9%.  Inpatient Diabetes Program Recommendations:    Add Novolog 3 units tidwc for meal coverage insulin while on steroids. Do not give if pt eats < 50% meal.  Continue to follow.  Thank you. Ailene Ardshonda Jaramie Bastos, RD, LDN, CDE Inpatient Diabetes Coordinator [phone]

## 2017-08-26 NOTE — Progress Notes (Signed)
Occupational Therapy Treatment Patient Details Name: Rachel Rangel Emert MRN: [id number] DOB: 08/10/1934 Today's Date: 08/26/2017    History of present illness Rachel Rangel Hollick is a 83 y.o. female with medical history significant of dementia; DM; and HTN presenting with SOB   OT comments  Pt will need A with all ADL's at ALF- including toileting  Follow Up Recommendations  Home health OT;Supervision - Intermittent    Equipment Recommendations  None recommended by OT       Precautions / Restrictions Precautions Precautions: Fall       Mobility Bed Mobility Overal bed mobility: Needs Assistance Bed Mobility: Supine to Sit     Supine to sit: Mod assist;HOB elevated        Transfers Overall transfer level: Needs assistance Equipment used: Rolling walker (2 wheeled) Transfers: Sit to/from UGI CorporationStand;Stand Pivot Transfers Sit to Stand: Min assist Stand pivot transfers: Min assist       General transfer comment: increased time    Balance Overall balance assessment: Needs assistance Sitting-balance support: Bilateral upper extremity supported;Feet supported Sitting balance-Leahy Scale: Fair     Standing balance support: During functional activity;Bilateral upper extremity supported Standing balance-Leahy Scale: Poor                             ADL either performed or assessed with clinical judgement   ADL Overall ADL's : Needs assistance/impaired Eating/Feeding: Set up;Sitting   Grooming: Sitting;Set up   Upper Body Bathing: Set up;Sitting   Lower Body Bathing: Moderate assistance;Sit to/from stand;Cueing for sequencing;Cueing for safety   Upper Body Dressing : Set up;Sitting   Lower Body Dressing: Moderate assistance;Sit to/from stand;Cueing for sequencing;Cueing for safety   Toilet Transfer: Minimal assistance;RW;Comfort height toilet;BSC   Toileting- Clothing Manipulation and Hygiene: Minimal assistance;Sit to/from stand;Cueing for  sequencing;Cueing for safety         General ADL Comments: pt overall min A with increased time with transfer. Pt will need A at ALF each time she goes to bathroom     Vision Patient Visual Report: No change from baseline            Cognition Arousal/Alertness: Awake/alert Behavior During Therapy: WFL for tasks assessed/performed Overall Cognitive Status: History of cognitive impairments - at baseline                                                     Pertinent Vitals/ Pain       Pain Assessment: No/denies pain         Frequency  Min 2X/week        Progress Toward Goals  OT Goals(current goals can now be found in the care plan section)  Progress towards OT goals: Progressing toward goals     Plan         AM-PAC PT "6 Clicks" Daily Activity     Outcome Measure   Help from another person eating meals?: None Help from another person taking care of personal grooming?: A Little Help from another person toileting, which includes using toliet, bedpan, or urinal?: A Little Help from another person bathing (including washing, rinsing, drying)?: A Little Help from another person to put on and taking off regular upper body clothing?: A Little Help from another person to put on and taking off regular  lower body clothing?: A Little 6 Click Score: 19    End of Session    OT Visit Diagnosis: Unsteadiness on feet (R26.81);Muscle weakness (generalized) (M62.81)   Activity Tolerance Patient limited by fatigue   Patient Left in chair;with call bell/phone within reach;with chair alarm set   Nurse Communication Mobility status        Time: [id number] OT Time Calculation (min): 23 min  Charges: OT General Charges $OT Visit: 1 Visit OT Treatments $Self Care/Home Management : 23-37 mins  [place] Island[place]ori Damareon Lanni, [place] [phone]   Einar CrowEDDING, Jock Mahon D 08/26/2017, 10:08 AM

## 2017-08-27 DIAGNOSIS — N179 Acute kidney failure, unspecified: Secondary | ICD-10-CM

## 2017-08-27 DIAGNOSIS — N189 Chronic kidney disease, unspecified: Secondary | ICD-10-CM

## 2017-08-27 DIAGNOSIS — I5033 Acute on chronic diastolic (congestive) heart failure: Secondary | ICD-10-CM

## 2017-08-27 DIAGNOSIS — J9601 Acute respiratory failure with hypoxia: Secondary | ICD-10-CM

## 2017-08-27 DIAGNOSIS — E119 Type 2 diabetes mellitus without complications: Secondary | ICD-10-CM

## 2017-08-27 DIAGNOSIS — I10 Essential (primary) hypertension: Secondary | ICD-10-CM

## 2017-08-27 DIAGNOSIS — F0390 Unspecified dementia without behavioral disturbance: Secondary | ICD-10-CM

## 2017-08-27 DIAGNOSIS — J441 Chronic obstructive pulmonary disease with (acute) exacerbation: Principal | ICD-10-CM

## 2017-08-27 LAB — GLUCOSE, CAPILLARY
GLUCOSE-CAPILLARY: 136 mg/dL — AB (ref 65–99)
GLUCOSE-CAPILLARY: 166 mg/dL — AB (ref 65–99)
Glucose-Capillary: 142 mg/dL — ABNORMAL HIGH (ref 65–99)
Glucose-Capillary: 290 mg/dL — ABNORMAL HIGH (ref 65–99)
Glucose-Capillary: 219 mg/dL — ABNORMAL HIGH (ref 65–99)

## 2017-08-27 LAB — CBC WITH DIFFERENTIAL/PLATELET
Basophils Absolute: 0 10*3/uL (ref 0.0–0.1)
Basophils Relative: 0 %
EOS ABS: 0 10*3/uL (ref 0.0–0.7)
EOS PCT: 0 %
HCT: 35.7 % — ABNORMAL LOW (ref 36.0–46.0)
Hemoglobin: 11.6 g/dL — ABNORMAL LOW (ref 12.0–15.0)
LYMPHS ABS: 1 10*3/uL (ref 0.7–4.0)
Lymphocytes Relative: 8 %
MCH: 30.1 pg (ref 26.0–34.0)
MCHC: 32.5 g/dL (ref 30.0–36.0)
MCV: 92.5 fL (ref 78.0–100.0)
MONO ABS: 0.7 10*3/uL (ref 0.1–1.0)
MONOS PCT: 5 %
Neutro Abs: 11.1 10*3/uL — ABNORMAL HIGH (ref 1.7–7.7)
Neutrophils Relative %: 87 %
PLATELETS: 267 10*3/uL (ref 150–400)
RBC: 3.86 MIL/uL — AB (ref 3.87–5.11)
RDW: 14.5 % (ref 11.5–15.5)
WBC: 12.8 10*3/uL — AB (ref 4.0–10.5)

## 2017-08-27 LAB — BASIC METABOLIC PANEL
Anion gap: 8 (ref 5–15)
BUN: 49 mg/dL — AB (ref 6–20)
CHLORIDE: 97 mmol/L — AB (ref 101–111)
CO2: 28 mmol/L (ref 22–32)
CREATININE: 1.45 mg/dL — AB (ref 0.44–1.00)
Calcium: 9.7 mg/dL (ref 8.9–10.3)
GFR calc Af Amer: 38 mL/min — ABNORMAL LOW (ref >60)
GFR calc non Af Amer: 33 mL/min — ABNORMAL LOW (ref >60)
GLUCOSE: 238 mg/dL — AB (ref 65–99)
Potassium: 4.9 mmol/L (ref 3.5–5.1)
SODIUM: 133 mmol/L — AB (ref 135–145)

## 2017-08-27 LAB — MAGNESIUM: MAGNESIUM: 2.4 mg/dL (ref 1.7–2.4)

## 2017-08-27 MED ORDER — FUROSEMIDE 40 MG PO TABS
40.0000 mg | ORAL_TABLET | Freq: Every day | ORAL | Status: DC
  Administered 2017-08-27 – 2017-08-28 (×2): 40 mg via ORAL
  Filled 2017-08-27 (×2): qty 1

## 2017-08-27 MED ORDER — PREDNISONE 20 MG PO TABS
40.0000 mg | ORAL_TABLET | Freq: Every day | ORAL | Status: DC
  Administered 2017-08-28 – 2017-08-29 (×2): 40 mg via ORAL
  Filled 2017-08-27 (×2): qty 2

## 2017-08-27 MED ORDER — ALBUTEROL SULFATE (2.5 MG/3ML) 0.083% IN NEBU: 2.5000 mg | INHALATION_SOLUTION | RESPIRATORY_TRACT | Status: DC | PRN

## 2017-08-27 NOTE — Progress Notes (Signed)
Physical Therapy  SATURATION QUALIFICATIONS: (This note is used to comply with regulatory documentation for home oxygen)  Patient Saturations on Room Air at Rest = 94%  Patient Saturations on Room Air while Ambulating 15 feet = 95%  Please briefly explain why patient needs home oxygen: patient did not require supplemental oxygen to achieve therapeutic level.    Stephens NovemberNicholas Neighbors, SPTA [facility] [phone]  Felecia ShellingLori Dagon Budai  PTA [facility] Pager      [phone]

## 2017-08-27 NOTE — Progress Notes (Signed)
Occupational Therapy Treatment Patient Details Name: Rachel Rangel MRN: [id number] DOB: 09/08/1934 Today's Date: 08/27/2017    History of present illness Rachel Rangel is a 83 y.o. female with medical history significant of dementia; DM; and HTN presenting with SOB   OT comments    Follow Up Recommendations  Home health OT;Supervision - Intermittent - MAY  NEED SNF as pt needed increased A this day   Equipment Recommendations  None recommended by OT    Recommendations for Other Services      Precautions / Restrictions Precautions Precautions: Fall       Mobility Bed Mobility Overal bed mobility: Needs Assistance Bed Mobility: Supine to Sit     Supine to sit: [facility] elevated;Max assist        Transfers Overall transfer level: Needs assistance Equipment used: Rolling walker (2 wheeled) Transfers: Sit to/from [employer]Stand;Stand Pivot Transfers Sit to Stand: Mod assist Stand pivot transfers: Mod assist       General transfer comment: increased time    Balance Overall balance assessment: Needs assistance Sitting-balance support: Bilateral upper extremity supported;Feet supported Sitting balance-Leahy Scale: Fair     Standing balance support: During functional activity;Bilateral upper extremity supported Standing balance-Leahy Scale: Poor                             ADL either performed or assessed with clinical judgement   ADL           Upper Body Bathing: Minimal assistance;Sitting   Lower Body Bathing: Maximal assistance;Cueing for safety;Cueing for sequencing   Upper Body Dressing : Minimal assistance;Sitting   Lower Body Dressing: Sit to/from stand;Cueing for safety;Maximal assistance       Toileting- Clothing Manipulation and Hygiene: Maximal assistance;Sit to/from stand         General ADL Comments: pt needed increased A this day.  May need to consider SNF if ALF not able to provide increased level of care.     Vision        Perception     Praxis      Cognition Arousal/Alertness: Awake/alert Behavior During Therapy: WFL for tasks assessed/performed Overall Cognitive Status: History of cognitive impairments - at baseline                                          Exercises     Shoulder Instructions       General Comments      Pertinent Vitals/ Pain       Pain Assessment: No/denies pain  Home Living                                          Prior Functioning/Environment              Frequency  Min 2X/week        Progress Toward Goals  OT Goals(current goals can now be found in the care plan section)  Progress towards OT goals: Not progressing toward goals - comment(pt needed increased A this day)     Plan Discharge plan remains appropriate    Co-evaluation                 AM-PAC PT "6 Clicks" Daily Activity  Outcome Measure   Help from another person eating meals?: None Help from another person taking care of personal grooming?: A Little Help from another person toileting, which includes using toliet, bedpan, or urinal?: A Little Help from another person bathing (including washing, rinsing, drying)?: A Lot Help from another person to put on and taking off regular upper body clothing?: A Little Help from another person to put on and taking off regular lower body clothing?: A Lot 6 Click Score: 17    End of Session Equipment Utilized During Treatment: Rolling walker  OT Visit Diagnosis: Unsteadiness on feet (R26.81);Muscle weakness (generalized) (M62.81)   Activity Tolerance Patient limited by fatigue   Patient Left in chair;with call bell/phone within reach;with chair alarm set   Nurse Communication Mobility status        Time: [id number] OT Time Calculation (min): 20 min  Charges: OT General Charges $OT Visit: 1 Visit OT Treatments $Self Care/Home Management : 8-22 mins  [place]ori Alissandra Geoffroy,  [place] [phone]    Einar CrowEDDING, Aneeka Bowden D 08/27/2017, 1:06 PM

## 2017-08-27 NOTE — NC FL2 (Signed)
[place] MEDICAID FL2 LEVEL OF CARE SCREENING TOOL     IDENTIFICATION  Patient Name: Rachel Rangel Birthdate: 20-Oct-1933 Sex: female Admission Date (Current Location): 08/24/2017  [facility] [place]   Facility and Address:  [facility],  [address], [place] [id number]      Provider Number: [id number]  Attending Physician Name and Address:  Rodolph Bonghompson, Daniel V, MD  Relative Name and Phone Number:       Current Level of Care: Hospital Recommended Level of Care: Assisted Living Facility Prior Approval Number:    Date Approved/Denied:   PASRR Number:    Discharge Plan: Assisted Living Facility    Current Diagnoses: Patient Active Problem List   Diagnosis Date Noted  . COPD exacerbation (HCC) 08/24/2017  . Essential hypertension 05/09/2017  . Dyspnea on exertion 05/08/2017  . Dementia without behavioral disturbance 12/12/2015  . T12 compression fracture (HCC) 09/08/2015  . UTI (lower urinary tract infection) 09/07/2015  . Community acquired pneumonia 09/07/2015  . Back pain 09/07/2015  . Fall 09/07/2015  . Diabetes mellitus type 2, controlled, without complications (HCC) 09/07/2015  . Leukocytosis 09/07/2015  . Acute respiratory failure with hypoxia (HCC) 09/07/2015  . Sepsis (HCC) 09/07/2015    Orientation RESPIRATION BLADDER Height & Weight     Self, Time, Situation, Place   Incontinent Weight: 211 lb 3.2 oz (95.8 kg) Height:  5\' 2"  (157.5 cm)  BEHAVIORAL SYMPTOMS/MOOD NEUROLOGICAL BOWEL NUTRITION STATUS      Continent Diet(CCHO-Carb Modified. )  AMBULATORY STATUS COMMUNICATION OF NEEDS Skin   Supervision Non-Verbally Normal                       Personal Care Assistance Level of Assistance  Bathing, Feeding, Dressing Bathing Assistance: Limited assistance Feeding assistance: Independent Dressing Assistance: Limited assistance     Functional Limitations Info  Sight, Hearing, Speech Sight Info:  Adequate Hearing Info: Impaired Speech Info: Adequate    SPECIAL CARE FACTORS FREQUENCY  PT (By licensed PT), OT (By licensed OT)     PT Frequency: 5X/WEEK OT Frequency: 5X/WEEK            Contractures Contractures Info: Not present    Additional Factors Info  Code Status, Allergies, Insulin Sliding Scale Code Status Info: DNR  Allergies Info: Lipitor Atorvastatin, Penicillins   Insulin Sliding Scale Info: Insulin Sliding Scale Info: Novolog 3 x a day       Current Medications (08/27/2017):  This is the current hospital active medication list Current Facility-Administered Medications  Medication Dose Route Frequency Provider Last Rate Last Dose  . acetaminophen (TYLENOL) tablet 650 mg  650 mg Oral Q6H PRN Jonah BlueYates, Jennifer, MD       Or  . acetaminophen (TYLENOL) suppository 650 mg  650 mg Rectal Q6H PRN Jonah BlueYates, Jennifer, MD      . albuterol (PROVENTIL) (2.5 MG/3ML) 0.083% nebulizer solution 2.5 mg  2.5 mg Nebulization Q2H PRN Jonah BlueYates, Jennifer, MD   2.5 mg at 08/25/17 0303  . amLODipine (NORVASC) tablet 10 mg  10 mg Oral Daily Jonah BlueYates, Jennifer, MD   10 mg at 08/27/17 0959  . citalopram (CELEXA) tablet 10 mg  10 mg Oral Daily Jonah BlueYates, Jennifer, MD   10 mg at 08/27/17 0959  . docusate sodium (COLACE) capsule 100 mg  100 mg Oral BID Jonah BlueYates, Jennifer, MD   100 mg at 08/26/17 2309  . donepezil (ARICEPT) tablet 10 mg  10 mg Oral QHS Jonah BlueYates, Jennifer, MD  10 mg at 08/26/17 2309  . enoxaparin (LOVENOX) injection 40 mg  40 mg Subcutaneous QHS Jonah BlueYates, Jennifer, MD   40 mg at 08/26/17 2310  . guaiFENesin-dextromethorphan (ROBITUSSIN DM) 100-10 MG/5ML syrup 10 mL  10 mL Oral Q4H PRN Jonah BlueYates, Jennifer, MD   10 mL at 08/25/17 0208  . insulin aspart (novoLOG) injection 0-20 Units  0-20 Units Subcutaneous TID WC Kathlen ModyAkula, Vijaya, MD   3 Units at 08/27/17 0759  . insulin aspart (novoLOG) injection 0-5 Units  0-5 Units Subcutaneous QHS Jonah BlueYates, Jennifer, MD   3 Units at 08/25/17 2228  . insulin aspart  (novoLOG) injection 3 Units  3 Units Subcutaneous TID WC Kathlen ModyAkula, Vijaya, MD   3 Units at 08/27/17 0759  . ipratropium-albuterol (DUONEB) 0.5-2.5 (3) MG/3ML nebulizer solution 3 mL  3 mL Nebulization BID Kathlen ModyAkula, Vijaya, MD   3 mL at 08/27/17 0746  . irbesartan (AVAPRO) tablet 300 mg  300 mg Oral Daily Jonah BlueYates, Jennifer, MD   300 mg at 08/27/17 0959  . MEDLINE mouth rinse  15 mL Mouth Rinse BID Jonah BlueYates, Jennifer, MD   15 mL at 08/26/17 0956  . methylPREDNISolone sodium succinate (SOLU-MEDROL) 40 mg/mL injection 40 mg  40 mg Intravenous Q24H Kathlen ModyAkula, Vijaya, MD   40 mg at 08/27/17 0800  . ondansetron (ZOFRAN) tablet 4 mg  4 mg Oral Q6H PRN Jonah BlueYates, Jennifer, MD       Or  . ondansetron [facility](ZOFRAN) injection 4 mg  4 mg Intravenous Q6H PRN Jonah BlueYates, Jennifer, MD         Discharge Medications: Please see discharge summary for a list of discharge medications.  Relevant Imaging Results:  Relevant Lab Results:   Additional Information SSN: [id number]  Clearance CootsNicole A Lyndsi Altic, LCSW

## 2017-08-27 NOTE — Progress Notes (Signed)
Received call from pt's daughter, Lynden AngCathy, re: pt condition. Pt gave verbal consent for staff to update her on pt status. Spoke with pt's daughter, Lynden AngCathy, after verifying pt information. Updated her on pt's status and current plan for discharge. She verbalized understanding and agreed to plan. Answered questions to her satisfaction.

## 2017-08-27 NOTE — Progress Notes (Signed)
PROGRESS NOTE    Rachel Rangel  QIO:[id number] DOB: 1934-06-17 DOA: 08/24/2017 PCP: Tally JoeSwayne, David, MD    Brief Narrative:  Rachel DunkerMartha J Sharpeis a 83 y.o.femalewith medical history significant ofdementia; DM; and HTN presenting with SOB, admitted for acute respiratory failure secondary to possibly COPD exacerbation with a component of CHF.      Assessment & Plan:   Principal Problem:   Acute respiratory failure with hypoxia (HCC) Active Problems:   COPD exacerbation (HCC)   Diabetes mellitus type 2, controlled, without complications (HCC)   Dementia without behavioral disturbance   Essential hypertension  #1 acute respiratory failure with hypoxia secondary to acute COPD exacerbation in the setting of mild acute on chronic diastolic CHF Patient with clinical improvement.  Patient had presented with acute respiratory failure with hypoxia with a elevated BNP noted to be wheezing as well.  Patient placed on IV steroid taper, scheduled nebulizers.  Patient given a dose of IV Lasix yesterday with a urine output of 2 L.  Patient is -2.9 L during this hospitalization.2 D Echo with EF 60-70%, mild LVH, NWMA.  Current weight is 211 pounds from 212 pounds on 08/25/2017.  Oxygenation has improved.  Change IV steroids to oral steroids taper.  Place back on home oral Lasix of 40 mg daily.  Follow.  2.  Diabetes mellitus type 2 Hemoglobin A1c 6.9.  CBGs have ranged from 142-290.  Likely steroid-induced.  Continue sliding scale insulin and meal coverage insulin.  Follow with steroid taper.  3.  Hypertension Stable.  Continue Avapro, Norvasc.  4.  Dementia without behavioral disturbance Stable.  Continue Celexa, Aricept.  5.  Hyperkalemia Resolved.  #6 acute kidney injury Creatinine noted to be slightly elevated to 1.49.  Creatinine currently at 1.45 after good diuresis.  Change IV Lasix back to home dose oral Lasix.  Follow for now.   DVT prophylaxis: Lovenox Code Status: DNR Family  Communication: Updated patient.  No family at bedside. Disposition Plan: Likely home with home health if continued improvement over the next 24-48 hours.   Consultants:   None  Procedures:   Chest x-ray 08/24/2017  Renal ultrasound 08/25/2017  2D echo 08/26/2017  Antimicrobials:   None   Subjective: Patient laying in bed.  Patient feels better than on admission although not at baseline.  No chest pain.  Shortness of breath improving.  Objective: Vitals:   08/27/17 0730 08/27/17 0931 08/27/17 1400 08/27/17 1431  BP:   (!) 135/46 (!) 145/57  Pulse:   (!) 56 (!) 55  Resp:   18   Temp:   98.2 F (36.8 C)   TempSrc:   Oral   SpO2: 98%  95% 94%  Weight:  95.8 kg (211 lb 3.2 oz)    Height:        Intake/Output Summary (Last 24 hours) at 08/27/2017 1949 Last data filed at 08/27/2017 1800 Gross per 24 hour  Intake 358 ml  Output 1900 ml  Net -1542 ml   Filed Weights   08/24/17 2208 08/25/17 0123 08/27/17 0931  Weight: 90.7 kg (200 lb) 96.6 kg (212 lb 15.4 oz) 95.8 kg (211 lb 3.2 oz)    Examination:  General exam: Appears calm and comfortable  Respiratory system: Minimal expiratory wheezing throughout lung fields.  No crackles.  No rhonchi.  Respiratory effort normal. Cardiovascular system: S1 & S2 heard, RRR. No JVD, murmurs, rubs, gallops or clicks. No pedal edema. Gastrointestinal system: Abdomen is nondistended, soft and nontender. No organomegaly or masses  felt. Normal bowel sounds heard. Central nervous system: Alert and oriented. No focal neurological deficits. Extremities: Symmetric 5 x 5 power. Skin: No rashes, lesions or ulcers Psychiatry: Judgement and insight appear normal. Mood & affect appropriate.     Data Reviewed: I have personally reviewed following labs and imaging studies  CBC: Recent Labs  Lab 08/24/17 2220 08/25/17 0431 08/27/17 0941  WBC 10.2 11.6* 12.8*  NEUTROABS 5.2  --  11.1*  HGB 12.5 11.5* 11.6*  HCT 37.9 36.4 35.7*  MCV  91.3 93.3 92.5  PLT 270 260 267   Basic Metabolic Panel: Recent Labs  Lab 08/24/17 2220 08/25/17 0431 08/26/17 1137 08/27/17 0941  NA 136 135 132* 133*  K 3.6 4.0 5.5* 4.9  CL 101 101 97* 97*  CO2 24 20* 26 28  GLUCOSE 182* 390* 295* 238*  BUN 25* 31* 41* 49*  CREATININE 1.10* 1.49* 1.33* 1.45*  CALCIUM 9.6 9.4 10.3 9.7  MG  --   --   --  2.4   GFR: Estimated Creatinine Clearance: 32.9 mL/min (A) (by C-G formula based on SCr of 1.45 mg/dL (H)). Liver Function Tests: No results for input(s): AST, ALT, ALKPHOS, BILITOT, PROT, ALBUMIN in the last 168 hours. No results for input(s): LIPASE, AMYLASE in the last 168 hours. No results for input(s): AMMONIA in the last 168 hours. Coagulation Profile: No results for input(s): INR, PROTIME in the last 168 hours. Cardiac Enzymes: No results for input(s): CKTOTAL, CKMB, CKMBINDEX, TROPONINI in the last 168 hours. BNP (last 3 results) Recent Labs    05/08/17 1243  PROBNP 254.0*   HbA1C: Recent Labs    08/25/17 0431  HGBA1C 6.9*   CBG: Recent Labs  Lab 08/26/17 1702 08/26/17 2155 08/27/17 0737 08/27/17 1205 08/27/17 1609  GLUCAP 205* 179* 142* 290* 219*   Lipid Profile: No results for input(s): CHOL, HDL, LDLCALC, TRIG, CHOLHDL, LDLDIRECT in the last 72 hours. Thyroid Function Tests: No results for input(s): TSH, T4TOTAL, FREET4, T3FREE, THYROIDAB in the last 72 hours. Anemia Panel: No results for input(s): VITAMINB12, FOLATE, FERRITIN, TIBC, IRON, RETICCTPCT in the last 72 hours. Sepsis Labs: No results for input(s): PROCALCITON, LATICACIDVEN in the last 168 hours.  Recent Results (from the past 240 hour(s))  MRSA PCR Screening     Status: None   Collection Time: 08/25/17  4:33 AM  Result Value Ref Range Status   MRSA by PCR NEGATIVE NEGATIVE Final    Comment:        The GeneXpert MRSA Assay (FDA approved for NASAL specimens only), is one component of a comprehensive MRSA colonization surveillance program. It  is not intended to diagnose MRSA infection nor to guide or monitor treatment for MRSA infections.          Radiology Studies: No results found.      Scheduled Meds: . amLODipine  10 mg Oral Daily  . citalopram  10 mg Oral Daily  . docusate sodium  100 mg Oral BID  . donepezil  10 mg Oral QHS  . enoxaparin (LOVENOX) injection  40 mg Subcutaneous QHS  . furosemide  40 mg Oral Daily  . insulin aspart  0-20 Units Subcutaneous TID WC  . insulin aspart  0-5 Units Subcutaneous QHS  . insulin aspart  3 Units Subcutaneous TID WC  . ipratropium-albuterol  3 mL Nebulization BID  . irbesartan  300 mg Oral Daily  . mouth rinse  15 mL Mouth Rinse BID  . methylPREDNISolone (SOLU-MEDROL) injection  40 mg  Intravenous Q24H   Continuous Infusions:   LOS: 3 days    Time spent: 35 minutes    Ramiro Harvest, MD [facility] Pager [phone]  If 7PM-7AM, please contact night-coverage [email or web address] Password [facility] 08/27/2017, 7:49 PM

## 2017-08-27 NOTE — Care Management Important Message (Signed)
Important Message  Patient Details  Name: Rachel Rangel MRN: [id number] Date of Birth: 03-13-1934   Medicare Important Message Given:  Yes    Caren MacadamFuller, [place] 08/27/2017, 10:12 AMImportant Message  Patient Details  Name: Rachel Rangel MRN: [id number] Date of Birth: 03-13-1934   Medicare Important Message Given:  Yes    Caren MacadamFuller, Jeanmarc Viernes 08/27/2017, 10:12 AM

## 2017-08-27 NOTE — Progress Notes (Signed)
Patient is requiring 2L O2. The patient was not on oxygen prior to this admission per patient and ALF([facility]) nursing staff.  CSW inform RNCM patient will need a concentrator before returning. Patient is active with [facility] at ALF.  CSW will continue to assist with patient needs.   Vivi BarrackNicole Valmai Vandenberghe, Theresia MajorsLCSWA, MSW Clinical Social Worker  [phone] 08/27/2017  10:49 AM

## 2017-08-27 NOTE — Progress Notes (Signed)
Physical Therapy Treatment Patient Details Name: Rachel ProvidenceMartha J Nathaniel MRN: [id number] DOB: 1934/09/30 Today's Date: 08/27/2017    History of Present Illness Rachel Rangel is a 83 y.o. female with medical history significant of dementia; DM; and HTN presenting with SOB    PT Comments    SATURATION QUALIFICATIONS: (This note is used to comply with regulatory documentation for home oxygen)  Patient Saturations on Room Air at Rest = 94%  Patient Saturations on Room Air while Ambulating 15 feet = 95%  Please briefly explain why patient needs home oxygen: patient did not require supplemental oxygen to achieve therapeutic level.   Patient required mod assist to move to EOB with HOB elevated. Patient reported throughout session need to urinate but was wearing adults briefs, due to overactive bladder. Patient ambulated on RA for 15 feet. Patient required short rest break due to reporting fatigue but therapist noted no increase/decrease in vitals.      Follow Up Recommendations  Home health PT     Equipment Recommendations  None recommended by PT    Recommendations for Other Services       Precautions / Restrictions Precautions Precautions: Fall Restrictions Other Position/Activity Restrictions: Hard of hearing    Mobility  Bed Mobility Overal bed mobility: Needs Assistance Bed Mobility: Supine to Sit     Supine to sit: HOB elevated;Mod assist        Transfers Overall transfer level: Needs assistance Equipment used: Rolling walker (2 wheeled) Transfers: Sit to/from Stand Sit to Stand: Mod assist Stand pivot transfers: Mod assist       General transfer comment: required increased time   Ambulation/Gait Ambulation/Gait assistance: Mod assist Ambulation Distance (Feet): 15 Feet Assistive device: Rolling walker (2 wheeled) Gait Pattern/deviations: Step-to pattern Gait velocity: decreased Gait velocity interpretation: Below normal speed for age/gender General  Gait Details: patient stated she is "used to walking with a RW at her ALF", patient required 1 rest break during ambulation. O2 sat monitored on RA during ambulation   Stairs            Wheelchair Mobility    Modified Rankin (Stroke Patients Only)       Balance Overall balance assessment: Needs assistance Sitting-balance support: Bilateral upper extremity supported;Feet supported Sitting balance-Leahy Scale: Fair     Standing balance support: During functional activity;Bilateral upper extremity supported Standing balance-Leahy Scale: Poor                              Cognition Arousal/Alertness: Awake/alert Behavior During Therapy: WFL for tasks assessed/performed Overall Cognitive Status: Within Functional Limits for tasks assessed                                        Exercises      General Comments        Pertinent Vitals/Pain Pain Assessment: No/denies pain    Home Living                      Prior Function            PT Goals (current goals can now be found in the care plan section)      Frequency    Min 2X/week      PT Plan      Co-evaluation  AM-PAC PT "6 Clicks" Daily Activity  Outcome Measure  Difficulty turning over in bed (including adjusting bedclothes, sheets and blankets)?: A Lot Difficulty moving from lying on back to sitting on the side of the bed? : A Lot Difficulty sitting down on and standing up from a chair with arms (e.g., wheelchair, bedside commode, etc,.)?: A Lot Help needed moving to and from a bed to chair (including a wheelchair)?: A Lot Help needed walking in hospital room?: A Lot Help needed climbing 3-5 steps with a railing? : A Lot 6 Click Score: 12    End of Session Equipment Utilized During Treatment: Gait belt Activity Tolerance: Patient tolerated treatment well Patient left: with call bell/phone within reach;in bed;with bed alarm set Nurse  Communication: Mobility status PT Visit Diagnosis: Difficulty in walking, not elsewhere classified (R26.2)     Time: 1405-1430 PT Time Calculation (min) (ACUTE ONLY): 25 min  Charges:  $Gait Training: 8-22 mins $Therapeutic Activity: 8-22 mins                    G Codes:       Stephens NovemberNicholas Neighbors, SPTA [facility] [phone]  Felecia ShellingLori Maahi Lannan  PTA [facility]  Ac[facility]  Rehab Pager      [phone]

## 2017-08-28 LAB — GLUCOSE, CAPILLARY
GLUCOSE-CAPILLARY: 214 mg/dL — AB (ref 65–99)
GLUCOSE-CAPILLARY: 160 mg/dL — AB (ref 65–99)
Glucose-Capillary: 174 mg/dL — ABNORMAL HIGH (ref 65–99)
Glucose-Capillary: 195 mg/dL — ABNORMAL HIGH (ref 65–99)

## 2017-08-28 LAB — CBC WITH DIFFERENTIAL/PLATELET
BASOS PCT: 0 %
Basophils Absolute: 0 10*3/uL (ref 0.0–0.1)
EOS ABS: 0 10*3/uL (ref 0.0–0.7)
EOS PCT: 0 %
HCT: 38.8 % (ref 36.0–46.0)
HEMOGLOBIN: 12.6 g/dL (ref 12.0–15.0)
Lymphocytes Relative: 23 %
Lymphs Abs: 2.7 10*3/uL (ref 0.7–4.0)
MCH: 29.6 pg (ref 26.0–34.0)
MCHC: 32.5 g/dL (ref 30.0–36.0)
MCV: 91.3 fL (ref 78.0–100.0)
MONO ABS: 1.2 10*3/uL — AB (ref 0.1–1.0)
MONOS PCT: 10 %
NEUTROS PCT: 67 %
Neutro Abs: 8.2 10*3/uL — ABNORMAL HIGH (ref 1.7–7.7)
PLATELETS: 289 10*3/uL (ref 150–400)
RBC: 4.25 MIL/uL (ref 3.87–5.11)
RDW: 14.1 % (ref 11.5–15.5)
WBC: 12.1 10*3/uL — ABNORMAL HIGH (ref 4.0–10.5)

## 2017-08-28 LAB — BASIC METABOLIC PANEL
ANION GAP: 8 (ref 5–15)
BUN: 45 mg/dL — ABNORMAL HIGH (ref 6–20)
CALCIUM: 10 mg/dL (ref 8.9–10.3)
CO2: 30 mmol/L (ref 22–32)
Chloride: 98 mmol/L — ABNORMAL LOW (ref 101–111)
Creatinine, Ser: 1.12 mg/dL — ABNORMAL HIGH (ref 0.44–1.00)
GFR calc Af Amer: 52 mL/min — ABNORMAL LOW (ref >60)
GFR calc non Af Amer: 45 mL/min — ABNORMAL LOW (ref >60)
GLUCOSE: 135 mg/dL — AB (ref 65–99)
Potassium: 4.6 mmol/L (ref 3.5–5.1)
Sodium: 136 mmol/L (ref 135–145)

## 2017-08-28 MED ORDER — FUROSEMIDE 10 MG/ML IJ SOLN
20.0000 mg | Freq: Once | INTRAMUSCULAR | Status: AC
  Administered 2017-08-28: 20 mg via INTRAVENOUS
  Filled 2017-08-28: qty 2

## 2017-08-28 NOTE — Progress Notes (Signed)
PROGRESS NOTE    Diamantina ProvidenceMartha J Rangel  RUE:[id number] DOB: 1934/04/02 DOA: 08/24/2017 PCP: Patient, No Pcp Per    Brief Narrative:  Rachel DunkerMartha J Sharpeis a 83 y.o.femalewith medical history significant ofdementia; DM; and HTN presenting with SOB, admitted for acute respiratory failure secondary to possibly COPD exacerbation with a component of CHF.      Assessment & Plan:   Principal Problem:   Acute respiratory failure with hypoxia (HCC) Active Problems:   COPD exacerbation (HCC)   Diabetes mellitus type 2, controlled, without complications (HCC)   Dementia without behavioral disturbance   Essential hypertension   Acute renal failure (ARF) (HCC)   Acute on chronic diastolic CHF (congestive heart failure) (HCC)  #1 acute respiratory failure with hypoxia secondary to acute COPD exacerbation in the setting of mild acute on chronic diastolic CHF Patient with clinical improvement.  Patient had presented with acute respiratory failure with hypoxia with a elevated BNP noted to be wheezing as well.  Patient placed on IV steroid taper, scheduled nebulizers.  Patient given a dose of IV Lasix 08/26/2017 with a urine output of 2 L.  Patient is -2.1 L over the past 24 hours and is -4.372 L during this hospitalization. 2 D Echo with EF 60-70%, mild LVH, NWMA.  Current weight is 208 pounds from 211 pounds from 212 pounds on 08/25/2017.  Oxygenation has improved.  We will give a dose of Lasix 20 mg IV x1.  Continue oral Lasix 40 mg daily, steroid taper. Follow.  2.  Diabetes mellitus type 2 Hemoglobin A1c 6.9.  CBGs have ranged from 176-214.  Likely steroid-induced.  Continue sliding scale insulin and meal coverage insulin.  Follow with steroid taper.  3.  Hypertension Continue Avapro, Norvasc.  4.  Dementia without behavioral disturbance Stable.  Continue Celexa, Aricept.  5.  Hyperkalemia Resolved.  #6 acute kidney injury Creatinine noted to be slightly elevated to 1.49.  Creatinine  currently at 1.12 after good diuresis. Continue IV lasix. Follow for now.   DVT prophylaxis: Lovenox Code Status: DNR Family Communication: Updated patient.  No family at bedside. Disposition Plan: Likely SNF if continued improvement over the next 24-48 hours.   Consultants:   None  Procedures:   Chest x-ray 08/24/2017  Renal ultrasound 08/25/2017  2D echo 08/26/2017  Antimicrobials:   None   Subjective: Patient sitting in chair. SOB improving. No CP.  Objective: Vitals:   08/28/17 1022 08/28/17 1056 08/28/17 1149 08/28/17 1331  BP: (!) 141/53     Pulse: (!) 52     Resp:      Temp:      TempSrc:      SpO2: (!) 88% (!) 89% 90% 90%  Weight:      Height:        Intake/Output Summary (Last 24 hours) at 08/28/2017 1345 Last data filed at 08/28/2017 1300 Gross per 24 hour  Intake 540 ml  Output 2700 ml  Net -2160 ml   Filed Weights   08/25/17 0123 08/27/17 0931 08/28/17 0500  Weight: 96.6 kg (212 lb 15.4 oz) 95.8 kg (211 lb 3.2 oz) 94.8 kg (208 lb 15.9 oz)    Examination:  General exam: Appears calm and comfortable  Respiratory system: Minimal expiratory wheezing throughout lung fields. Some bibasilar crackles.  No rhonchi.  Respiratory effort normal. Cardiovascular system: RRR. No JVD, murmurs, rubs, gallops or clicks. No pedal edema. Gastrointestinal system: Abdomen is soft/ND/NTTP. No organomegaly or masses felt. Normal bowel sounds heard. Central nervous system: Alert  and oriented. No focal neurological deficits. Extremities: Symmetric 5 x 5 power. Skin: No rashes, lesions or ulcers Psychiatry: Judgement and insight appear normal. Mood & affect appropriate.     Data Reviewed: I have personally reviewed following labs and imaging studies  CBC: Recent Labs  Lab 08/24/17 2220 08/25/17 0431 08/27/17 0941 08/28/17 0518  WBC 10.2 11.6* 12.8* 12.1*  NEUTROABS 5.2  --  11.1* 8.2*  HGB 12.5 11.5* 11.6* 12.6  HCT 37.9 36.4 35.7* 38.8  MCV 91.3 93.3  92.5 91.3  PLT 270 260 267 289   Basic Metabolic Panel: Recent Labs  Lab 08/24/17 2220 08/25/17 0431 08/26/17 1137 08/27/17 0941 08/28/17 0518  NA 136 135 132* 133* 136  K 3.6 4.0 5.5* 4.9 4.6  CL 101 101 97* 97* 98*  CO2 24 20* 26 28 30   GLUCOSE 182* 390* 295* 238* 135*  BUN 25* 31* 41* 49* 45*  CREATININE 1.10* 1.49* 1.33* 1.45* 1.12*  CALCIUM 9.6 9.4 10.3 9.7 10.0  MG  --   --   --  2.4  --    GFR: Estimated Creatinine Clearance: 42.3 mL/min (A) (by C-G formula based on SCr of 1.12 mg/dL (H)). Liver Function Tests: No results for input(s): AST, ALT, ALKPHOS, BILITOT, PROT, ALBUMIN in the last 168 hours. No results for input(s): LIPASE, AMYLASE in the last 168 hours. No results for input(s): AMMONIA in the last 168 hours. Coagulation Profile: No results for input(s): INR, PROTIME in the last 168 hours. Cardiac Enzymes: No results for input(s): CKTOTAL, CKMB, CKMBINDEX, TROPONINI in the last 168 hours. BNP (last 3 results) Recent Labs    05/08/17 1243  PROBNP 254.0*   HbA1C: No results for input(s): HGBA1C in the last 72 hours. CBG: Recent Labs  Lab 08/27/17 1609 08/27/17 2022 08/27/17 2048 08/28/17 0736 08/28/17 1128  GLUCAP 219* 136* 166* 174* 195*   Lipid Profile: No results for input(s): CHOL, HDL, LDLCALC, TRIG, CHOLHDL, LDLDIRECT in the last 72 hours. Thyroid Function Tests: No results for input(s): TSH, T4TOTAL, FREET4, T3FREE, THYROIDAB in the last 72 hours. Anemia Panel: No results for input(s): VITAMINB12, FOLATE, FERRITIN, TIBC, IRON, RETICCTPCT in the last 72 hours. Sepsis Labs: No results for input(s): PROCALCITON, LATICACIDVEN in the last 168 hours.  Recent Results (from the past 240 hour(s))  MRSA PCR Screening     Status: None   Collection Time: 08/25/17  4:33 AM  Result Value Ref Range Status   MRSA by PCR NEGATIVE NEGATIVE Final    Comment:        The GeneXpert MRSA Assay (FDA approved for NASAL specimens only), is one component of  a comprehensive MRSA colonization surveillance program. It is not intended to diagnose MRSA infection nor to guide or monitor treatment for MRSA infections.          Radiology Studies: No results found.      Scheduled Meds: . amLODipine  10 mg Oral Daily  . citalopram  10 mg Oral Daily  . docusate sodium  100 mg Oral BID  . donepezil  10 mg Oral QHS  . enoxaparin (LOVENOX) injection  40 mg Subcutaneous QHS  . furosemide  20 mg Intravenous Once  . furosemide  40 mg Oral Daily  . insulin aspart  0-20 Units Subcutaneous TID WC  . insulin aspart  0-5 Units Subcutaneous QHS  . insulin aspart  3 Units Subcutaneous TID WC  . irbesartan  300 mg Oral Daily  . mouth rinse  15 mL Mouth  Rinse BID  . predniSONE  40 mg Oral QAC breakfast   Continuous Infusions:   LOS: 4 days    Time spent: 35 minutes    Ramiro Harvestaniel Thompson, MD [facility] Pager [phone]  If 7PM-7AM, please contact night-coverage [email or web address] Password [email or web address] 08/28/2017, 1:45 PM

## 2017-08-28 NOTE — Progress Notes (Signed)
Occupational Therapy Treatment Patient Details Name: Rachel Rangel MRN: [id number] DOB: 1934-02-15 Today's Date: 08/28/2017    History of present illness Rachel Rangel is a 83 y.o. female with medical history significant of dementia; DM; and HTN presenting with SOB   OT comments  Pt will  need increased A at ALF   Follow Up Recommendations   24/7 A at ALF with A with all ADL activity or SNF  Equipment Recommendations  None recommended by OT    Recommendations for Other Services      Precautions / Restrictions Precautions Precautions: Fall Restrictions Other Position/Activity Restrictions: Hard of hearing       Mobility Bed Mobility Overal bed mobility: Needs Assistance Bed Mobility: Supine to Sit     Supine to sit: HOB elevated;Mod assist        Transfers Overall transfer level: Needs assistance Equipment used: Rolling walker (2 wheeled) Transfers: Sit to/from UGI CorporationStand;Stand Pivot Transfers Sit to Stand: Mod assist Stand pivot transfers: Mod assist       General transfer comment: required increased time and VC    Balance Overall balance assessment: Needs assistance Sitting-balance support: Bilateral upper extremity supported;Feet supported Sitting balance-Leahy Scale: Fair     Standing balance support: During functional activity;Bilateral upper extremity supported Standing balance-Leahy Scale: Poor                             ADL either performed or assessed with clinical judgement   ADL   Eating/Feeding: Set up;Sitting   Grooming: Sitting;Set up                   Toilet Transfer: Moderate assistance;RW Toilet Transfer Details (indicate cue type and reason): bed to chair Toileting- Clothing Manipulation and Hygiene: Sit to/from stand;Total assistance Toileting - Clothing Manipulation Details (indicate cue type and reason): pt had BM and was unaware,  Pt total A for hygiene.  Pt incontinent of urine in standing.                        Cognition Arousal/Alertness: Awake/alert Behavior During Therapy: WFL for tasks assessed/performed Overall Cognitive Status: Within Functional Limits for tasks assessed                                           Prior Functioning/Environment              Frequency  Min 2X/week        Progress Toward Goals  OT Goals(current goals can now be found in the care plan section)  Progress towards OT goals: OT to reassess next treatment     Plan Discharge plan remains appropriate    Co-evaluation                 AM-PAC PT "6 Clicks" Daily Activity     Outcome Measure   Help from another person eating meals?: None Help from another person taking care of personal grooming?: A Little Help from another person toileting, which includes using toliet, bedpan, or urinal?: Total Help from another person bathing (including washing, rinsing, drying)?: A Lot Help from another person to put on and taking off regular upper body clothing?: A Little Help from another person to put on and taking off regular lower body clothing?: A Lot 6 Click Score: 15  End of Session Equipment Utilized During Treatment: Rolling walker  OT Visit Diagnosis: Unsteadiness on feet (R26.81);Muscle weakness (generalized) (M62.81)   Activity Tolerance Patient limited by fatigue   Patient Left in chair;with call bell/phone within reach;with chair alarm set   Nurse Communication Mobility status        Time: [id number] OT Time Calculation (min): 24 min  Charges: OT General Charges $OT Visit: 1 Visit OT Treatments $Self Care/Home Management : 23-37 mins  [place] Creek[place]ori Dorn [place], [place] [phone]   Alba CoryREDDING, Shaima Sardinas D 08/28/2017, 1:54 PM

## 2017-08-29 DIAGNOSIS — N390 Urinary tract infection, site not specified: Secondary | ICD-10-CM | POA: Clinically undetermined

## 2017-08-29 LAB — BASIC METABOLIC PANEL
Anion gap: 10 (ref 5–15)
BUN: 40 mg/dL — AB (ref 6–20)
CALCIUM: 9.7 mg/dL (ref 8.9–10.3)
CO2: 29 mmol/L (ref 22–32)
CREATININE: 1.06 mg/dL — AB (ref 0.44–1.00)
Chloride: 95 mmol/L — ABNORMAL LOW (ref 101–111)
GFR, EST AFRICAN AMERICAN: 56 mL/min — AB (ref >60)
GFR, EST NON AFRICAN AMERICAN: 48 mL/min — AB (ref >60)
Glucose, Bld: 142 mg/dL — ABNORMAL HIGH (ref 65–99)
Potassium: 4.1 mmol/L (ref 3.5–5.1)
SODIUM: 134 mmol/L — AB (ref 135–145)

## 2017-08-29 LAB — CBC WITH DIFFERENTIAL/PLATELET
BASOS PCT: 0 %
Basophils Absolute: 0 10*3/uL (ref 0.0–0.1)
EOS ABS: 0.1 10*3/uL (ref 0.0–0.7)
EOS PCT: 1 %
HCT: 42.4 % (ref 36.0–46.0)
Hemoglobin: 14.5 g/dL (ref 12.0–15.0)
LYMPHS ABS: 2.5 10*3/uL (ref 0.7–4.0)
Lymphocytes Relative: 25 %
MCH: 30.6 pg (ref 26.0–34.0)
MCHC: 34.2 g/dL (ref 30.0–36.0)
MCV: 89.5 fL (ref 78.0–100.0)
MONOS PCT: 8 %
Monocytes Absolute: 0.8 10*3/uL (ref 0.1–1.0)
NEUTROS PCT: 66 %
Neutro Abs: 6.5 10*3/uL (ref 1.7–7.7)
Platelets: 269 10*3/uL (ref 150–400)
RBC: 4.74 MIL/uL (ref 3.87–5.11)
RDW: 13.8 % (ref 11.5–15.5)
WBC: 9.9 10*3/uL (ref 4.0–10.5)

## 2017-08-29 LAB — GLUCOSE, CAPILLARY
GLUCOSE-CAPILLARY: 235 mg/dL — AB (ref 65–99)
GLUCOSE-CAPILLARY: 154 mg/dL — AB (ref 65–99)
GLUCOSE-CAPILLARY: 125 mg/dL — AB (ref 65–99)
Glucose-Capillary: 223 mg/dL — ABNORMAL HIGH (ref 65–99)

## 2017-08-29 MED ORDER — FUROSEMIDE 40 MG PO TABS
40.0000 mg | ORAL_TABLET | Freq: Every day | ORAL | Status: DC
  Administered 2017-08-30: 40 mg via ORAL
  Filled 2017-08-29: qty 1

## 2017-08-29 MED ORDER — FUROSEMIDE 10 MG/ML IJ SOLN
20.0000 mg | Freq: Once | INTRAMUSCULAR | Status: AC
  Administered 2017-08-29: 20 mg via INTRAVENOUS
  Filled 2017-08-29: qty 2

## 2017-08-29 MED ORDER — NITROFURANTOIN MONOHYD MACRO 100 MG PO CAPS
100.0000 mg | ORAL_CAPSULE | Freq: Two times a day (BID) | ORAL | Status: DC
  Administered 2017-08-29 – 2017-08-30 (×3): 100 mg via ORAL
  Filled 2017-08-29 (×3): qty 1

## 2017-08-29 MED ORDER — PREDNISONE 20 MG PO TABS
20.0000 mg | ORAL_TABLET | Freq: Every day | ORAL | Status: AC
  Administered 2017-08-30: 20 mg via ORAL
  Filled 2017-08-29: qty 1

## 2017-08-29 NOTE — Progress Notes (Addendum)
PROGRESS NOTE    Rachel Rangel  ZOX:[id number] DOB: [birth year] DOA: 08/24/2017 PCP: Patient, No Pcp Per    Brief Narrative:  Rachel Rangel a 83 y.o.femalewith medical history significant ofdementia; DM; and HTN presenting with SOB, admitted for acute respiratory failure secondary to possibly COPD exacerbation with a component of CHF.      Assessment & Plan:   Principal Problem:   Acute respiratory failure with hypoxia (HCC) Active Problems:   COPD exacerbation (HCC)   Diabetes mellitus type 2, controlled, without complications (HCC)   Dementia without behavioral disturbance   Essential hypertension   Acute renal failure (ARF) (HCC)   Acute on chronic diastolic CHF (congestive heart failure) (HCC)   Acute lower UTI  #1 acute respiratory failure with hypoxia secondary to acute COPD exacerbation in the setting of mild acute on chronic diastolic CHF Patient with clinical improvement.  Patient had presented with acute respiratory failure with hypoxia with a elevated BNP noted to be wheezing as well.  Patient placed on IV steroid taper, scheduled nebulizers.  Patient given a dose of IV Lasix 08/26/2017 with a urine output of 2 L.  Patient is - 3.3 L over the past 24 hours and is - 5.752 L during this hospitalization. 2 D Echo with EF 60-70%, mild LVH, NWMA.  Current weight is 205 pounds from 208 pounds from 211 pounds from 212 pounds on 08/25/2017.  Oxygenation has improved.  We will give a dose of Lasix 20 mg IV x2.  Continue oral Lasix 40 mg daily tomorrow, steroid taper. Follow.  2.  Diabetes mellitus type 2 Hemoglobin A1c 6.9.  CBGs have ranged from 154-223.  Likely steroid-induced.  Continue sliding scale insulin and meal coverage insulin.  Follow with steroid taper.  3.  Hypertension Continue Avapro, Norvasc.  4.  Dementia without behavioral disturbance Stable.  Continue Celexa, Aricept.  5.  Hyperkalemia Resolved.  #6 acute kidney injury Creatinine noted to be  slightly elevated to 1.49.  Creatinine currently at 1.06 from 1.12 after good diuresis. Continue IV lasix x2 doses today.  Transition to oral Lasix tomorrow.  Follow for now.  7.  E. coli UTI Patient with some complaints of dysuria.  Urine cultures with 90,000 colonies of E. coli.  Sensitivities pending.  Start Macrobid.   DVT prophylaxis: Lovenox Code Status: DNR Family Communication: Updated patient.  No family at bedside. Disposition Plan: Likely living facility with home health versus SNF if continued improvement over the next 24-48 hours.   Consultants:   None  Procedures:   Chest x-ray 08/24/2017  Renal ultrasound 08/25/2017  2D echo 08/26/2017  Antimicrobials:   None   Subjective: Patient sitting in chair.  Denies any chest pain.  States shortness of breath improving.  She with some complaints of dysuria.  Objective: Vitals:   08/28/17 1520 08/28/17 2103 08/29/17 0609 08/29/17 1100  BP:  (!) 175/73 (!) 171/64 (!) 158/62  Pulse:  (!) 50 (!) 43   Resp:  16 16   Temp:  98 F (36.7 C) 97.9 F (36.6 C)   TempSrc:  Oral Oral   SpO2: 92% 90% 93%   Weight:   93.3 kg (205 lb 11 oz)   Height:        Intake/Output Summary (Last 24 hours) at 08/29/2017 1318 Last data filed at 08/29/2017 0800 Gross per 24 hour  Intake 1440 ml  Output 2500 ml  Net -1060 ml   Filed Weights   08/27/17 0931 08/28/17 0500 08/29/17  [id number]  Weight: 95.8 kg (211 lb 3.2 oz) 94.8 kg (208 lb 15.9 oz) 93.3 kg (205 lb 11 oz)    Examination:  General exam: Appears calm and comfortable  Respiratory system: Some bibasilar crackles.  No wheezing noted.  No rhonchi.  Respiratory effort normal. Cardiovascular system: RRR. No JVD, murmurs, rubs, gallops or clicks. No pedal edema. Gastrointestinal system: Abdomen is soft/ND/NTTP. No organomegaly or masses felt. Normal bowel sounds heard. Central nervous system: Alert and oriented. No focal neurological deficits. Extremities: Symmetric 5 x 5  power. Skin: No rashes, lesions or ulcers Psychiatry: Judgement and insight appear normal. Mood & affect appropriate.     Data Reviewed: I have personally reviewed following labs and imaging studies  CBC: Recent Labs  Lab 08/24/17 2220 08/25/17 0431 08/27/17 0941 08/28/17 0518 08/29/17 0531  WBC 10.2 11.6* 12.8* 12.1* 9.9  NEUTROABS 5.2  --  11.1* 8.2* 6.5  HGB 12.5 11.5* 11.6* 12.6 14.5  HCT 37.9 36.4 35.7* 38.8 42.4  MCV 91.3 93.3 92.5 91.3 89.5  PLT 270 260 267 289 269   Basic Metabolic Panel: Recent Labs  Lab 08/25/17 0431 08/26/17 1137 08/27/17 0941 08/28/17 0518 08/29/17 0531  NA 135 132* 133* 136 134*  K 4.0 5.5* 4.9 4.6 4.1  CL 101 97* 97* 98* 95*  CO2 20* 26 28 30 29   GLUCOSE 390* 295* 238* 135* 142*  BUN 31* 41* 49* 45* 40*  CREATININE 1.49* 1.33* 1.45* 1.12* 1.06*  CALCIUM 9.4 10.3 9.7 10.0 9.7  MG  --   --  2.4  --   --    GFR: Estimated Creatinine Clearance: 44.3 mL/min (A) (by C-G formula based on SCr of 1.06 mg/dL (H)). Liver Function Tests: No results for input(s): AST, ALT, ALKPHOS, BILITOT, PROT, ALBUMIN in the last 168 hours. No results for input(s): LIPASE, AMYLASE in the last 168 hours. No results for input(s): AMMONIA in the last 168 hours. Coagulation Profile: No results for input(s): INR, PROTIME in the last 168 hours. Cardiac Enzymes: No results for input(s): CKTOTAL, CKMB, CKMBINDEX, TROPONINI in the last 168 hours. BNP (last 3 results) Recent Labs    05/08/17 1243  PROBNP 254.0*   HbA1C: No results for input(s): HGBA1C in the last 72 hours. CBG: Recent Labs  Lab 08/28/17 1128 08/28/17 1640 08/28/17 2141 08/29/17 0750 08/29/17 1156  GLUCAP 195* 214* 160* 235* 154*   Lipid Profile: No results for input(s): CHOL, HDL, LDLCALC, TRIG, CHOLHDL, LDLDIRECT in the last 72 hours. Thyroid Function Tests: No results for input(s): TSH, T4TOTAL, FREET4, T3FREE, THYROIDAB in the last 72 hours. Anemia Panel: No results for input(s):  VITAMINB12, FOLATE, FERRITIN, TIBC, IRON, RETICCTPCT in the last 72 hours. Sepsis Labs: No results for input(s): PROCALCITON, LATICACIDVEN in the last 168 hours.  Recent Results (from the past 240 hour(s))  MRSA PCR Screening     Status: None   Collection Time: 08/25/17  4:33 AM  Result Value Ref Range Status   MRSA by PCR NEGATIVE NEGATIVE Final    Comment:        The GeneXpert MRSA Assay (FDA approved for NASAL specimens only), is one component of a comprehensive MRSA colonization surveillance program. It is not intended to diagnose MRSA infection nor to guide or monitor treatment for MRSA infections.   Culture, Urine     Status: Abnormal (Preliminary result)   Collection Time: 08/26/17  6:37 PM  Result Value Ref Range Status   Specimen Description URINE, RANDOM  Final  Special Requests NONE  Final   Culture (A)  Final    90,000 COLONIES/mL ESCHERICHIA COLI SUSCEPTIBILITIES TO FOLLOW Performed at [facility], [address] [place], [place] [address]    Report Status PENDING  Incomplete         Radiology Studies: No results found.      Scheduled Meds: . amLODipine  10 mg Oral Daily  . citalopram  10 mg Oral Daily  . docusate sodium  100 mg Oral BID  . donepezil  10 mg Oral QHS  . enoxaparin (LOVENOX) injection  40 mg Subcutaneous QHS  . [START ON 08/30/2017] furosemide  40 mg Oral Daily  . insulin aspart  0-20 Units Subcutaneous TID WC  . insulin aspart  0-5 Units Subcutaneous QHS  . insulin aspart  3 Units Subcutaneous TID WC  . irbesartan  300 mg Oral Daily  . mouth rinse  15 mL Mouth Rinse BID  . predniSONE  40 mg Oral QAC breakfast   Continuous Infusions:   LOS: 5 days    Time spent: 35 minutes    Ramiro Harvestaniel Thompson, MD [facility] Pager [phone]  If 7PM-7AM, please contact night-coverage [email or web address] Password [email or web address] 08/29/2017, 1:18 PM

## 2017-08-29 NOTE — Progress Notes (Signed)
Physical Therapy Treatment Patient Details Name: Rachel ProvidenceMartha J Creer MRN: [id number] DOB: [birth year] Today's Date: 08/29/2017    History of Present Illness Rachel ProvidenceMartha J Fulcher is a 83 y.o. female with medical history significant of dementia; DM; and HTN presenting with SOB    PT Comments    [facility] then assisted with amb in hallway using RW.  Pt tolerated an increased distance.    Follow Up Recommendations  Home health PT(at [facility])     Equipment Recommendations  None recommended by PT    Recommendations for Other Services       Precautions / Restrictions Precautions Precautions: Fall Restrictions Weight Bearing Restrictions: No Other Position/Activity Restrictions: Hard of hearing    Mobility  Bed Mobility Overal bed mobility: Needs Assistance Bed Mobility: Supine to Sit     Supine to sit: HOB elevated;Mod assist     General bed mobility comments: increased time and use of bed pad due to limited functional use R UE (bad shoulder)  Transfers Overall transfer level: Needs assistance Equipment used: Rolling walker (2 wheeled) Transfers: Sit to/from [employer] CorporationStand;Stand Pivot Transfers Sit to Stand: Min assist         General transfer comment: 75% VC's on proper hand placement and turn completion prior to sit [facility]  Ambulation/Gait Ambulation/Gait assistance: Min assist Ambulation Distance (Feet): 20 Feet Assistive device: Rolling walker (2 wheeled) Gait Pattern/deviations: Step-to pattern Gait velocity: decreased   General Gait Details: pt tolerated an in increased distance.  AVG RA was 91%    Information systems managertairs            Wheelchair Mobility    Modified Rankin (Stroke Patients Only)       Balance                                            Cognition Arousal/Alertness: Awake/alert Behavior During Therapy: WFL for tasks assessed/performed Overall Cognitive Status: Within Functional Limits for tasks  assessed                                 General Comments: pleasant      Exercises      General Comments        Pertinent Vitals/Pain Pain Assessment: No/denies pain    Home Living                      Prior Function            PT Goals (current goals can now be found in the care plan section) Progress towards PT goals: Progressing toward goals    Frequency    Min 2X/week      PT Plan Current plan remains appropriate    Co-evaluation              AM-PAC PT "6 Clicks" Daily Activity  Outcome Measure    Difficulty moving from lying on back to sitting on the side of the bed? : A Lot Difficulty sitting down on and standing up from a chair with arms (e.g., wheelchair, bedside commode, etc,.)?: A Lot Help needed moving to and from a bed to chair (including a wheelchair)?: A Lot Help needed walking in hospital room?: A Lot Help needed climbing 3-5 steps with a railing? : A  Lot 6 Click Score: 10    End of Session Equipment Utilized During Treatment: Gait belt Activity Tolerance: Patient tolerated treatment well Patient left: with call bell/phone within reach;in bed;with bed alarm set Nurse Communication: Mobility status PT Visit Diagnosis: Difficulty in walking, not elsewhere classified (R26.2)     Time: 8119-14781215-1245 PT Time Calculation (min) (ACUTE ONLY): 30 min  Charges:  $Gait Training: 8-22 mins $Therapeutic Activity: 8-22 mins                    G Codes:       Felecia ShellingLori Emmani Lesueur  PTA [facility]  Acute  Rehab Pager      [phone]  Felecia ShellingLori Rhia Blatchford  PTA [facility]  Acute  Rehab Pager      [phone]

## 2017-08-30 DIAGNOSIS — N390 Urinary tract infection, site not specified: Secondary | ICD-10-CM

## 2017-08-30 DIAGNOSIS — R0902 Hypoxemia: Secondary | ICD-10-CM

## 2017-08-30 DIAGNOSIS — J9600 Acute respiratory failure, unspecified whether with hypoxia or hypercapnia: Secondary | ICD-10-CM

## 2017-08-30 LAB — BASIC METABOLIC PANEL
Anion gap: 8 (ref 5–15)
BUN: 40 mg/dL — ABNORMAL HIGH (ref 6–20)
CHLORIDE: 96 mmol/L — AB (ref 101–111)
CO2: 30 mmol/L (ref 22–32)
CREATININE: 1.13 mg/dL — AB (ref 0.44–1.00)
Calcium: 9.5 mg/dL (ref 8.9–10.3)
GFR calc non Af Amer: 44 mL/min — ABNORMAL LOW (ref >60)
GFR, EST AFRICAN AMERICAN: 51 mL/min — AB (ref >60)
Glucose, Bld: 149 mg/dL — ABNORMAL HIGH (ref 65–99)
POTASSIUM: 3.9 mmol/L (ref 3.5–5.1)
SODIUM: 134 mmol/L — AB (ref 135–145)

## 2017-08-30 LAB — CBC WITH DIFFERENTIAL/PLATELET
Basophils Absolute: 0 10*3/uL (ref 0.0–0.1)
Basophils Relative: 0 %
Eosinophils Absolute: 0.1 10*3/uL (ref 0.0–0.7)
Eosinophils Relative: 1 %
HEMATOCRIT: 41.5 % (ref 36.0–46.0)
HEMOGLOBIN: 13.7 g/dL (ref 12.0–15.0)
LYMPHS ABS: 2.6 10*3/uL (ref 0.7–4.0)
LYMPHS PCT: 25 %
MCH: 29.7 pg (ref 26.0–34.0)
MCHC: 33 g/dL (ref 30.0–36.0)
MCV: 89.8 fL (ref 78.0–100.0)
MONOS PCT: 9 %
Monocytes Absolute: 0.9 10*3/uL (ref 0.1–1.0)
NEUTROS PCT: 65 %
Neutro Abs: 6.8 10*3/uL (ref 1.7–7.7)
Platelets: 293 10*3/uL (ref 150–400)
RBC: 4.62 MIL/uL (ref 3.87–5.11)
RDW: 13.5 % (ref 11.5–15.5)
WBC: 10.5 10*3/uL (ref 4.0–10.5)

## 2017-08-30 LAB — URINE CULTURE: Culture: 90000 — AB

## 2017-08-30 LAB — GLUCOSE, CAPILLARY
Glucose-Capillary: 143 mg/dL — ABNORMAL HIGH (ref 65–99)
Glucose-Capillary: 280 mg/dL — ABNORMAL HIGH (ref 65–99)

## 2017-08-30 MED ORDER — METFORMIN HCL 1000 MG PO TABS: 1000.0000 mg | ORAL_TABLET | Freq: Two times a day (BID) | ORAL | 0 refills | Status: DC

## 2017-08-30 MED ORDER — DOCUSATE SODIUM 100 MG PO CAPS: 100.0000 mg | ORAL_CAPSULE | Freq: Two times a day (BID) | ORAL | 0 refills | Status: AC

## 2017-08-30 MED ORDER — NITROFURANTOIN MONOHYD MACRO 100 MG PO CAPS: 100.0000 mg | ORAL_CAPSULE | Freq: Two times a day (BID) | ORAL | 0 refills | Status: AC

## 2017-08-30 NOTE — NC FL2 (Signed)
[place] LEVEL OF CARE SCREENING TOOL     IDENTIFICATION  Patient Name: Rachel Rangel Birthdate: 1933-12-30 Sex: female Admission Date (Current Location): 08/24/2017  [facility] [place] Number:  Herbalist and Address:  [facility],  [place], [place]      Provider Number: [id number]  Attending Physician Name and Address:  Eugenie Filler, MD  Relative Name and Phone Number:       Current Level of Care: Hospital Recommended Level of Care: [place] [facility] Prior Approval Number:    Date Approved/Denied:   PASRR Number:    Discharge Plan: Other (Comment)(Assisted Living Facility)    Current Diagnoses: Patient Active Problem List   Diagnosis Date Noted  . Hypoxia   . Acute lower UTI 08/29/2017  . Acute renal failure (ARF) ([place])   . Acute on chronic diastolic CHF (congestive heart failure) ([place])   . COPD exacerbation ([place]) 08/24/2017  . Essential hypertension 05/09/2017  . Dyspnea on exertion 05/08/2017  . Dementia without behavioral disturbance 12/12/2015  . T12 compression fracture ([place]) 09/08/2015  . UTI (lower urinary tract infection) 09/07/2015  . Community acquired pneumonia 09/07/2015  . Back pain 09/07/2015  . Fall 09/07/2015  . Diabetes mellitus type 2, controlled, without complications ([place]) 15/83/0940  . Leukocytosis 09/07/2015  . Acute respiratory failure with hypoxia ([place]) 09/07/2015  . Sepsis ([place]) 09/07/2015    Orientation RESPIRATION BLADDER Height & Weight     Self, Time, Situation, Place  Normal Incontinent Weight: 207 lb 7.3 oz (94.1 kg) Height:  5' 2"  (157.5 cm)  BEHAVIORAL SYMPTOMS/MOOD NEUROLOGICAL BOWEL NUTRITION STATUS      Continent Diet(CCHO no added salt)  AMBULATORY STATUS COMMUNICATION OF NEEDS Skin   Limited Assistance Verbally Normal                       Personal Care Assistance Level of Assistance  Bathing, Feeding, Dressing Bathing Assistance:  Limited assistance Feeding assistance: Independent Dressing Assistance: Limited assistance     Functional Limitations Info  Sight, Hearing, Speech Sight Info: Adequate Hearing Info: Adequate Speech Info: Adequate    SPECIAL CARE FACTORS FREQUENCY  PT (By licensed PT), OT (By licensed OT)     PT Frequency: HHPT OT Frequency: HHOT            Contractures Contractures Info: Not present    Additional Factors Info  Code Status, Allergies Code Status Info: DNR Allergies Info: Lipitor Atorvastatin;Penicillins             Discharge Medications:  Medication List     STOP taking these medications   ACTOPLUS MET XR 30-1000 MG Tb24 Generic drug:  Pioglitazone HCl-Metformin HCl     TAKE these medications   amLODipine 10 MG tablet Commonly known as:  NORVASC Take 10 mg by mouth daily.   APAP 500 PO Take 500 mg by mouth every 6 (six) hours as needed (pain).   BIOFREEZE 4 % Gel Generic drug:  Menthol (Topical Analgesic) Apply 1 application topically 2 (two) times daily as needed (upper arm/shoulder pain).   citalopram 10 MG tablet Commonly known as:  CELEXA Take 1 tablet (10 mg total) by mouth daily.   docusate sodium 100 MG capsule Commonly known as:  COLACE Take 1 capsule (100 mg total) by mouth 2 (two) times daily.   donepezil 10 MG tablet Commonly known as:  ARICEPT Take 1 tablet (10 mg total) by mouth at bedtime.  Start with 1/2 tablet for 2 weeks then increase to a whole tablet   furosemide 20 MG tablet Commonly known as:  LASIX Take 20-40 mg by mouth daily at 2 PM. 40 mg every morning and 20 mg daily at 2 pm.   ibuprofen 200 MG tablet Commonly known as:  ADVIL,MOTRIN Take 200 mg by mouth every 6 (six) hours as needed for mild pain or moderate pain (with food).   irbesartan 150 MG tablet Commonly known as:  AVAPRO Take 1 tablet (150 mg total) by mouth daily. What changed:  how much to take   loratadine 10 MG tablet Commonly known as:   CLARITIN Take 10 mg by mouth daily as needed for allergies.   metFORMIN 1000 MG tablet Commonly known as:  GLUCOPHAGE Take 1 tablet (1,000 mg total) by mouth 2 (two) times daily with a meal.   Mineral Oil Oil Place 2 drops into both ears every Wednesday.   nitrofurantoin (macrocrystal-monohydrate) 100 MG capsule Commonly known as:  MACROBID Take 1 capsule (100 mg total) by mouth every 12 (twelve) hours for 4 days.   nystatin cream Commonly known as:  MYCOSTATIN Apply 1 application topically 2 (two) times daily. To groin and buttocks   potassium chloride SA 20 MEQ tablet Commonly known as:  K-DUR,KLOR-CON Take 20 mEq by mouth daily.   sodium chloride 0.65 % nasal spray Commonly known as:  OCEAN Place 1 spray into the nose at bedtime as needed for congestion.   TUSSIN DM 10-100 MG/5ML liquid Generic drug:  dextromethorphan-guaiFENesin Take 10 mLs by mouth every 4 (four) hours as needed for cough.   Vitamin D3 2000 units capsule Take 2,000 Units by mouth daily.        Relevant Imaging Results:  Relevant Lab Results:   Additional Information SSN: [id number]    HHPT/HHOT/HHRN  Burnis Medin, LCSW

## 2017-08-30 NOTE — Discharge Summary (Signed)
Physician Discharge Summary  Rachel Rangel VHQ:[id number] DOB: 1933/12/06 DOA: 08/24/2017  PCP: Patient, No Pcp Per  Admit date: 08/24/2017 Discharge date: 08/30/2017  Time spent: 65 minutes  Recommendations for Outpatient Follow-up:  1. Follow-up with MD at skilled nursing facility.  Patient's diabetes mellitus will need to be reassessed as patient's Actoplus MET XR was discontinued and patient placed on metformin 1000 mg twice daily due to concerns that pioglitazone may have contributed to her CHF exacerbation.  Patient will need a basic metabolic profile done in 1 week to follow-up on electrolytes and renal function.   Discharge Diagnoses:  Principal Problem:   Acute respiratory failure with hypoxia (HCC) Active Problems:   COPD exacerbation (HCC)   Diabetes mellitus type 2, controlled, without complications ([place])   Dementia without behavioral disturbance   Essential hypertension   Acute renal failure (ARF) (HCC)   Acute on chronic diastolic CHF (congestive heart failure) ([place])   Acute lower UTI   Discharge Condition: Stable and improved  Diet recommendation: Heart healthy  Filed Weights   08/28/17 0500 08/29/17 0609 08/30/17 0658  Weight: 94.8 kg (208 lb 15.9 oz) 93.3 kg (205 lb 11 oz) 94.1 kg (207 lb 7.3 oz)    History of present illness:  Per Dr [name] is a 83 y.o. female with medical history significant of dementia; DM; and HTN presented with SOB.  Patient reported that she had been "having some problems with, um, see, I can't even think now.  With breathing. ... To some degree for a couple of weeks".  Some cough, nonproductive.  No fevers.  No sick contacts.  Denied problems with her breathing usually.  +LE edema for a couple of months.  No chest pain.  History was limited by her dementia.   ED Course: SOB for a few days.  125 mg Sloumedrol and Alubterol with EMS.  Another neb in ER.  No formal diagnosis of COPD.  Wheezing improved, O2 sats ok at rest  but decreased to 88% with minimal movement.      Hospital Course:  #1 acute respiratory failure with hypoxia secondary to acute on chronic diastolic heart failure, +/- acute COPD exacerbation. Patient had presented with acute respiratory failure with hypoxia with a elevated BNP noted to be wheezing as well.  Initial concern was that patient may have had an acute COPD exacerbation in the setting of acute on chronic CHF exacerbation.  Patient was placed on IV steroid taper, scheduled nebulizers with not that much significant improvement.  Patient was subsequently placed on IV Lasix with good diuresis.   Patient was -5.9 L during his hospitalization.  2 D Echo with EF 60-70%, mild LVH, NWMA.  Patient improved clinically and subsequently transitioned back to oral Lasix.  Patient's hypoxia improved  is that by day of discharge patient was satting 95% on room air.  IV steroids were tapered down to off.  Patient will be discharged back on home regimen of oral Lasix and will need to follow-up with PCP/MD at assisted living facility.  Patient's Actoplus MET XR has been discontinued due to concerns that pioglitazone may have contributed to her acute CHF exacerbation.  Patient will be discharged in stable and improved condition.   2.  Diabetes mellitus type 2 Hemoglobin A1c 6.9.  CBGs have ranged from 154-223.  Likely steroid-induced.  He was placed on a steroid taper on admission as it was felt patient may have a component of COPD exacerbation.  Patient was maintained  on sliding scale insulin and meal coverage insulin with better control of CBGs as steroids were titrated.  Patient's home regimen of  Actoplus MET XR was discontinued on discharge as patient had presented with acute CHF exacerbation and pioglitazone may have triggered her acute exacerbation.  Patient was discharged on metformin 1000 mg twice daily.  Patient will need to follow-up with PCP/MD at his assisted living facility.  3.   Hypertension Continued home regimen of Avapro, Norvasc.  4.  Dementia without behavioral disturbance Stable.  Continued on home regimen of Celexa, Aricept.  5.  Hyperkalemia Resolved.  #6 acute kidney injury Creatinine noted to be slightly elevated to 1.49 on day of admission.  Patient was noted during the hospitalization to be in acute on chronic CHF exacerbation.  Patient was diuresed with IV Lasix with improvement with renal function such that by day of discharge patient's creatinine was down to 1.13.  Patient was placed back on home regimen of oral Lasix.  Outpatient follow-up with PCP/MD at assisted living facility.  7.  E. coli UTI Patient with some complaints of dysuria.  Urine cultures with 90,000 colonies of E. coli.    Patient was started on Macrobid will be discharged on 4 more days of Macrobid to complete a 5-day course of antibiotic treatment.  Outpatient follow-up.        Procedures:  Chest x-ray 08/24/2017  Renal ultrasound 08/25/2017  2D echo 08/26/2017      Consultations:  None  Discharge Exam: Vitals:   08/29/17 2225 08/30/17 0658  BP: 137/72 (!) 167/64  Pulse: (!) 47 (!) 45  Resp: 16 16  Temp: 97.7 F (36.5 C) 97.6 F (36.4 C)  SpO2: 93% 95%    General: NAD Cardiovascular: RRR Respiratory: CTAB  Discharge Instructions   Discharge Instructions    Diet - low sodium heart healthy   Complete by:  As directed    Increase activity slowly   Complete by:  As directed      Allergies as of 08/30/2017      Reactions   Lipitor [atorvastatin]    Intolerant to 40 MG due to myalgias, however tolerates 20 MG dose   Penicillins    Can't recall---Per [facility] records states nausea Has patient had a PCN reaction causing immediate rash, facial/tongue/throat swelling, SOB or lightheadedness with hypotension: unknown Has patient had a PCN reaction causing severe rash involving mucus membranes or skin necrosis: unknown Has patient had a PCN reaction  that required hospitalization unknown Has patient had a PCN reaction occurring within the last 10 years: unknown      Medication List    STOP taking these medications   ACTOPLUS MET XR 30-1000 MG Tb24 Generic drug:  Pioglitazone HCl-Metformin HCl     TAKE these medications   amLODipine 10 MG tablet Commonly known as:  NORVASC Take 10 mg by mouth daily.   APAP 500 PO Take 500 mg by mouth every 6 (six) hours as needed (pain).   BIOFREEZE 4 % Gel Generic drug:  Menthol (Topical Analgesic) Apply 1 application topically 2 (two) times daily as needed (upper arm/shoulder pain).   citalopram 10 MG tablet Commonly known as:  CELEXA Take 1 tablet (10 mg total) by mouth daily.   docusate sodium 100 MG capsule Commonly known as:  COLACE Take 1 capsule (100 mg total) by mouth 2 (two) times daily.   donepezil 10 MG tablet Commonly known as:  ARICEPT Take 1 tablet (10 mg total) by mouth at bedtime.  Start with 1/2 tablet for 2 weeks then increase to a whole tablet   furosemide 20 MG tablet Commonly known as:  LASIX Take 20-40 mg by mouth daily at 2 PM. 40 mg every morning and 20 mg daily at 2 pm.   ibuprofen 200 MG tablet Commonly known as:  ADVIL,MOTRIN Take 200 mg by mouth every 6 (six) hours as needed for mild pain or moderate pain (with food).   irbesartan 150 MG tablet Commonly known as:  AVAPRO Take 1 tablet (150 mg total) by mouth daily. What changed:  how much to take   loratadine 10 MG tablet Commonly known as:  CLARITIN Take 10 mg by mouth daily as needed for allergies.   metFORMIN 1000 MG tablet Commonly known as:  GLUCOPHAGE Take 1 tablet (1,000 mg total) by mouth 2 (two) times daily with a meal.   Mineral Oil Oil Place 2 drops into both ears every Wednesday.   nitrofurantoin (macrocrystal-monohydrate) 100 MG capsule Commonly known as:  MACROBID Take 1 capsule (100 mg total) by mouth every 12 (twelve) hours for 4 days.   nystatin cream Commonly known as:   MYCOSTATIN Apply 1 application topically 2 (two) times daily. To groin and buttocks   potassium chloride SA 20 MEQ tablet Commonly known as:  K-DUR,KLOR-CON Take 20 mEq by mouth daily.   sodium chloride 0.65 % nasal spray Commonly known as:  OCEAN Place 1 spray into the nose at bedtime as needed for congestion.   TUSSIN DM 10-100 MG/5ML liquid Generic drug:  dextromethorphan-guaiFENesin Take 10 mLs by mouth every 4 (four) hours as needed for cough.   Vitamin D3 2000 units capsule Take 2,000 Units by mouth daily.      Allergies  Allergen Reactions  . Lipitor [Atorvastatin]     Intolerant to 40 MG due to myalgias, however tolerates 20 MG dose  . Penicillins     Can't recall---Per [facility] records states nausea Has patient had a PCN reaction causing immediate rash, facial/tongue/throat swelling, SOB or lightheadedness with hypotension: unknown Has patient had a PCN reaction causing severe rash involving mucus membranes or skin necrosis: unknown Has patient had a PCN reaction that required hospitalization unknown Has patient had a PCN reaction occurring within the last 10 years: unknown      Contact information for follow-up providers    Home, [facility] Follow up.   Specialty:  [place] Why:  [facility] nursing/physical/occupational therapy Contact information: [address] [place] [address] [phone]        MD AT ALF Follow up.   Why:  F/U WITH MD AT ALF           Contact information for after-discharge care    Destination    [facility] Follow up.   Service:  Assisted Living Contact information: [address] [place] [phone]                   The results of significant diagnostics from this hospitalization (including imaging, microbiology, ancillary and laboratory) are listed below for reference.    Significant Diagnostic Studies: Dg Chest 2 View  Result Date:  08/24/2017 CLINICAL DATA:  Shortness of breath with nausea EXAM: CHEST  2 VIEW COMPARISON:  07/08/2017 FINDINGS: Mild cardiomegaly with aortic atherosclerosis. No pleural effusion. Coarse interstitial opacity likely chronic. Small focus of atelectasis or scar in the left mid lung. Negative for pneumothorax. Stable compression deformity at the thoracolumbar  junction. IMPRESSION: 1. Mild cardiomegaly. Negative for edema or acute pulmonary infiltrate. Electronically Signed   By: Donavan Foil M.D.   On: 08/24/2017 23:07   US Renal  Result Date: 08/25/2017 CLINICAL DATA:  Acute renal failure. EXAM: RENAL / URINARY TRACT ULTRASOUND COMPLETE COMPARISON:  None. FINDINGS: Right Kidney: Length: 10.3 cm. Echogenicity within normal limits. No mass or hydronephrosis visualized. Left Kidney: Length: 10.8 cm. Echogenicity within normal limits. No mass or hydronephrosis visualized. Bladder: Appears normal for degree of bladder distention. IMPRESSION: Normal renal ultrasound. Electronically Signed   By: Marijo Conception, M.D.   On: 08/25/2017 14:21    Microbiology: Recent Results (from the past 240 hour(s))  MRSA PCR Screening     Status: None   Collection Time: 08/25/17  4:33 AM  Result Value Ref Range Status   MRSA by PCR NEGATIVE NEGATIVE Final    Comment:        The GeneXpert MRSA Assay (FDA approved for NASAL specimens only), is one component of a comprehensive MRSA colonization surveillance program. It is not intended to diagnose MRSA infection nor to guide or monitor treatment for MRSA infections.   Culture, Urine     Status: Abnormal   Collection Time: 08/26/17  6:37 PM  Result Value Ref Range Status   Specimen Description URINE, RANDOM  Final   Special Requests NONE  Final   Culture 90,000 COLONIES/mL ESCHERICHIA COLI (A)  Final   Report Status 08/30/2017 FINAL  Final   Organism ID, Bacteria ESCHERICHIA COLI (A)  Final      Susceptibility   Escherichia coli - MIC*    AMPICILLIN 8  SENSITIVE Sensitive     CEFAZOLIN <=4 SENSITIVE Sensitive     CEFTRIAXONE <=1 SENSITIVE Sensitive     CIPROFLOXACIN <=0.25 SENSITIVE Sensitive     GENTAMICIN <=1 SENSITIVE Sensitive     IMIPENEM <=0.25 SENSITIVE Sensitive     NITROFURANTOIN <=16 SENSITIVE Sensitive     TRIMETH/SULFA <=20 SENSITIVE Sensitive     AMPICILLIN/SULBACTAM <=2 SENSITIVE Sensitive     PIP/TAZO <=4 SENSITIVE Sensitive     Extended ESBL NEGATIVE Sensitive     * 90,000 COLONIES/mL ESCHERICHIA COLI     Labs: Basic Metabolic Panel: Recent Labs  Lab 08/26/17 1137 08/27/17 0941 08/28/17 0518 08/29/17 0531 08/30/17 0502  NA 132* 133* 136 134* 134*  K 5.5* 4.9 4.6 4.1 3.9  CL 97* 97* 98* 95* 96*  CO2 _0 GLUCOSE 295* 238* 135* 142* 149*  BUN 41* 49* 45* 40* 40*  CREATININE 1.33* 1.45* 1.12* 1.06* 1.13*  CALCIUM 10.3 9.7 10.0 9.7 9.5  MG  --  2.4  --   --   --    Liver Function Tests: No results for input(s): AST, ALT, ALKPHOS, BILITOT, PROT, ALBUMIN in the last 168 hours. No results for input(s): LIPASE, AMYLASE in the last 168 hours. No results for input(s): AMMONIA in the last 168 hours. CBC: Recent Labs  Lab 08/24/17 2220 08/25/17 0431 08/27/17 0941 08/28/17 0518 08/29/17 0531 08/30/17 0502  WBC 10.2 11.6* 12.8* 12.1* 9.9 10.5  NEUTROABS 5.2  --  11.1* 8.2* 6.5 6.8  HGB 12.5 11.5* 11.6* 12.6 14.5 13.7  HCT 37.9 36.4 35.7* 38.8 42.4 41.5  MCV 91.3 93.3 92.5 91.3 89.5 89.8  PLT 270 260 267 289 269 293   Cardiac Enzymes: No results for input(s): CKTOTAL, CKMB, CKMBINDEX, TROPONINI in the last 168 hours. BNP: BNP (last 3 results) Recent Labs  08/25/17 0431  BNP 195.4*    ProBNP (last 3 results) Recent Labs    05/08/17 1243  PROBNP 254.0*    CBG: Recent Labs  Lab 08/29/17 0750 08/29/17 1156 08/29/17 1654 08/29/17 2217 08/30/17 0745  GLUCAP 235* 154* 223* 125* 143*       Signed:  Irine Seal MD.  [facility] 08/30/2017, 11:04 AM

## 2017-08-30 NOTE — Progress Notes (Signed)
Patient returning to [facility]. Facility aware of patient's discharge and confirmed patient's ability to return. [facility] called for transport, patient's daughter aware. Patient's RN provided with packet and number to call report. CSW signing off, no other needs identified.   Celso SickleKimberly Cordero Surette, ConnecticutLCSWA Clinical Social Worker [facility] Cell#: [phone]

## 2017-08-30 NOTE — Care Management Note (Addendum)
Case Management Note  Patient Details  Name: Rachel Rangel MRN: [id number] Date of Birth: Sep 06, 1934  Subjective/Objective:    Acute resp failure, COPD, DM, CHF                Action/Plan: Discharge Planning: NCM contacted [facility] to make aware of dc home with resumption of care and HHPT/OT, SW and aide added to orders. CSW following for dc back to ALF. Faxed orders to [facility].   Expected Discharge Date:  08/30/17               Expected Discharge Plan:  Assisted Living / Rest Home  In-House Referral:  Clinical Social Work  Discharge planning Services  CM Consult  Post Acute Care Choice:  [facility] Choice offered to:  Patient  DME Arranged:  N/A DME Agency:  NA  HH Arranged:  RN, PT, OT HH Agency:  [facility]  Status of Service:  Completed, signed off  If discussed at [employer] of [employer] Meetings, dates discussed:    Additional Comments:  Elliot CousinShavis, Annaya Bangert Ellen, RN 08/30/2017, 11:47 AM

## 2017-08-30 NOTE — Progress Notes (Signed)
[facility] called to Guide RockMonique, [facility]. [facility] here to pick patient up for transport back to facility. Lina SarBeth Gunther Zawadzki, RN

## 2018-07-23 ENCOUNTER — Emergency Department (HOSPITAL_COMMUNITY): Payer: Medicare Other

## 2018-07-23 ENCOUNTER — Encounter (HOSPITAL_COMMUNITY): Payer: Self-pay

## 2018-07-23 ENCOUNTER — Emergency Department (HOSPITAL_COMMUNITY)
Admission: EM | Admit: 2018-07-23 | Discharge: 2018-07-24 | Disposition: A | Discharge status: Skilled Nursing Facility | Payer: Medicare Other | Attending: Emergency Medicine | Admitting: Emergency Medicine

## 2018-07-23 DIAGNOSIS — Y939 Activity, unspecified: Secondary | ICD-10-CM | POA: Diagnosis not present

## 2018-07-23 DIAGNOSIS — F0390 Unspecified dementia without behavioral disturbance: Secondary | ICD-10-CM | POA: Diagnosis not present

## 2018-07-23 DIAGNOSIS — Z7984 Long term (current) use of oral hypoglycemic drugs: Secondary | ICD-10-CM | POA: Insufficient documentation

## 2018-07-23 DIAGNOSIS — W1830XA Fall on same level, unspecified, initial encounter: Secondary | ICD-10-CM | POA: Insufficient documentation

## 2018-07-23 DIAGNOSIS — E119 Type 2 diabetes mellitus without complications: Secondary | ICD-10-CM | POA: Diagnosis not present

## 2018-07-23 DIAGNOSIS — Y92129 Unspecified place in nursing home as the place of occurrence of the external cause: Secondary | ICD-10-CM | POA: Diagnosis not present

## 2018-07-23 DIAGNOSIS — N3000 Acute cystitis without hematuria: Secondary | ICD-10-CM | POA: Insufficient documentation

## 2018-07-23 DIAGNOSIS — I10 Essential (primary) hypertension: Secondary | ICD-10-CM | POA: Diagnosis not present

## 2018-07-23 DIAGNOSIS — S20211A Contusion of right front wall of thorax, initial encounter: Secondary | ICD-10-CM | POA: Insufficient documentation

## 2018-07-23 DIAGNOSIS — Y999 Unspecified external cause status: Secondary | ICD-10-CM | POA: Insufficient documentation

## 2018-07-23 DIAGNOSIS — Z79899 Other long term (current) drug therapy: Secondary | ICD-10-CM | POA: Insufficient documentation

## 2018-07-23 DIAGNOSIS — W19XXXA Unspecified fall, initial encounter: Secondary | ICD-10-CM

## 2018-07-23 DIAGNOSIS — J449 Chronic obstructive pulmonary disease, unspecified: Secondary | ICD-10-CM | POA: Insufficient documentation

## 2018-07-23 DIAGNOSIS — E860 Dehydration: Secondary | ICD-10-CM | POA: Diagnosis not present

## 2018-07-23 DIAGNOSIS — Z87891 Personal history of nicotine dependence: Secondary | ICD-10-CM | POA: Diagnosis not present

## 2018-07-23 DIAGNOSIS — Z043 Encounter for examination and observation following other accident: Secondary | ICD-10-CM | POA: Diagnosis present

## 2018-07-23 LAB — LIPASE, BLOOD: LIPASE: 43 U/L (ref 11–51)

## 2018-07-23 LAB — CBC WITH DIFFERENTIAL/PLATELET
Abs Immature Granulocytes: 0.15 10*3/uL — ABNORMAL HIGH (ref 0.00–0.07)
BASOS ABS: 0 10*3/uL (ref 0.0–0.1)
BASOS PCT: 0 %
EOS ABS: 0.1 10*3/uL (ref 0.0–0.5)
Eosinophils Relative: 1 %
HCT: 37.6 % (ref 36.0–46.0)
Hemoglobin: 12 g/dL (ref 12.0–15.0)
Immature Granulocytes: 1 %
LYMPHS ABS: 1.5 10*3/uL (ref 0.7–4.0)
Lymphocytes Relative: 10 %
MCH: 30 pg (ref 26.0–34.0)
MCHC: 31.9 g/dL (ref 30.0–36.0)
MCV: 94 fL (ref 80.0–100.0)
Monocytes Absolute: 1 10*3/uL (ref 0.1–1.0)
Monocytes Relative: 6 %
NEUTROS PCT: 82 %
NRBC: 0 % (ref 0.0–0.2)
Neutro Abs: 12.6 10*3/uL — ABNORMAL HIGH (ref 1.7–7.7)
Platelets: 270 10*3/uL (ref 150–400)
RBC: 4 MIL/uL (ref 3.87–5.11)
RDW: 12.5 % (ref 11.5–15.5)
WBC: 15.4 10*3/uL — AB (ref 4.0–10.5)

## 2018-07-23 LAB — COMPREHENSIVE METABOLIC PANEL
ALBUMIN: 4.4 g/dL (ref 3.5–5.0)
ALT: 15 U/L (ref 0–44)
ANION GAP: 14 (ref 5–15)
AST: 17 U/L (ref 15–41)
Alkaline Phosphatase: 70 U/L (ref 38–126)
BUN: 28 mg/dL — ABNORMAL HIGH (ref 8–23)
CO2: 23 mmol/L (ref 22–32)
Calcium: 10.4 mg/dL — ABNORMAL HIGH (ref 8.9–10.3)
Chloride: 99 mmol/L (ref 98–111)
Creatinine, Ser: 1.85 mg/dL — ABNORMAL HIGH (ref 0.44–1.00)
GFR calc non Af Amer: 24 mL/min — ABNORMAL LOW (ref >60)
GFR, EST AFRICAN AMERICAN: 28 mL/min — AB (ref >60)
GLUCOSE: 243 mg/dL — AB (ref 70–99)
POTASSIUM: 4.5 mmol/L (ref 3.5–5.1)
Sodium: 136 mmol/L (ref 135–145)
TOTAL PROTEIN: 7.9 g/dL (ref 6.5–8.1)
Total Bilirubin: 0.9 mg/dL (ref 0.3–1.2)

## 2018-07-23 MED ORDER — ONDANSETRON HCL 4 MG/2ML IJ SOLN
4.0000 mg | Freq: Once | INTRAMUSCULAR | Status: AC
  Administered 2018-07-23: 4 mg via INTRAVENOUS

## 2018-07-23 MED ORDER — ONDANSETRON HCL 4 MG/2ML IJ SOLN
4.0000 mg | Freq: Once | INTRAMUSCULAR | Status: DC
  Filled 2018-07-23: qty 2

## 2018-07-23 MED ORDER — SODIUM CHLORIDE 0.9 % IV SOLN
INTRAVENOUS | Status: DC
  Administered 2018-07-23: 22:00:00 via INTRAVENOUS

## 2018-07-23 MED ORDER — SODIUM CHLORIDE 0.9 % IV BOLUS
500.0000 mL | Freq: Once | INTRAVENOUS | Status: AC
  Administered 2018-07-23: 500 mL via INTRAVENOUS

## 2018-07-23 MED ORDER — HYDROMORPHONE HCL 1 MG/ML IJ SOLN
0.5000 mg | INTRAMUSCULAR | Status: DC | PRN
  Administered 2018-07-23: 0.5 mg via INTRAVENOUS
  Filled 2018-07-23: qty 1

## 2018-07-23 NOTE — ED Notes (Signed)
Bed: WU98 Expected date: 07/23/18 Expected time:  Means of arrival:  Comments: EMS: fall / back pain

## 2018-07-23 NOTE — ED Provider Notes (Signed)
[facility]-EMERGENCY DEPT Provider Note   CSN: [id number] Arrival date & time: 07/23/18  2009     History   Chief Complaint Chief Complaint  Patient presents with  . Fall    HPI Rachel Rangel is a 84 y.o. female.  HPI Pt resides at [facility].  Pt states she had a fall today.  She does not remember why she fell.  She is having pain in her back towards the right side.  The pain is sharp and increases with movement.  No shortness of breath.  Pt states she did not vomit in the ambulance on the way here but feels it was related to motion sickness.  No fevers.   Additional information was provided by the patient's daughter.  Patient may have vomited at the nursing home as well.  Think she may have hit her head.  She had not been complaining of any abdominal pain or other complaints earlier today Past Medical History:  Diagnosis Date  . Benign hypertension   . Dementia (HCC)   . Diabetes mellitus without complication (HCC)   . Edema    Left pedal   . History of complete eye exam 12/25/2012   Normal eye exam, no retinopathy [facility]  . Hyperlipidemia   . Osteoporosis 2010  . Overactive bladder   . Vitamin D deficiency     Patient Active Problem List   Diagnosis Date Noted  . Hypoxia   . Acute lower UTI 08/29/2017  . Acute renal failure (ARF) (HCC)   . Acute on chronic diastolic CHF (congestive heart failure) (HCC)   . COPD exacerbation (HCC) 08/24/2017  . Essential hypertension 05/09/2017  . Dyspnea on exertion 05/08/2017  . Dementia without behavioral disturbance (HCC) 12/12/2015  . T12 compression fracture (HCC) 09/08/2015  . UTI (lower urinary tract infection) 09/07/2015  . Community acquired pneumonia 09/07/2015  . Back pain 09/07/2015  . Fall 09/07/2015  . Diabetes mellitus type 2, controlled, without complications (HCC) 09/07/2015  . Leukocytosis 09/07/2015  . Acute respiratory failure with hypoxia (HCC) 09/07/2015  . Sepsis  (HCC) 09/07/2015    Past Surgical History:  Procedure Laterality Date  . ABDOMINAL HYSTERECTOMY  1975   partial     OB History   None      Home Medications    Prior to Admission medications   Medication Sig Start Date End Date Taking? Authorizing Provider  Acetaminophen (APAP 500 PO) Take 500 mg by mouth every 6 (six) hours as needed (pain).    Yes [provider]  amLODipine (NORVASC) 10 MG tablet Take 10 mg by mouth daily. 11/14/15  Yes [provider]  Cholecalciferol (VITAMIN D3) 2000 UNITS capsule Take 2,000 Units by mouth daily.    Yes [provider]  citalopram (CELEXA) 10 MG tablet Take 1 tablet (10 mg total) by mouth daily. 12/25/15  Yes Anson Fret, MD  docusate sodium (COLACE) 100 MG capsule Take 1 capsule (100 mg total) by mouth 2 (two) times daily. Patient taking differently: Take 100 mg by mouth 2 (two) times daily as needed for mild constipation.  08/30/17  Yes Rodolph Bong, MD  donepezil (ARICEPT) 10 MG tablet Take 1 tablet (10 mg total) by mouth at bedtime. Start with 1/2 tablet for 2 weeks then increase to a whole tablet 12/25/15  Yes Anson Fret, MD  furosemide (LASIX) 20 MG tablet Take 20 mg by mouth 2 (two) times daily.    Yes [provider]  ibuprofen (ADVIL,MOTRIN) 200 MG tablet Take 200 mg by mouth every 6 (six) hours as needed for mild pain or moderate pain (with food).    Yes [provider]  irbesartan (AVAPRO) 150 MG tablet Take 1 tablet (150 mg total) by mouth daily. Patient taking differently: Take 300 mg by mouth daily.  05/08/17  Yes Nyoka Cowden, MD  loratadine (CLARITIN) 10 MG tablet Take 10 mg by mouth daily as needed for allergies.   Yes [provider]  metFORMIN (GLUCOPHAGE) 1000 MG tablet Take 1 tablet (1,000 mg total) by mouth 2 (two) times daily with a meal. 08/30/17 08/30/18 Yes Rodolph Bong, MD  Mineral Oil OIL Place 2 drops into both ears every Wednesday.   Yes  [provider]  nystatin cream (MYCOSTATIN) Apply 1 application topically 2 (two) times daily. To groin and buttocks 12/09/15  Yes [provider]  ondansetron (ZOFRAN) 4 MG tablet Take 4 mg by mouth every 8 (eight) hours as needed for nausea or vomiting.   Yes [provider]  potassium chloride (K-DUR,KLOR-CON) 10 MEQ tablet Take 10 mEq by mouth daily.   Yes [provider]  rosuvastatin (CRESTOR) 5 MG tablet Take 5 mg by mouth daily.   Yes [provider]  cephALEXin (KEFLEX) 500 MG capsule Take 1 capsule (500 mg total) by mouth 2 (two) times daily. 07/24/18   Linwood Dibbles, MD    Family History Family History  Problem Relation Age of Onset  . Pancreatic cancer Mother   . Glaucoma Mother   . Hypertension Father   . Hypertension Maternal Grandmother   . Glaucoma Maternal Grandfather   . Hypertension Paternal Grandmother   . Hypertension Paternal Grandfather   . Glaucoma Maternal Aunt   . Glaucoma Maternal Aunt   . Dementia Neg Hx     Social History Social History   Tobacco Use  . Smoking status: Former Smoker    Packs/day: 1.00    Years: 40.00    Pack years: 40.00    Types: Cigarettes    Last attempt to quit: 09/30/2014    Years since quitting: 3.8  . Smokeless tobacco: Never Used  Substance Use Topics  . Alcohol use: Yes    Alcohol/week: 0.0 standard drinks    Comment: ocass  . Drug use: No     Allergies   Lipitor [atorvastatin] and Penicillins   Review of Systems Review of Systems  All other systems reviewed and are negative.    Physical Exam Updated Vital Signs BP (!) 156/76   Pulse 66   Temp 98.2 F (36.8 C) (Oral)   Resp 19   Wt 85.5 kg   SpO2 92%   BMI 34.49 kg/m   Physical Exam  Constitutional: She appears well-developed and well-nourished. No distress.  HENT:  Head: Normocephalic and atraumatic.  Right Ear: External ear normal.  Left Ear: External ear normal.  Eyes: Conjunctivae are normal. Right  eye exhibits no discharge. Left eye exhibits no discharge. No scleral icterus.  Neck: Neck supple. No tracheal deviation present.  Cardiovascular: Normal rate, regular rhythm and intact distal pulses.  Pulmonary/Chest: Effort normal and breath sounds normal. No stridor. No respiratory distress. She has no wheezes. She has no rales.  Tenderness palpation right posterior ribs but no crepitus, no bruising  Abdominal: Soft. Bowel sounds are normal. She exhibits no distension. There is tenderness in the epigastric area. There is no rebound and no guarding.  Musculoskeletal: She exhibits no edema  or tenderness.  Neurological: She is alert. She has normal strength. No cranial nerve deficit (no facial droop, extraocular movements intact, no slurred speech) or sensory deficit. She exhibits normal muscle tone. She displays no seizure activity. Coordination normal.  Skin: Skin is warm and dry. No rash noted.  Psychiatric: She has a normal mood and affect.  Nursing note and vitals reviewed.    ED Treatments / Results  Labs (all labs ordered are listed, but only abnormal results are displayed) Labs Reviewed  COMPREHENSIVE METABOLIC PANEL - Abnormal; Notable for the following components:      Result Value   Glucose, Bld 243 (*)    BUN 28 (*)    Creatinine, Ser 1.85 (*)    Calcium 10.4 (*)    GFR calc non Af Amer 24 (*)    GFR calc Af Amer 28 (*)    All other components within normal limits  CBC WITH DIFFERENTIAL/PLATELET - Abnormal; Notable for the following components:   WBC 15.4 (*)    Neutro Abs 12.6 (*)    Abs Immature Granulocytes 0.15 (*)    All other components within normal limits  URINALYSIS, ROUTINE W REFLEX MICROSCOPIC - Abnormal; Notable for the following components:   Glucose, UA 50 (*)    Leukocytes, UA SMALL (*)    Bacteria, UA MANY (*)    All other components within normal limits  LIPASE, BLOOD    EKG None  Radiology Dg Ribs Unilateral W/chest Right  Result Date:  07/23/2018 CLINICAL DATA:  Fall at nursing home. Right posterior lower rib pain. EXAM: RIGHT RIBS AND CHEST - 3+ VIEW COMPARISON:  Chest radiograph 08/24/2017 FINDINGS: Technically limited exam given patient's medical condition, unable to move position. Additionally soft tissue attenuation from habitus and bony under mineralization further limits assessment. Particularly, lower ribs are not well evaluated. No visualized rib fractures. No pulmonary complication such as pneumothorax or pleural effusion. No focal airspace disease. Cardiomegaly is grossly similar allowing for differences in technique. There is aortic atherosclerosis. Vascular congestion versus bronchovascular crowding. IMPRESSION: 1. No visualized rib fractures on limited exam. Patient's diminished mobility and soft tissue attenuation from habitus limits assessment, particularly of the lower ribs. 2. Low lung volumes with vascular congestion versus bronchovascular crowding. Cardiomegaly is similar to exam last year. Electronically Signed   By: Narda Rutherford M.D.   On: 07/23/2018 21:34   Ct Head Wo Contrast  Result Date: 07/23/2018 CLINICAL DATA:  Fall. EXAM: CT HEAD WITHOUT CONTRAST CT CERVICAL SPINE WITHOUT CONTRAST TECHNIQUE: Multidetector CT imaging of the head and cervical spine was performed following the standard protocol without intravenous contrast. Multiplanar CT image reconstructions of the cervical spine were also generated. COMPARISON:  None. FINDINGS: CT HEAD FINDINGS Brain: There is severe atrophy and chronic small vessel disease changes. No acute intracranial abnormality. Specifically, no hemorrhage, hydrocephalus, mass lesion, acute infarction, or significant intracranial injury. Vascular: No hyperdense vessel or unexpected calcification. Skull: No acute calvarial abnormality. Sinuses/Orbits: Visualized paranasal sinuses and mastoids clear. Orbital soft tissues unremarkable. Other: None CT CERVICAL SPINE FINDINGS Alignment: No  subluxation Skull base and vertebrae: No fracture Soft tissues and spinal canal: Prevertebral soft tissues are normal. No epidural or paraspinal hematoma. Disc levels:  Diffuse degenerative disc and facet disease. Upper chest: No acute findings Other: Diffuse osteopenia. IMPRESSION: Severe chronic small vessel disease and cerebral atrophy. No acute intracranial abnormality. Osteopenia. Diffuse degenerative disc and facet disease. No acute bony abnormality in the cervical spine. Electronically Signed   By: Caryn Bee  [place] M.D.   On: 07/23/2018 22:19   Ct Cervical Spine Wo Contrast  Result Date: 07/23/2018 CLINICAL DATA:  Fall. EXAM: CT HEAD WITHOUT CONTRAST CT CERVICAL SPINE WITHOUT CONTRAST TECHNIQUE: Multidetector CT imaging of the head and cervical spine was performed following the standard protocol without intravenous contrast. Multiplanar CT image reconstructions of the cervical spine were also generated. COMPARISON:  None. FINDINGS: CT HEAD FINDINGS Brain: There is severe atrophy and chronic small vessel disease changes. No acute intracranial abnormality. Specifically, no hemorrhage, hydrocephalus, mass lesion, acute infarction, or significant intracranial injury. Vascular: No hyperdense vessel or unexpected calcification. Skull: No acute calvarial abnormality. Sinuses/Orbits: Visualized paranasal sinuses and mastoids clear. Orbital soft tissues unremarkable. Other: None CT CERVICAL SPINE FINDINGS Alignment: No subluxation Skull base and vertebrae: No fracture Soft tissues and spinal canal: Prevertebral soft tissues are normal. No epidural or paraspinal hematoma. Disc levels:  Diffuse degenerative disc and facet disease. Upper chest: No acute findings Other: Diffuse osteopenia. IMPRESSION: Severe chronic small vessel disease and cerebral atrophy. No acute intracranial abnormality. Osteopenia. Diffuse degenerative disc and facet disease. No acute bony abnormality in the cervical spine. Electronically Signed    By: Charlett Nose M.D.   On: 07/23/2018 22:19    Procedures Procedures (including critical care time)  Medications Ordered in ED Medications  sodium chloride 0.9 % bolus 500 mL (0 mLs Intravenous Stopped 07/23/18 2304)    And  0.9 %  sodium chloride infusion ( Intravenous New Bag/Given 07/23/18 2152)  HYDROmorphone (DILAUDID) injection 0.5 mg (0.5 mg Intravenous Given 07/23/18 2155)  cefTRIAXone (ROCEPHIN) 1 g in sodium chloride 0.9 % 100 mL IVPB (has no administration in time range)  ondansetron (ZOFRAN) injection 4 mg (4 mg Intravenous Given 07/23/18 2154)     Initial Impression / Assessment and Plan / ED Course  I have reviewed the triage vital signs and the nursing notes.  Pertinent labs & imaging results that were available during my care of the patient were reviewed by me and considered in my medical decision making (see chart for details).  Clinical Course as of Jul 24 20  Fri Jul 24, 2018  0018 BUN and creatinine slightly increased.  Glucose increase.  Urinalysis consistent with urinary tract infection   [JK]    Clinical Course User Index [JK] Linwood Dibbles, MD  Patient presented to the emergency room after a fall.  NO fx noted with her fall.  Probable rib contusion.  Cannot exclude occult fx.  Labs show mild dehydration, iv fluids given in the ED.  ua c/w uti.  Will give a dose of rocephin.  Dc home with keflex.  Final Clinical Impressions(s) / ED Diagnoses   Final diagnoses:  Fall, initial encounter  Contusion of rib on right side, initial encounter  Acute cystitis without hematuria  Dehydration    ED Discharge Orders         Ordered    cephALEXin (KEFLEX) 500 MG capsule  2 times daily     07/24/18 0020           Linwood Dibbles, MD 07/24/18 0021

## 2018-07-23 NOTE — ED Triage Notes (Signed)
Pt arrived via [facility] due to an unwitnessed fall at [facility]. Reports lower back pain, denies blood thinners. Had multiple episodes of vomiting en route. Facility reports pts mental status is at baseline.

## 2018-07-24 LAB — URINALYSIS, ROUTINE W REFLEX MICROSCOPIC
Bilirubin Urine: NEGATIVE
Glucose, UA: 50 mg/dL — AB
HGB URINE DIPSTICK: NEGATIVE
Ketones, ur: NEGATIVE mg/dL
Nitrite: NEGATIVE
Protein, ur: NEGATIVE mg/dL
SPECIFIC GRAVITY, URINE: 1.013 (ref 1.005–1.030)
pH: 5 (ref 5.0–8.0)

## 2018-07-24 MED ORDER — SODIUM CHLORIDE 0.9 % IV SOLN
1.0000 g | Freq: Once | INTRAVENOUS | Status: AC
  Administered 2018-07-24: 1 g via INTRAVENOUS
  Filled 2018-07-24: qty 10

## 2018-07-24 MED ORDER — CEPHALEXIN 500 MG PO CAPS: 500.0000 mg | ORAL_CAPSULE | Freq: Two times a day (BID) | ORAL | 0 refills | Status: DC

## 2018-07-24 NOTE — Discharge Instructions (Addendum)
Take over-the-counter medications as needed for pain, follow-up with your doctor next week to be rechecked if the symptoms are not improving, return to the ER as needed for worsening symptoms

## 2018-07-24 NOTE — ED Notes (Signed)
[facility] called for transport.  

## 2018-07-26 LAB — URINE CULTURE

## 2018-07-27 ENCOUNTER — Telehealth: Payer: Self-pay | Admitting: Emergency Medicine

## 2018-07-27 NOTE — Telephone Encounter (Signed)
Post ED Visit - Positive Culture Follow-up  Culture report reviewed by antimicrobial stewardship pharmacist:  []  Enzo Bi, Pharm.D. []  Celedonio Miyamoto, Pharm.D., BCPS AQ-ID []  Garvin Fila, Pharm.D., BCPS []  Georgina Pillion, Pharm.D., BCPS []  [place], [address].D., BCPS, AAHIVP []  Estella Husk, Pharm.D., BCPS, AAHIVP [x]  Lysle Pearl, PharmD, BCPS []  Phillips Climes, PharmD, BCPS []  Agapito Games, PharmD, BCPS []  Verlan Friends, PharmD  Positive urine culture Treated with cephalexin, organism sensitive to the same and no further patient follow-up is required at this time.  Berle Mull 07/27/2018, 12:00 PM

## 2018-07-28 ENCOUNTER — Encounter (HOSPITAL_COMMUNITY): Payer: Self-pay | Admitting: *Deleted

## 2018-07-28 ENCOUNTER — Other Ambulatory Visit: Payer: Self-pay

## 2018-07-28 ENCOUNTER — Observation Stay (HOSPITAL_COMMUNITY)
Admission: EM | Admit: 2018-07-28 | Discharge: 2018-07-31 | Disposition: A | Discharge status: Skilled Nursing Facility | Payer: Medicare Other | Attending: Internal Medicine | Admitting: Internal Medicine

## 2018-07-28 ENCOUNTER — Emergency Department (HOSPITAL_COMMUNITY): Payer: Medicare Other

## 2018-07-28 DIAGNOSIS — Z87891 Personal history of nicotine dependence: Secondary | ICD-10-CM | POA: Insufficient documentation

## 2018-07-28 DIAGNOSIS — Z8249 Family history of ischemic heart disease and other diseases of the circulatory system: Secondary | ICD-10-CM | POA: Diagnosis not present

## 2018-07-28 DIAGNOSIS — G308 Other Alzheimer's disease: Secondary | ICD-10-CM | POA: Diagnosis not present

## 2018-07-28 DIAGNOSIS — F0390 Unspecified dementia without behavioral disturbance: Secondary | ICD-10-CM | POA: Diagnosis not present

## 2018-07-28 DIAGNOSIS — Z7984 Long term (current) use of oral hypoglycemic drugs: Secondary | ICD-10-CM | POA: Insufficient documentation

## 2018-07-28 DIAGNOSIS — Y92129 Unspecified place in nursing home as the place of occurrence of the external cause: Secondary | ICD-10-CM | POA: Diagnosis not present

## 2018-07-28 DIAGNOSIS — M545 Low back pain: Secondary | ICD-10-CM | POA: Diagnosis present

## 2018-07-28 DIAGNOSIS — Z888 Allergy status to other drugs, medicaments and biological substances status: Secondary | ICD-10-CM | POA: Diagnosis not present

## 2018-07-28 DIAGNOSIS — G8929 Other chronic pain: Secondary | ICD-10-CM | POA: Insufficient documentation

## 2018-07-28 DIAGNOSIS — Z79899 Other long term (current) drug therapy: Secondary | ICD-10-CM | POA: Diagnosis not present

## 2018-07-28 DIAGNOSIS — N390 Urinary tract infection, site not specified: Secondary | ICD-10-CM | POA: Diagnosis present

## 2018-07-28 DIAGNOSIS — J9601 Acute respiratory failure with hypoxia: Secondary | ICD-10-CM | POA: Insufficient documentation

## 2018-07-28 DIAGNOSIS — I509 Heart failure, unspecified: Secondary | ICD-10-CM | POA: Insufficient documentation

## 2018-07-28 DIAGNOSIS — I700 Atherosclerosis of aorta: Secondary | ICD-10-CM | POA: Diagnosis not present

## 2018-07-28 DIAGNOSIS — E1122 Type 2 diabetes mellitus with diabetic chronic kidney disease: Secondary | ICD-10-CM | POA: Insufficient documentation

## 2018-07-28 DIAGNOSIS — R52 Pain, unspecified: Secondary | ICD-10-CM

## 2018-07-28 DIAGNOSIS — W19XXXA Unspecified fall, initial encounter: Secondary | ICD-10-CM | POA: Diagnosis present

## 2018-07-28 DIAGNOSIS — Z66 Do not resuscitate: Secondary | ICD-10-CM | POA: Diagnosis not present

## 2018-07-28 DIAGNOSIS — E785 Hyperlipidemia, unspecified: Secondary | ICD-10-CM | POA: Insufficient documentation

## 2018-07-28 DIAGNOSIS — R0902 Hypoxemia: Secondary | ICD-10-CM | POA: Diagnosis present

## 2018-07-28 DIAGNOSIS — I130 Hypertensive heart and chronic kidney disease with heart failure and stage 1 through stage 4 chronic kidney disease, or unspecified chronic kidney disease: Secondary | ICD-10-CM | POA: Insufficient documentation

## 2018-07-28 DIAGNOSIS — Z880 Allergy status to penicillin: Secondary | ICD-10-CM | POA: Insufficient documentation

## 2018-07-28 DIAGNOSIS — S32000A Wedge compression fracture of unspecified lumbar vertebra, initial encounter for closed fracture: Secondary | ICD-10-CM

## 2018-07-28 DIAGNOSIS — B9620 Unspecified Escherichia coli [E. coli] as the cause of diseases classified elsewhere: Secondary | ICD-10-CM | POA: Insufficient documentation

## 2018-07-28 DIAGNOSIS — J449 Chronic obstructive pulmonary disease, unspecified: Secondary | ICD-10-CM | POA: Diagnosis not present

## 2018-07-28 DIAGNOSIS — E119 Type 2 diabetes mellitus without complications: Secondary | ICD-10-CM

## 2018-07-28 DIAGNOSIS — N179 Acute kidney failure, unspecified: Secondary | ICD-10-CM

## 2018-07-28 DIAGNOSIS — N183 Chronic kidney disease, stage 3 (moderate): Secondary | ICD-10-CM | POA: Insufficient documentation

## 2018-07-28 DIAGNOSIS — R296 Repeated falls: Principal | ICD-10-CM | POA: Insufficient documentation

## 2018-07-28 DIAGNOSIS — I10 Essential (primary) hypertension: Secondary | ICD-10-CM | POA: Diagnosis present

## 2018-07-28 DIAGNOSIS — N189 Chronic kidney disease, unspecified: Secondary | ICD-10-CM

## 2018-07-28 DIAGNOSIS — E559 Vitamin D deficiency, unspecified: Secondary | ICD-10-CM | POA: Insufficient documentation

## 2018-07-28 HISTORY — DX: Heart failure, unspecified: I50.9

## 2018-07-28 LAB — CBC WITH DIFFERENTIAL/PLATELET
Abs Immature Granulocytes: 0.24 10*3/uL — ABNORMAL HIGH (ref 0.00–0.07)
BASOS ABS: 0 10*3/uL (ref 0.0–0.1)
BASOS PCT: 0 %
Eosinophils Absolute: 0.2 10*3/uL (ref 0.0–0.5)
Eosinophils Relative: 1 %
HEMATOCRIT: 36.1 % (ref 36.0–46.0)
Hemoglobin: 11.6 g/dL — ABNORMAL LOW (ref 12.0–15.0)
IMMATURE GRANULOCYTES: 2 %
Lymphocytes Relative: 10 %
Lymphs Abs: 1.3 10*3/uL (ref 0.7–4.0)
MCH: 29.4 pg (ref 26.0–34.0)
MCHC: 32.1 g/dL (ref 30.0–36.0)
MCV: 91.6 fL (ref 80.0–100.0)
Monocytes Absolute: 0.6 10*3/uL (ref 0.1–1.0)
Monocytes Relative: 5 %
NEUTROS PCT: 82 %
NRBC: 0 % (ref 0.0–0.2)
Neutro Abs: 10.1 10*3/uL — ABNORMAL HIGH (ref 1.7–7.7)
PLATELETS: 222 10*3/uL (ref 150–400)
RBC: 3.94 MIL/uL (ref 3.87–5.11)
RDW: 12.2 % (ref 11.5–15.5)
WBC: 12.5 10*3/uL — AB (ref 4.0–10.5)

## 2018-07-28 LAB — COMPREHENSIVE METABOLIC PANEL
ALBUMIN: 3.6 g/dL (ref 3.5–5.0)
ALT: 21 U/L (ref 0–44)
ANION GAP: 14 (ref 5–15)
AST: 23 U/L (ref 15–41)
Alkaline Phosphatase: 64 U/L (ref 38–126)
BILIRUBIN TOTAL: 1.8 mg/dL — AB (ref 0.3–1.2)
BUN: 22 mg/dL (ref 8–23)
CO2: 16 mmol/L — ABNORMAL LOW (ref 22–32)
Calcium: 9.3 mg/dL (ref 8.9–10.3)
Chloride: 104 mmol/L (ref 98–111)
Creatinine, Ser: 1.58 mg/dL — ABNORMAL HIGH (ref 0.44–1.00)
GFR calc non Af Amer: 29 mL/min — ABNORMAL LOW (ref >60)
GFR, EST AFRICAN AMERICAN: 34 mL/min — AB (ref >60)
GLUCOSE: 213 mg/dL — AB (ref 70–99)
POTASSIUM: 3.9 mmol/L (ref 3.5–5.1)
SODIUM: 134 mmol/L — AB (ref 135–145)
Total Protein: 6.5 g/dL (ref 6.5–8.1)

## 2018-07-28 LAB — HEMOGLOBIN A1C
Hgb A1c MFr Bld: 6.8 % — ABNORMAL HIGH (ref 4.8–5.6)
MEAN PLASMA GLUCOSE: 148.46 mg/dL

## 2018-07-28 LAB — GLUCOSE, CAPILLARY: GLUCOSE-CAPILLARY: 246 mg/dL — AB (ref 70–99)

## 2018-07-28 LAB — LIPASE, BLOOD: Lipase: 34 U/L (ref 11–51)

## 2018-07-28 LAB — I-STAT TROPONIN, ED: TROPONIN I, POC: 0.01 ng/mL (ref 0.00–0.08)

## 2018-07-28 MED ORDER — ACETAMINOPHEN 500 MG PO TABS
500.0000 mg | ORAL_TABLET | Freq: Once | ORAL | Status: AC
  Administered 2018-07-28: 500 mg via ORAL
  Filled 2018-07-28: qty 1

## 2018-07-28 MED ORDER — DONEPEZIL HCL 10 MG PO TABS
10.0000 mg | ORAL_TABLET | Freq: Every day | ORAL | Status: DC
  Administered 2018-07-28 – 2018-07-30 (×3): 10 mg via ORAL
  Filled 2018-07-28 (×3): qty 1

## 2018-07-28 MED ORDER — DOCUSATE SODIUM 100 MG PO CAPS
100.0000 mg | ORAL_CAPSULE | Freq: Two times a day (BID) | ORAL | Status: DC
  Administered 2018-07-28 – 2018-07-31 (×7): 100 mg via ORAL
  Filled 2018-07-28 (×7): qty 1

## 2018-07-28 MED ORDER — ONDANSETRON HCL 4 MG PO TABS: 4.0000 mg | ORAL_TABLET | Freq: Four times a day (QID) | ORAL | Status: DC | PRN

## 2018-07-28 MED ORDER — CITALOPRAM HYDROBROMIDE 20 MG PO TABS
10.0000 mg | ORAL_TABLET | Freq: Every day | ORAL | Status: DC
  Administered 2018-07-28 – 2018-07-31 (×4): 10 mg via ORAL
  Filled 2018-07-28 (×4): qty 1

## 2018-07-28 MED ORDER — METHYLPREDNISOLONE SODIUM SUCC 125 MG IJ SOLR
125.0000 mg | Freq: Once | INTRAMUSCULAR | Status: AC
  Administered 2018-07-28: 125 mg via INTRAVENOUS
  Filled 2018-07-28: qty 2

## 2018-07-28 MED ORDER — LACTATED RINGERS IV SOLN
INTRAVENOUS | Status: AC
  Administered 2018-07-28 (×2): via INTRAVENOUS

## 2018-07-28 MED ORDER — ONDANSETRON HCL 4 MG/2ML IJ SOLN: 4.0000 mg | Freq: Four times a day (QID) | INTRAMUSCULAR | Status: DC | PRN

## 2018-07-28 MED ORDER — ENOXAPARIN SODIUM 40 MG/0.4ML ~~LOC~~ SOLN
40.0000 mg | SUBCUTANEOUS | Status: DC
  Administered 2018-07-28 – 2018-07-30 (×3): 40 mg via SUBCUTANEOUS
  Filled 2018-07-28 (×4): qty 0.4

## 2018-07-28 MED ORDER — IPRATROPIUM-ALBUTEROL 0.5-2.5 (3) MG/3ML IN SOLN
3.0000 mL | Freq: Once | RESPIRATORY_TRACT | Status: AC
  Administered 2018-07-28: 3 mL via RESPIRATORY_TRACT
  Filled 2018-07-28: qty 3

## 2018-07-28 MED ORDER — NITROFURANTOIN MONOHYD MACRO 100 MG PO CAPS: 100.0000 mg | ORAL_CAPSULE | Freq: Two times a day (BID) | ORAL | Status: DC

## 2018-07-28 MED ORDER — ACETAMINOPHEN 325 MG PO TABS
650.0000 mg | ORAL_TABLET | Freq: Four times a day (QID) | ORAL | Status: DC | PRN
  Administered 2018-07-28 – 2018-07-31 (×6): 650 mg via ORAL
  Filled 2018-07-28 (×6): qty 2

## 2018-07-28 MED ORDER — ROSUVASTATIN CALCIUM 5 MG PO TABS
5.0000 mg | ORAL_TABLET | Freq: Every day | ORAL | Status: DC
  Administered 2018-07-28 – 2018-07-30 (×3): 5 mg via ORAL
  Filled 2018-07-28 (×3): qty 1

## 2018-07-28 MED ORDER — LIDOCAINE 5 % EX PTCH
1.0000 | MEDICATED_PATCH | CUTANEOUS | Status: DC
  Administered 2018-07-28: 1 via TRANSDERMAL
  Administered 2018-07-29: 3 via TRANSDERMAL
  Administered 2018-07-30: 2 via TRANSDERMAL
  Filled 2018-07-28: qty 1
  Filled 2018-07-28: qty 3
  Filled 2018-07-28: qty 2

## 2018-07-28 MED ORDER — IRBESARTAN 300 MG PO TABS
300.0000 mg | ORAL_TABLET | Freq: Every day | ORAL | Status: DC
  Administered 2018-07-28 – 2018-07-31 (×4): 300 mg via ORAL
  Filled 2018-07-28 (×4): qty 1

## 2018-07-28 MED ORDER — AMLODIPINE BESYLATE 10 MG PO TABS
10.0000 mg | ORAL_TABLET | Freq: Every day | ORAL | Status: DC
  Administered 2018-07-28 – 2018-07-31 (×4): 10 mg via ORAL
  Filled 2018-07-28: qty 2
  Filled 2018-07-28 (×3): qty 1

## 2018-07-28 MED ORDER — ACETAMINOPHEN 650 MG RE SUPP: 650.0000 mg | Freq: Four times a day (QID) | RECTAL | Status: DC | PRN

## 2018-07-28 MED ORDER — INSULIN ASPART 100 UNIT/ML ~~LOC~~ SOLN
0.0000 [IU] | Freq: Three times a day (TID) | SUBCUTANEOUS | Status: DC
  Administered 2018-07-29: 2 [IU] via SUBCUTANEOUS
  Administered 2018-07-29: 5 [IU] via SUBCUTANEOUS
  Administered 2018-07-29: 3 [IU] via SUBCUTANEOUS
  Administered 2018-07-30 (×3): 2 [IU] via SUBCUTANEOUS
  Administered 2018-07-31: 3 [IU] via SUBCUTANEOUS
  Administered 2018-07-31: 2 [IU] via SUBCUTANEOUS

## 2018-07-28 MED ORDER — SODIUM CHLORIDE 0.9 % IV SOLN
1.0000 g | INTRAVENOUS | Status: DC
  Administered 2018-07-28: 1 g via INTRAVENOUS
  Filled 2018-07-28: qty 10

## 2018-07-28 NOTE — Progress Notes (Signed)
Patient admitted to 5w bed 30. Patient oriented to self and is able to state that she is in the hospital. Patient repeatedly asking for husband. Patient on 3L [place] without any distress noted. Patient placed on low bed with floor mats. Will continue to monitor and reorient patient.

## 2018-07-28 NOTE — H&P (Signed)
History and Physical    Rachel Rangel AVW:[id number] DOB: 05-Jun-1934 DOA: 07/28/2018  PCP:  [facility] Consultants:  Cardiology; Wert - pulmonology; Lucia Gaskins - neurology Patient coming from: [facility]; [facility]: Daughter, [phone]  Chief Complaint: fall  HPI: Rachel Rangel is a 84 y.o. female with medical history significant of dementia; HLD; Dm; and HTN presenting after a mechanical fall.  She has dementia and had a fall last Thursday evening; she was taken to [facility] and xray/CT did not reveal injuries.  She had elevated WBC and diagnosed UTI; she started them Friday and has continued to take them ass prescribed.  Her daughter is continuing to see a lot of confusion.  She does get confused when she has UTIs but it seemed worse this time.  She fell again this AM when they tried to get her out of bed and [facility] sent her here.  She is complaining of pain in her back and has been since her prior fall.  She has been lying in bed since she returned to her AL facility.  She was found to be hypoxic upon arrival "and the only time she gets oxygen is when she winds up in the hospital."  Friday evening, she was having some wheezing.  Her daughter does not know if they are checking her oxygen sats at the facility.  She was a long-term smoker.  No fevers.  + LE edema.   ED Course: Mechanical fall.  H/o COPD, sats into 90s.  H/o CHF but low suspicion for this.  Maybe new compression fracture.  Also with a fall 5 days ago.  Review of Systems: As per HPI; otherwise review of systems reviewed and negative.   Ambulatory Status:  Ambulates with a walker  Past Medical History:  Diagnosis Date  . Benign hypertension   . Congestive heart failure (CHF) (HCC)    grade 1 diastolic dysfunction in 11/18  . Dementia (HCC)   . Diabetes mellitus without complication (HCC)   . Edema    Left pedal   . History of complete eye exam 12/25/2012   Normal eye exam, no retinopathy [place]  opthalmology  . Hyperlipidemia   . Osteoporosis 2010  . Overactive bladder   . Vitamin D deficiency     Past Surgical History:  Procedure Laterality Date  . ABDOMINAL HYSTERECTOMY  1975   partial    Social History   Socioeconomic History  . Marital status: Widowed    Spouse name: Not on file  . Number of children: 1  . Years of education: College  . Highest education level: Not on file  Occupational History  . Occupation: Retired Data processing manager  . Financial resource strain: Not on file  . Food insecurity:    Worry: Not on file    Inability: Not on file  . Transportation needs:    Medical: Not on file    Non-medical: Not on file  Tobacco Use  . Smoking status: Former Smoker    Packs/day: 1.00    Years: 40.00    Pack years: 40.00    Types: Cigarettes    Last attempt to quit: 09/30/2014    Years since quitting: 3.8  . Smokeless tobacco: Never Used  Substance and Sexual Activity  . Alcohol use: Yes    Alcohol/week: 0.0 standard drinks    Comment: ocass  . Drug use: No  . Sexual activity: Not on file  Lifestyle  . Physical  activity:    Days per week: Not on file    Minutes per session: Not on file  . Stress: Not on file  Relationships  . Social connections:    Talks on phone: Not on file    Gets together: Not on file    Attends religious service: Not on file    Active member of club or organization: Not on file    Attends meetings of clubs or organizations: Not on file    Relationship status: Not on file  . Intimate partner violence:    Fear of current or ex partner: Not on file    Emotionally abused: Not on file    Physically abused: Not on file    Forced sexual activity: Not on file  Other Topics Concern  . Not on file  Social History Narrative   Lives at [place]. Moved there in January 2017.       Caffeine use: ocass    Allergies  Allergen Reactions  . Lipitor [Atorvastatin] Other (See Comments)    Intolerant to 40 MG  due to myalgias, however tolerates 20 MG dose  . Penicillins     Per [facility] and [facility] Has patient had a PCN reaction causing immediate rash, facial/tongue/throat swelling, SOB or lightheadedness with hypotension: Unk Has patient had a PCN reaction causing severe rash involving mucus membranes or skin necrosis: Unk Has patient had a PCN reaction that required hospitalization: Unk Has patient had a PCN reaction occurring within the last 10 years: Unk      Family History  Problem Relation Age of Onset  . Pancreatic cancer Mother   . Glaucoma Mother   . Hypertension Father   . Hypertension Maternal Grandmother   . Glaucoma Maternal Grandfather   . Hypertension Paternal Grandmother   . Hypertension Paternal Grandfather   . Glaucoma Maternal Aunt   . Glaucoma Maternal Aunt   . Dementia Neg Hx     Prior to Admission medications   Medication Sig Start Date End Date Taking? Authorizing Provider  Acetaminophen (APAP 500 PO) Take 500 mg by mouth every 6 (six) hours as needed (pain).    Yes [provider]  amLODipine (NORVASC) 10 MG tablet Take 10 mg by mouth daily. 11/14/15  Yes [provider]  Cholecalciferol (VITAMIN D3) 2000 UNITS capsule Take 2,000 Units by mouth daily.    Yes [provider]  citalopram (CELEXA) 10 MG tablet Take 1 tablet (10 mg total) by mouth daily. 12/25/15  Yes Anson Fret, MD  docusate sodium (COLACE) 100 MG capsule Take 1 capsule (100 mg total) by mouth 2 (two) times daily. Patient taking differently: Take 100 mg by mouth 2 (two) times daily as needed for mild constipation.  08/30/17  Yes Rodolph Bong, MD  donepezil (ARICEPT) 10 MG tablet Take 1 tablet (10 mg total) by mouth at bedtime. Start with 1/2 tablet for 2 weeks then increase to a whole tablet Patient taking differently: Take 10 mg by mouth at bedtime.  12/25/15  Yes Anson Fret, MD  furosemide (LASIX) 20 MG tablet Take 20 mg by mouth 2 (two) times daily.    Yes  [provider]  ibuprofen (ADVIL,MOTRIN) 200 MG tablet Take 200 mg by mouth every 6 (six) hours as needed for mild pain or moderate pain (with food).    Yes [provider]  irbesartan (AVAPRO) 300 MG tablet Take 300 mg by mouth daily.   Yes [provider]  loratadine (CLARITIN) 10 MG tablet Take 10 mg by mouth daily as needed for allergies.   Yes [provider]  metFORMIN (GLUCOPHAGE) 1000 MG tablet Take 1 tablet (1,000 mg total) by mouth 2 (two) times daily with a meal. 08/30/17 08/30/18 Yes Rodolph Bong, MD  Mineral Oil OIL Place 2 drops into both ears every Wednesday.   Yes [provider]  nitrofurantoin, macrocrystal-monohydrate, (MACROBID) 100 MG capsule Take 100 mg by mouth 2 (two) times daily. FOR 10 DAYS 07/24/18  Yes [provider]  nystatin cream (MYCOSTATIN) Apply 1 application topically See admin instructions. Apply to groin and buttocks 2 times a day 12/09/15  Yes [provider]  ondansetron (ZOFRAN) 4 MG tablet Take 4 mg by mouth every 8 (eight) hours as needed for nausea or vomiting.   Yes [provider]  potassium chloride (K-DUR,KLOR-CON) 10 MEQ tablet Take 10 mEq by mouth daily.   Yes [provider]  rosuvastatin (CRESTOR) 5 MG tablet Take 5 mg by mouth at bedtime.    Yes [provider]  cephALEXin (KEFLEX) 500 MG capsule Take 1 capsule (500 mg total) by mouth 2 (two) times daily. Patient not taking: Reported on 07/28/2018 07/24/18   Linwood Dibbles, MD  irbesartan (AVAPRO) 150 MG tablet Take 1 tablet (150 mg total) by mouth daily. Patient not taking: Reported on 07/28/2018 05/08/17   Nyoka Cowden, MD    Physical Exam: Vitals:   07/28/18 1500 07/28/18 1515 07/28/18 1545 07/28/18 1600  BP: (!) 143/66 138/70 138/78 (!) 155/53  Pulse:   64 66  Resp: 19 18 18 13   Temp:      TempSrc:      SpO2:   97% 95%     General:  Appears calm and comfortable and is NAD but is agitated due  to her confusion Eyes:  PERRL, EOMI, normal lids, iris ENT:  Hard of hearing, normal lips & tongue, mildly dry mm Neck:  no LAD, masses or thyromegaly; no carotid bruits Cardiovascular:  RRR, no m/r/g. No LE edema.  Respiratory:   CTA bilaterally with no wheezes/rales/rhonchi.  Normal respiratory effort. Abdomen:  soft, NT, ND, NABS Skin:  no rash or induration seen on limited exam Musculoskeletal:  grossly normal tone BUE/BLE, good ROM, no bony abnormality Lower extremity:  No LE edema.  Limited foot exam with no ulcerations.  2+ distal pulses. Psychiatric:  grossly normal mood and affect, speech fluent and appropriate, AOx3 Neurologic:  CN 2-12 grossly intact, moves all extremities in coordinated fashion, sensation intact    Radiological Exams on Admission: Dg Chest 1 View  Result Date: 07/28/2018 CLINICAL DATA:  Pain following fall EXAM: CHEST  1 VIEW COMPARISON:  July 23, 2018 and September 07, 2015 FINDINGS: There is atelectatic change in the left lower lobe. There is no appreciable edema or consolidation. Heart is upper normal in size with pulmonary vascularity normal. There is aortic atherosclerosis. No adenopathy. There is stable calcification in the right coracoclavicular joint. No pneumothorax. IMPRESSION: Atelectasis left base. Lungs elsewhere clear. Stable cardiac silhouette. There is aortic atherosclerosis. No pneumothorax. Aortic Atherosclerosis (ICD10-I70.0). Electronically Signed   By: Bretta Bang III M.D.   On: 07/28/2018 08:08   Dg Lumbar Spine Complete  Result Date: 07/28/2018 CLINICAL DATA:  Pain following fall EXAM: LUMBAR SPINE - COMPLETE 4+ VIEW COMPARISON:  Lumbar radiographs September 05, 2015 and lumbar CT September 07, 2015 FINDINGS: Frontal, lateral, spot lumbosacral lateral, and bilateral oblique views were obtained. There are  5 non-rib-bearing lumbar type vertebral bodies. There is lumbar dextroscoliosis. Bones are diffusely osteoporotic. Since the 2016  studies, there has been progression of wedging of the T12 vertebral body. There is end plate concavity to varying degrees throughout the lumbar region, most notably at L4, consistent with diffuse osteoporotic type fractures. Similar changes are noted at T11. These fractures must be considered age uncertain. There is no spondylolisthesis. There is moderate disc space narrowing at L3-4 and L4-5. There is facet osteoarthritic change to varying degrees at all levels, most notably at L4-5 and L5-S1 bilaterally. There is aortic atherosclerosis. IMPRESSION: Diffuse osteoporosis. Multiple osteoporotic type fractures throughout the lower thoracic and lumbar region, age uncertain. Extensive wedging of the T12 vertebral body has progressed since the 2016 study. Fractures must be considered age uncertain; an acute bony injury cannot be excluded. No spondylolisthesis. Osteoarthritic change noted at several levels. There is aortic atherosclerosis. Aortic Atherosclerosis (ICD10-I70.0). Electronically Signed   By: Bretta Bang III M.D.   On: 07/28/2018 08:11    EKG: Independently reviewed.  NSR with rate 65;  no evidence of acute ischemia   Labs on Admission: I have personally reviewed the available labs and imaging studies at the time of the admission.  Pertinent labs:   Na++ 134 CO2 16 Glucose 213 BUN 22/Creatinine 1.58/GFR 29 - baseline about 1.1 Bili 1.8 Troponin 0.01 WBC 12.5 Hgb 11.6 Urine culture 10/25 + E coli, pansensitive  Assessment/Plan Principal Problem:   Fall Active Problems:   Diabetes mellitus type 2, controlled, without complications (HCC)   Dementia without behavioral disturbance (HCC)   Essential hypertension   Acute kidney injury superimposed on CKD (HCC)   Acute lower UTI   Hypoxia   Fall -Patient with baseline gait disturbance and recurrent falls, having fallen twice in the last week -She does not have any obvious acute injuries, but she has a number of compression  fractures that may be acute, subacute, or chronic -She does have chronic back pain which is worse now -Will try lidoderm patch -Tramadol may also be a consideration to avoid sedating effects that may make her more likely to fall -PT/OT consults -She is likely appropriate for return to her facility tomorrow; SW consult requested  Hypoxia -patient with long-standing smoking history ad intermittent h/o hypoxia -Found to have hypoxia on presentation to 38s -She does not appear to be having an acute COPD exacerbation -Will request walk of life to determine if patient needs home O2 -CM consult for home O2 -Give Duonebs  Dementia -She is somewhat confused which her daughter reports is worse than baseline, but it is not clear to what extent this is due to dementia -Will follow while addressing her other issues -Continue Aricept  AKI on stage 3 CKD -Chronic CKD with mild AKI -Suspect prerenal azotemia in the setting of dementia and insufficient PO intake -Will gently rehydrate and follow -Hold Lasix  HTN -Continue Norvasc, Avapro  DM -Will check A1c -hold Glucophage -Cover with sensitive-scale SSI  UTI -10/24 culture with pan-sensitive E coli -She was given Macrobid but has a contraindication due to her renal function -Will change to IV Rocephin for now  DVT prophylaxis: Lovenox Code Status: DNR - confirmed with patient/family Family Communication: Daughter was present throughout encounter  Disposition Plan:  Back to ALF once clinically improved Consults called: SW/CM/PT/OT/Dietitian/RT  Admission status: It is my clinical opinion that referral for OBSERVATION is reasonable and necessary in this patient based on the above information provided. The aforementioned taken together  are felt to place the patient at high risk for further clinical deterioration. However it is anticipated that the patient may be medically stable for discharge from the hospital within 24 to 48  hours.    Jonah Blue MD [facility]  If note is complete, please contact covering daytime or nighttime physician. [email or web address] Password [email or web address]  07/28/2018, 5:42 PM

## 2018-07-28 NOTE — ED Triage Notes (Addendum)
Per ems staff was helping patient with her walker to the bathroom and she fell unsure if she hit her head, c/o back pain however when ask if her back was hurting prior to fall she stated how should I know that's a dumb question. C/o nausea with movement.  Patient will say she is hurting then she will say she isn't having any pain. Patient was incont. Of urine and stool on arrival

## 2018-07-28 NOTE — ED Provider Notes (Addendum)
[facility] EMERGENCY DEPARTMENT Provider Note   CSN: [id number] Arrival date & time: 07/28/18  6962     History   Chief Complaint Chief Complaint  Patient presents with  . Fall    HPI Rachel Rangel is a 84 y.o. female. Level 5 caveat due to dementia HPI Patient presents after a fall.  Staff was helping her to the bathroom reportedly fell.  Complaining of low back pain.  Does have a history of back pain however.  Does not think she hit her head but does have a history of dementia.  Not on anticoagulation.  Seen 4 days ago after fall.  Found to have urinary tract infection at that time.  Culture showed E. coli.  For EMS patient has had some 84%.  Improved on oxygen.  Patient has had a cough.  Reportedly had some nausea. Past Medical History:  Diagnosis Date  . Benign hypertension   . Dementia (HCC)   . Diabetes mellitus without complication (HCC)   . Edema    Left pedal   . History of complete eye exam 12/25/2012   Normal eye exam, no retinopathy [facility]  . Hyperlipidemia   . Osteoporosis 2010  . Overactive bladder   . Vitamin D deficiency     Patient Active Problem List   Diagnosis Date Noted  . Hypoxia   . Acute lower UTI 08/29/2017  . Acute renal failure (ARF) (HCC)   . Acute on chronic diastolic CHF (congestive heart failure) (HCC)   . COPD exacerbation (HCC) 08/24/2017  . Essential hypertension 05/09/2017  . Dyspnea on exertion 05/08/2017  . Dementia without behavioral disturbance (HCC) 12/12/2015  . T12 compression fracture (HCC) 09/08/2015  . UTI (lower urinary tract infection) 09/07/2015  . Community acquired pneumonia 09/07/2015  . Back pain 09/07/2015  . Fall 09/07/2015  . Diabetes mellitus type 2, controlled, without complications (HCC) 09/07/2015  . Leukocytosis 09/07/2015  . Acute respiratory failure with hypoxia (HCC) 09/07/2015  . Sepsis (HCC) 09/07/2015    Past Surgical History:  Procedure Laterality Date  .  ABDOMINAL HYSTERECTOMY  1975   partial     OB History   None      Home Medications    Prior to Admission medications   Medication Sig Start Date End Date Taking? Authorizing Provider  Acetaminophen (APAP 500 PO) Take 500 mg by mouth every 6 (six) hours as needed (pain).    Yes [provider]  amLODipine (NORVASC) 10 MG tablet Take 10 mg by mouth daily. 11/14/15  Yes [provider]  Cholecalciferol (VITAMIN D3) 2000 UNITS capsule Take 2,000 Units by mouth daily.    Yes [provider]  citalopram (CELEXA) 10 MG tablet Take 1 tablet (10 mg total) by mouth daily. 12/25/15  Yes Anson Fret, MD  docusate sodium (COLACE) 100 MG capsule Take 1 capsule (100 mg total) by mouth 2 (two) times daily. Patient taking differently: Take 100 mg by mouth 2 (two) times daily as needed for mild constipation.  08/30/17  Yes Rodolph Bong, MD  donepezil (ARICEPT) 10 MG tablet Take 1 tablet (10 mg total) by mouth at bedtime. Start with 1/2 tablet for 2 weeks then increase to a whole tablet Patient taking differently: Take 10 mg by mouth at bedtime.  12/25/15  Yes Anson Fret, MD  furosemide (LASIX) 20 MG tablet Take 20 mg by mouth 2 (two) times daily.    Yes [provider]  ibuprofen (ADVIL,MOTRIN) 200  MG tablet Take 200 mg by mouth every 6 (six) hours as needed for mild pain or moderate pain (with food).    Yes [provider]  irbesartan (AVAPRO) 300 MG tablet Take 300 mg by mouth daily.   Yes [provider]  loratadine (CLARITIN) 10 MG tablet Take 10 mg by mouth daily as needed for allergies.   Yes [provider]  metFORMIN (GLUCOPHAGE) 1000 MG tablet Take 1 tablet (1,000 mg total) by mouth 2 (two) times daily with a meal. 08/30/17 08/30/18 Yes Rodolph Bong, MD  Mineral Oil OIL Place 2 drops into both ears every Wednesday.   Yes [provider]  nitrofurantoin, macrocrystal-monohydrate, (MACROBID) 100 MG capsule Take  100 mg by mouth 2 (two) times daily. FOR 10 DAYS 07/24/18  Yes [provider]  nystatin cream (MYCOSTATIN) Apply 1 application topically See admin instructions. Apply to groin and buttocks 2 times a day 12/09/15  Yes [provider]  ondansetron (ZOFRAN) 4 MG tablet Take 4 mg by mouth every 8 (eight) hours as needed for nausea or vomiting.   Yes [provider]  potassium chloride (K-DUR,KLOR-CON) 10 MEQ tablet Take 10 mEq by mouth daily.   Yes [provider]  rosuvastatin (CRESTOR) 5 MG tablet Take 5 mg by mouth at bedtime.    Yes [provider]  cephALEXin (KEFLEX) 500 MG capsule Take 1 capsule (500 mg total) by mouth 2 (two) times daily. Patient not taking: Reported on 07/28/2018 07/24/18   Linwood Dibbles, MD  irbesartan (AVAPRO) 150 MG tablet Take 1 tablet (150 mg total) by mouth daily. Patient not taking: Reported on 07/28/2018 05/08/17   Nyoka Cowden, MD    Family History Family History  Problem Relation Age of Onset  . Pancreatic cancer Mother   . Glaucoma Mother   . Hypertension Father   . Hypertension Maternal Grandmother   . Glaucoma Maternal Grandfather   . Hypertension Paternal Grandmother   . Hypertension Paternal Grandfather   . Glaucoma Maternal Aunt   . Glaucoma Maternal Aunt   . Dementia Neg Hx     Social History Social History   Tobacco Use  . Smoking status: Former Smoker    Packs/day: 1.00    Years: 40.00    Pack years: 40.00    Types: Cigarettes    Last attempt to quit: 09/30/2014    Years since quitting: 3.8  . Smokeless tobacco: Never Used  Substance Use Topics  . Alcohol use: Yes    Alcohol/week: 0.0 standard drinks    Comment: ocass  . Drug use: No     Allergies   Lipitor [atorvastatin] and Penicillins   Review of Systems Review of Systems  Unable to perform ROS: Dementia     Physical Exam Updated Vital Signs BP (!) 141/60   Pulse (!) 59   Temp 98.1 F (36.7 C) (Oral)   Resp 20   SpO2 98%    Physical Exam  Constitutional: She appears well-developed.  HENT:  Head: Atraumatic.  Eyes:  Pupils are somewhat constricted  Neck: Neck supple.  Cardiovascular: Normal rate.  Pulmonary/Chest:  Mildly harsh breath sounds throughout.  Abdominal: There is tenderness.  Mild diffuse tenderness without ecchymosis.  Musculoskeletal: She exhibits tenderness.  Lumbar spine tenderness without deformity.  Neurological: She is alert.  Patient is alert with some mild dementia.  Per notes this may be at her baseline.  Skin: Skin is warm. Capillary refill takes less than 2 seconds.  ED Treatments / Results  Labs (all labs ordered are listed, but only abnormal results are displayed) Labs Reviewed  COMPREHENSIVE METABOLIC PANEL - Abnormal; Notable for the following components:      Result Value   Sodium 134 (*)    CO2 16 (*)    Glucose, Bld 213 (*)    Creatinine, Ser 1.58 (*)    Total Bilirubin 1.8 (*)    GFR calc non Af Amer 29 (*)    GFR calc Af Amer 34 (*)    All other components within normal limits  CBC WITH DIFFERENTIAL/PLATELET - Abnormal; Notable for the following components:   WBC 12.5 (*)    Hemoglobin 11.6 (*)    Neutro Abs 10.1 (*)    Abs Immature Granulocytes 0.24 (*)    All other components within normal limits  LIPASE, BLOOD  I-STAT TROPONIN, ED    EKG EKG Interpretation  Date/Time:  Tuesday July 28 2018 06:47:37 EDT Ventricular Rate:  65 PR Interval:    QRS Duration: 106 QT Interval:  410 QTC Calculation: 427 R Axis:   57 Text Interpretation:  Sinus rhythm Confirmed by Benjiman Core [phone]) on 07/28/2018 7:14:46 AM   Radiology Dg Chest 1 View  Result Date: 07/28/2018 CLINICAL DATA:  Pain following fall EXAM: CHEST  1 VIEW COMPARISON:  July 23, 2018 and September 07, 2015 FINDINGS: There is atelectatic change in the left lower lobe. There is no appreciable edema or consolidation. Heart is upper normal in size with pulmonary vascularity  normal. There is aortic atherosclerosis. No adenopathy. There is stable calcification in the right coracoclavicular joint. No pneumothorax. IMPRESSION: Atelectasis left base. Lungs elsewhere clear. Stable cardiac silhouette. There is aortic atherosclerosis. No pneumothorax. Aortic Atherosclerosis (ICD10-I70.0). Electronically Signed   By: Bretta Bang III M.D.   On: 07/28/2018 08:08   Dg Lumbar Spine Complete  Result Date: 07/28/2018 CLINICAL DATA:  Pain following fall EXAM: LUMBAR SPINE - COMPLETE 4+ VIEW COMPARISON:  Lumbar radiographs September 05, 2015 and lumbar CT September 07, 2015 FINDINGS: Frontal, lateral, spot lumbosacral lateral, and bilateral oblique views were obtained. There are 5 non-rib-bearing lumbar type vertebral bodies. There is lumbar dextroscoliosis. Bones are diffusely osteoporotic. Since the 2016 studies, there has been progression of wedging of the T12 vertebral body. There is end plate concavity to varying degrees throughout the lumbar region, most notably at L4, consistent with diffuse osteoporotic type fractures. Similar changes are noted at T11. These fractures must be considered age uncertain. There is no spondylolisthesis. There is moderate disc space narrowing at L3-4 and L4-5. There is facet osteoarthritic change to varying degrees at all levels, most notably at L4-5 and L5-S1 bilaterally. There is aortic atherosclerosis. IMPRESSION: Diffuse osteoporosis. Multiple osteoporotic type fractures throughout the lower thoracic and lumbar region, age uncertain. Extensive wedging of the T12 vertebral body has progressed since the 2016 study. Fractures must be considered age uncertain; an acute bony injury cannot be excluded. No spondylolisthesis. Osteoarthritic change noted at several levels. There is aortic atherosclerosis. Aortic Atherosclerosis (ICD10-I70.0). Electronically Signed   By: Bretta Bang III M.D.   On: 07/28/2018 08:11    Procedures Procedures (including  critical care time)  Medications Ordered in ED Medications  ipratropium-albuterol (DUONEB) 0.5-2.5 (3) MG/3ML nebulizer solution 3 mL (has no administration in time range)     Initial Impression / Assessment and Plan / ED Course  I have reviewed the triage vital signs and the nursing notes.  Pertinent labs & imaging results that were  available during my care of the patient were reviewed by me and considered in my medical decision making (see chart for details).     Patient presents after a fall.  Has low back pain.  X-ray showed possibly acute lumbar thoracic compression fracture.  Is somewhat tender at this site although does have chronic back pain.  Also found to be hypoxic.  History of COPD.  Requiring oxygen, which is new for the patient.  Chest x-ray shows only atelectasis.  With hypoxia patient may benefit from admission to hospital. Also patient is on Keflex for urinary tract infection.  Culture did show susceptibility.  Final Clinical Impressions(s) / ED Diagnoses   Final diagnoses:  Fall, initial encounter  Hypoxia  Urinary tract infection without hematuria, site unspecified  Compression fracture of lumbar vertebra, initial encounter, unspecified lumbar vertebral level [facility])    ED Discharge Orders    None       Benjiman Core, MD 07/28/18 [id number]    Benjiman Core, MD 07/28/18 [phone]

## 2018-07-28 NOTE — ED Notes (Signed)
Pt resting in bed, appears to be in NAD. Pt constantly speaking to "her husband" No one is in room. Pt has dementia. Breathing is unlabored at this time. VSS.

## 2018-07-28 NOTE — ED Notes (Signed)
Pt desat while transported to xray on the monitor, this RN called and requested Xray staff  Place the pt on O2 [place] 3 L

## 2018-07-29 DIAGNOSIS — G308 Other Alzheimer's disease: Secondary | ICD-10-CM

## 2018-07-29 DIAGNOSIS — N390 Urinary tract infection, site not specified: Secondary | ICD-10-CM | POA: Diagnosis not present

## 2018-07-29 DIAGNOSIS — B9620 Unspecified Escherichia coli [E. coli] as the cause of diseases classified elsewhere: Secondary | ICD-10-CM | POA: Diagnosis not present

## 2018-07-29 DIAGNOSIS — N189 Chronic kidney disease, unspecified: Secondary | ICD-10-CM | POA: Diagnosis not present

## 2018-07-29 DIAGNOSIS — W19XXXA Unspecified fall, initial encounter: Secondary | ICD-10-CM

## 2018-07-29 DIAGNOSIS — J9601 Acute respiratory failure with hypoxia: Secondary | ICD-10-CM | POA: Diagnosis not present

## 2018-07-29 DIAGNOSIS — R296 Repeated falls: Secondary | ICD-10-CM | POA: Diagnosis not present

## 2018-07-29 DIAGNOSIS — N179 Acute kidney failure, unspecified: Secondary | ICD-10-CM | POA: Diagnosis not present

## 2018-07-29 DIAGNOSIS — I10 Essential (primary) hypertension: Secondary | ICD-10-CM

## 2018-07-29 DIAGNOSIS — S32000A Wedge compression fracture of unspecified lumbar vertebra, initial encounter for closed fracture: Secondary | ICD-10-CM

## 2018-07-29 DIAGNOSIS — F0280 Dementia in other diseases classified elsewhere without behavioral disturbance: Secondary | ICD-10-CM

## 2018-07-29 LAB — BASIC METABOLIC PANEL
ANION GAP: 8 (ref 5–15)
BUN: 24 mg/dL — ABNORMAL HIGH (ref 8–23)
CHLORIDE: 104 mmol/L (ref 98–111)
CO2: 22 mmol/L (ref 22–32)
CREATININE: 1.47 mg/dL — AB (ref 0.44–1.00)
Calcium: 9.6 mg/dL (ref 8.9–10.3)
GFR calc non Af Amer: 32 mL/min — ABNORMAL LOW (ref >60)
GFR, EST AFRICAN AMERICAN: 37 mL/min — AB (ref >60)
Glucose, Bld: 193 mg/dL — ABNORMAL HIGH (ref 70–99)
POTASSIUM: 4.2 mmol/L (ref 3.5–5.1)
Sodium: 134 mmol/L — ABNORMAL LOW (ref 135–145)

## 2018-07-29 LAB — CBC
HEMATOCRIT: 31.5 % — AB (ref 36.0–46.0)
HEMOGLOBIN: 10.4 g/dL — AB (ref 12.0–15.0)
MCH: 29.5 pg (ref 26.0–34.0)
MCHC: 33 g/dL (ref 30.0–36.0)
MCV: 89.2 fL (ref 80.0–100.0)
NRBC: 0 % (ref 0.0–0.2)
Platelets: 193 10*3/uL (ref 150–400)
RBC: 3.53 MIL/uL — AB (ref 3.87–5.11)
RDW: 12.1 % (ref 11.5–15.5)
WBC: 11.9 10*3/uL — ABNORMAL HIGH (ref 4.0–10.5)

## 2018-07-29 LAB — GLUCOSE, CAPILLARY
GLUCOSE-CAPILLARY: 296 mg/dL — AB (ref 70–99)
GLUCOSE-CAPILLARY: 154 mg/dL — AB (ref 70–99)
Glucose-Capillary: 160 mg/dL — ABNORMAL HIGH (ref 70–99)
Glucose-Capillary: 170 mg/dL — ABNORMAL HIGH (ref 70–99)

## 2018-07-29 MED ORDER — CEFDINIR 300 MG PO CAPS
300.0000 mg | ORAL_CAPSULE | Freq: Two times a day (BID) | ORAL | Status: DC
  Administered 2018-07-29 – 2018-07-31 (×5): 300 mg via ORAL
  Filled 2018-07-29 (×5): qty 1

## 2018-07-29 MED ORDER — ENSURE ENLIVE PO LIQD
237.0000 mL | Freq: Two times a day (BID) | ORAL | Status: DC
  Administered 2018-07-29 – 2018-07-31 (×5): 237 mL via ORAL

## 2018-07-29 NOTE — Evaluation (Addendum)
Occupational Therapy Evaluation Patient Details Name: Rachel Rangel MRN: [id number] DOB: [birth year] Today's Date: 07/29/2018    History of Present Illness 84 y.o. female with medical history significant of dementia, HLD, Dm, and HTN and prresenting after a mechanical fall. Imaging of Lumbar region showing Multiple osteoporotic type fractures throughout the lower thoracic and lumbar region, age uncertain. Pt with fall last Thursday 07/23/18 and xray/CT did not reveal injuries.    Clinical Impression   Per chart review, pt was residing at [facility] and used walker for functional mobility. Pt oriented to self, place of "hospital", and month "October". She currently requires Max-Total A for ADLs, bed mobility, and sit<>stand transfer. Pt presenting with high fear of falling, decreased strength and balance, and significant pain. Pt would benefit from further acute OT to facilitate safe dc. Recommend dc to SNF for further OT to optimize safety, independence with ADLs, and return to PLOF.     Follow Up Recommendations  SNF;Supervision/Assistance - 24 hour    Equipment Recommendations  Other (comment)(Defer to next venue)    Recommendations for Other Services PT consult     Precautions / Restrictions Precautions Precautions: Fall      Mobility Bed Mobility Overal bed mobility: Needs Assistance Bed Mobility: Rolling;Sidelying to Sit;Sit to Sidelying Rolling: Max assist Sidelying to sit: Total assist     Sit to sidelying: Total assist;+2 for safety/equipment General bed mobility comments: Max A to roll to right and then total A to bring legs off EOB and then elevate trunk.  Transfers Overall transfer level: Needs assistance   Transfers: Sit to/from Stand Sit to Stand: Max assist;Total assist         General transfer comment: Max A to power up into standing. Pt requiring Total A for maintain standing. performed twice    Balance Overall balance assessment: Needs  assistance Sitting-balance support: Feet supported;Bilateral upper extremity supported Sitting balance-Leahy Scale: Poor     Standing balance support: No upper extremity supported;During functional activity Standing balance-Leahy Scale: Zero Standing balance comment: Requiring total A to maintain standing.                            ADL either performed or assessed with clinical judgement   ADL Overall ADL's : Needs assistance/impaired Eating/Feeding: Minimal assistance;Bed level Eating/Feeding Details (indicate cue type and reason): cues to initate task and continue. Min A for placing food on fork. Pt then able to bring to mouth while in bed. Positioning pt in bed to optimizing upright posture.  Grooming: Minimal assistance;Bed level   Upper Body Bathing: Maximal assistance;Bed level   Lower Body Bathing: Maximal assistance;Bed level   Upper Body Dressing : Maximal assistance;Bed level   Lower Body Dressing: Bed level;Total assistance Lower Body Dressing Details (indicate cue type and reason): Total A for donning socks             Functional mobility during ADLs: Maximal assistance;Total assistance(sit<>stand only) General ADL Comments: Pt requiring Max-Total A for ADLs due to cognitive deficits and pain. Pt able to self feed herself breakfast with cues for participation.      Vision         Perception     Praxis      Pertinent Vitals/Pain       Hand Dominance     Extremity/Trunk Assessment Upper Extremity Assessment Upper Extremity Assessment: Generalized weakness   Lower Extremity Assessment Lower Extremity Assessment: Generalized weakness  Cervical / Trunk Assessment Cervical / Trunk Assessment: Other exceptions Cervical / Trunk Exceptions: Imaging of Lumbar region showing Multiple osteoporotic type fractures throughout the lower thoracic and lumbar region, age uncertain.   Communication Communication Communication: HOH   Cognition  Arousal/Alertness: Awake/alert Behavior During Therapy: Anxious Overall Cognitive Status: History of cognitive impairments - at baseline                                 General Comments: Pt with baseline dementia. Oriented to self, place, and month. Required cues to oriented to reason for admission. When asked who she lives with at home pt stating "my parents."   General Comments  Pt very anxious about mobility and stating "I am falling!" throughout OOB activity. Pt also presenting with significant pain. SpO2 dropping to 88% on RA. placing pt back on 3L O2 and stayed in 90s.    Exercises     Shoulder Instructions      Home Living Family/patient expects to be discharged to:: Assisted living                                 Additional Comments: Per chart review and RN, Pt resides at [facility]      Prior Functioning/Environment                   OT Problem List: Decreased strength;Decreased range of motion;Decreased activity tolerance;Impaired balance (sitting and/or standing);Decreased safety awareness;Decreased knowledge of use of DME or AE;Decreased knowledge of precautions;Pain      OT Treatment/Interventions: Self-care/ADL training;Therapeutic exercise;Energy conservation;DME and/or AE instruction;Therapeutic activities;Patient/family education    OT Goals(Current goals can be found in the care plan section) Acute Rehab OT Goals Patient Stated Goal: Stop pain OT Goal Formulation: With patient Time For Goal Achievement: 08/12/18 Potential to Achieve Goals: Good  OT Frequency: Min 2X/week   Barriers to D/C:            Co-evaluation              AM-PAC PT "6 Clicks" Daily Activity     Outcome Measure Help from another person eating meals?: A Little Help from another person taking care of personal grooming?: A Little Help from another person toileting, which includes using toliet, bedpan, or urinal?: Total Help from another  person bathing (including washing, rinsing, drying)?: A Lot Help from another person to put on and taking off regular upper body clothing?: A Lot Help from another person to put on and taking off regular lower body clothing?: Total 6 Click Score: 12   End of Session Equipment Utilized During Treatment: Gait belt;Oxygen Nurse Communication: Mobility status;Precautions  Activity Tolerance: Patient limited by pain Patient left: in bed;with call bell/phone within reach;with bed alarm set;with family/visitor present  OT Visit Diagnosis: Unsteadiness on feet (R26.81);Other abnormalities of gait and mobility (R26.89);Muscle weakness (generalized) (M62.81);Pain Pain - part of body: (Back)                Time: 1610-9604 OT Time Calculation (min): 24 min Charges:  OT General Charges $OT Visit: 1 Visit OT Evaluation $OT Eval Moderate Complexity: 1 Mod OT Treatments $Self Care/Home Management : 8-22 mins  Aiven Kampe MSOT, OTR/L Acute Rehab Pager: [phone] Office: [phone]  Theodoro Grist Jamarkis Branam 07/29/2018, 2:27 PM

## 2018-07-29 NOTE — NC FL2 (Addendum)
[place] MEDICAID FL2 LEVEL OF CARE SCREENING TOOL     IDENTIFICATION  Patient Name: Rachel Rangel Birthdate: 03-Sep-1934 Sex: female Admission Date (Current Location): 07/28/2018  [facility] and [place] Number:  Producer, television/film/video and Address:  [facility] [place], [address], [place], [place] [address]      Provider Number: [id number]  Attending Physician Name and Address:  Maretta Bees, MD  Relative Name and Phone Number:  Lynden Ang, daughter, [phone]    Current Level of Care: Hospital Recommended Level of Care: Skilled Nursing Facility Prior Approval Number:    Date Approved/Denied:   PASRR Number:   [id number]   Discharge Plan: SNF    Current Diagnoses: Patient Active Problem List   Diagnosis Date Noted  . Hypoxia   . Acute lower UTI 08/29/2017  . Acute kidney injury superimposed on CKD (HCC)   . Acute on chronic diastolic CHF (congestive heart failure) (HCC)   . COPD exacerbation (HCC) 08/24/2017  . Essential hypertension 05/09/2017  . Dyspnea on exertion 05/08/2017  . Dementia without behavioral disturbance (HCC) 12/12/2015  . T12 compression fracture (HCC) 09/08/2015  . UTI (lower urinary tract infection) 09/07/2015  . Community acquired pneumonia 09/07/2015  . Back pain 09/07/2015  . Fall 09/07/2015  . Diabetes mellitus type 2, controlled, without complications (HCC) 09/07/2015  . Leukocytosis 09/07/2015  . Acute respiratory failure with hypoxia (HCC) 09/07/2015  . Sepsis (HCC) 09/07/2015    Orientation RESPIRATION BLADDER Height & Weight     Self  Normal Incontinent Weight:   Height:     BEHAVIORAL SYMPTOMS/MOOD NEUROLOGICAL BOWEL NUTRITION STATUS      Continent Diet(Please see DC Summary)  AMBULATORY STATUS COMMUNICATION OF NEEDS Skin   Extensive Assist Verbally Normal                       Personal Care Assistance Level of Assistance  Bathing, Feeding, Dressing Bathing Assistance: Maximum  assistance Feeding assistance: Limited assistance Dressing Assistance: Limited assistance     Functional Limitations Info  Hearing   Hearing Info: Impaired      SPECIAL CARE FACTORS FREQUENCY  PT (By licensed PT), OT (By licensed OT)     PT Frequency: 5x/week OT Frequency: 3x/week            Contractures Contractures Info: Not present    Additional Factors Info  Code Status, Allergies, Insulin Sliding Scale Code Status Info: DNR Allergies Info: Allergies:  Lipitor Atorvastatin, Penicillins   Insulin Sliding Scale Info: Novolog       Current Medications (07/29/2018):  This is the current hospital active medication list Current Facility-Administered Medications  Medication Dose Route Frequency Provider Last Rate Last Dose  . acetaminophen (TYLENOL) tablet 650 mg  650 mg Oral Q6H PRN Jonah Blue, MD   650 mg at 07/29/18 1222   Or  . acetaminophen (TYLENOL) suppository 650 mg  650 mg Rectal Q6H PRN Jonah Blue, MD      . amLODipine (NORVASC) tablet 10 mg  10 mg Oral Daily Jonah Blue, MD   10 mg at 07/29/18 0836  . cefdinir (OMNICEF) capsule 300 mg  300 mg Oral Q12H Ghimire, Werner Lean, MD   300 mg at 07/29/18 1503  . citalopram (CELEXA) tablet 10 mg  10 mg Oral Daily Jonah Blue, MD   10 mg at 07/29/18 0835  . docusate sodium (COLACE) capsule 100 mg  100 mg Oral BID Jonah Blue, MD   100  mg at 07/29/18 0836  . donepezil (ARICEPT) tablet 10 mg  10 mg Oral Noemi Chapel, MD   10 mg at 07/28/18 2122  . enoxaparin (LOVENOX) injection 40 mg  40 mg Subcutaneous Q24H Jonah Blue, MD   40 mg at 07/28/18 1726  . feeding supplement (ENSURE ENLIVE) (ENSURE ENLIVE) liquid 237 mL  237 mL Oral BID BM Maretta Bees, MD   237 mL at 07/29/18 1503  . insulin aspart (novoLOG) injection 0-9 Units  0-9 Units Subcutaneous TID WC Jonah Blue, MD   3 Units at 07/29/18 1503  . irbesartan (AVAPRO) tablet 300 mg  300 mg Oral Daily Jonah Blue, MD   300 mg at  07/29/18 0836  . lidocaine (LIDODERM) 5 % 1-3 patch  1-3 patch Transdermal Q24H Jonah Blue, MD   1 patch at 07/28/18 1846  . ondansetron (ZOFRAN) tablet 4 mg  4 mg Oral Q6H PRN Jonah Blue, MD       Or  . ondansetron [facility]) injection 4 mg  4 mg Intravenous Q6H PRN Jonah Blue, MD      . rosuvastatin (CRESTOR) tablet 5 mg  5 mg Oral QHS Jonah Blue, MD   5 mg at 07/28/18 2123     Discharge Medications: Please see discharge summary for a list of discharge medications.  Relevant Imaging Results:  Relevant Lab Results:   Additional Information SSN: [id number]     Mearl Latin, LCSW

## 2018-07-29 NOTE — Progress Notes (Signed)
PROGRESS NOTE        PATIENT DETAILS Name: Rachel Rangel Age: 84 y.o. Sex: female Date of Birth: February 29, 1934 Admit Date: 07/28/2018 Admitting Physician Jonah Blue, MD ZOX:[id number], Onalee Hua, MD  Brief Narrative: Patient is a 84 y.o. female with prior history of dementia, dyslipidemia, DM-2, hypertension presenting to the hospital with a mechanical fall.  She was recently diagnosed with a UTI after another fall.  She was subsequently admitted to the hospitalist service for further evaluation and treatment.  Subjective: Awake and alert-at times appears confused.  Follows most of my commands-and answers most of my actions appropriately.  No family at bedside this morning.  Assessment/Plan: Mechanical fall: Has recurrent falls-I suspect this is secondary to gait disturbance in the setting of dementia.  Await evaluation by rehab services-but suspect patient needs SNF on discharge.  Acute hypoxic respiratory failure: Per H&P-O2 saturation on admission was down to the 80s range-her lungs are completely clear-chest x-ray does not show any major abnormalities.  Per H&P-patient has had intermittent history of hypoxia in the past-and does carry a diagnosis of COPD.  She does not appear to have any excess volume on board-will ask nursing staff to see if oxygen can be titrated off.  At this time I doubt any further work-up is required-as she is comfortable and not in any sort of distress.  She also does not complain of shortness of breath..  AKI on CKD stage III: AKI likely hemodynamically mediated-improving with supportive care.  Avoid nephrotoxic agents.  UTI: Recent urine culture on 10/24 was positive for pansensitive E. coli-apparently she was on Macrobid-do not think she requires IV Rocephin-we will transition to [facility].  Suspect this is more of cystitis rather than pyelonephritis-hence we will plan on 3 days of antimicrobial treatment.  Hypertension: Controlled continue  Norvasc and Avapro  DM-2: CBG stable with SSI.  Hold metformin.  Dyslipidemia: Continue statin  Dementia: Pleasantly confused-think she is not that far from her usual baseline-continue Aricept and Celexa.  DVT Prophylaxis: Prophylactic Lovenox  Code Status: DNR  Family Communication: None at bedside  Disposition Plan: Remain inpatient- SNF on discharge-await evaluation by rehab services.  Antimicrobial agents: Anti-infectives (From admission, onward)   Start     Dose/Rate Route Frequency Ordered Stop   07/29/18 1300  cefdinir (OMNICEF) capsule 300 mg     300 mg Oral Every 12 hours 07/29/18 1257     07/28/18 1600  cefTRIAXone (ROCEPHIN) 1 g in sodium chloride 0.9 % 100 mL IVPB  Status:  Discontinued     1 g 200 mL/hr over 30 Minutes Intravenous Every 24 hours 07/28/18 1550 07/29/18 1257      Procedures: None  CONSULTS:  None  Time spent: 25- minutes-Greater than 50% of this time was spent in counseling, explanation of diagnosis, planning of further management, and coordination of care.  MEDICATIONS: Scheduled Meds: . amLODipine  10 mg Oral Daily  . cefdinir  300 mg Oral Q12H  . citalopram  10 mg Oral Daily  . docusate sodium  100 mg Oral BID  . donepezil  10 mg Oral QHS  . enoxaparin (LOVENOX) injection  40 mg Subcutaneous Q24H  . feeding supplement (ENSURE ENLIVE)  237 mL Oral BID BM  . insulin aspart  0-9 Units Subcutaneous TID WC  . irbesartan  300 mg Oral Daily  . lidocaine  1-3  patch Transdermal Q24H  . rosuvastatin  5 mg Oral QHS   Continuous Infusions: PRN Meds:.acetaminophen **OR** acetaminophen, ondansetron **OR** ondansetron (ZOFRAN) IV   PHYSICAL EXAM: Vital signs: Vitals:   07/28/18 1545 07/28/18 1600 07/28/18 2228 07/29/18 0708  BP: 138/78 (!) 155/53 140/66 (!) 161/83  Pulse: 64 66 (!) 57 (!) 55  Resp: 18 13 17 17   Temp:   97.6 F (36.4 C) 98.2 F (36.8 C)  TempSrc:    Oral  SpO2: 97% 95% 94% 92%   There were no vitals filed for this  visit. There is no height or weight on file to calculate BMI.   General appearance :Awake, alert-but pleasantly confused at times, not in any distress.  HEENT: Atraumatic and Normocephalic Neck: supple Resp:Good air entry bilaterally, no added sounds  CVS: S1 S2 regular GI: Bowel sounds present, Non tender and not distended with no gaurding, rigidity or rebound. Extremities: B/L Lower Ext shows no edema, both legs are warm to touch Neurology:  Non focal Musculoskeletal:No digital cyanosis Skin:No Rash, warm and dry Wounds:N/A  I have personally reviewed following labs and imaging studies  LABORATORY DATA: CBC: Recent Labs  Lab 07/23/18 2150 07/28/18 0649 07/29/18 0411  WBC 15.4* 12.5* 11.9*  NEUTROABS 12.6* 10.1*  --   HGB 12.0 11.6* 10.4*  HCT 37.6 36.1 31.5*  MCV 94.0 91.6 89.2  PLT 270 222 193    Basic Metabolic Panel: Recent Labs  Lab 07/23/18 2150 07/28/18 0649 07/29/18 0411  NA 136 134* 134*  K 4.5 3.9 4.2  CL 99 104 104  CO2 23 16* 22  GLUCOSE 243* 213* 193*  BUN 28* 22 24*  CREATININE 1.85* 1.58* 1.47*  CALCIUM 10.4* 9.3 9.6    GFR: CrCl cannot be calculated (Unknown ideal weight.).  Liver Function Tests: Recent Labs  Lab 07/23/18 2150 07/28/18 0649  AST 17 23  ALT 15 21  ALKPHOS 70 64  BILITOT 0.9 1.8*  PROT 7.9 6.5  ALBUMIN 4.4 3.6   Recent Labs  Lab 07/23/18 2150 07/28/18 0649  LIPASE 43 34   No results for input(s): AMMONIA in the last 168 hours.  Coagulation Profile: No results for input(s): INR, PROTIME in the last 168 hours.  Cardiac Enzymes: No results for input(s): CKTOTAL, CKMB, CKMBINDEX, TROPONINI in the last 168 hours.  BNP (last 3 results) No results for input(s): PROBNP in the last 8760 hours.  HbA1C: Recent Labs    07/28/18 1823  HGBA1C 6.8*    CBG: Recent Labs  Lab 07/28/18 2225 07/29/18 0750 07/29/18 1349  GLUCAP 246* 160* 170*    Lipid Profile: No results for input(s): CHOL, HDL, LDLCALC, TRIG,  CHOLHDL, LDLDIRECT in the last 72 hours.  Thyroid Function Tests: No results for input(s): TSH, T4TOTAL, FREET4, T3FREE, THYROIDAB in the last 72 hours.  Anemia Panel: No results for input(s): VITAMINB12, FOLATE, FERRITIN, TIBC, IRON, RETICCTPCT in the last 72 hours.  Urine analysis:    Component Value Date/Time   COLORURINE YELLOW 07/23/2018 2150   APPEARANCEUR CLEAR 07/23/2018 2150   LABSPEC 1.013 07/23/2018 2150   PHURINE 5.0 07/23/2018 2150   GLUCOSEU 50 (A) 07/23/2018 2150   HGBUR NEGATIVE 07/23/2018 2150   BILIRUBINUR NEGATIVE 07/23/2018 2150   KETONESUR NEGATIVE 07/23/2018 2150   PROTEINUR NEGATIVE 07/23/2018 2150   NITRITE NEGATIVE 07/23/2018 2150   LEUKOCYTESUR SMALL (A) 07/23/2018 2150    Sepsis Labs: Lactic Acid, Venous    Component Value Date/Time   LATICACIDVEN 1.46 09/07/2015 1649  MICROBIOLOGY: Recent Results (from the past 240 hour(s))  Urine Culture     Status: Abnormal   Collection Time: 07/24/18 12:23 AM  Result Value Ref Range Status   Specimen Description   Final    URINE, CLEAN CATCH Performed at [facility], [address] [place], [place] [address]    Special Requests   Final    NONE Performed at [facility], [address], [place], [place] [address]    Culture >=100,000 COLONIES/mL ESCHERICHIA COLI (A)  Final   Report Status 07/26/2018 FINAL  Final   Organism ID, Bacteria ESCHERICHIA COLI (A)  Final      Susceptibility   Escherichia coli - MIC*    AMPICILLIN <=2 SENSITIVE Sensitive     CEFAZOLIN <=4 SENSITIVE Sensitive     CEFTRIAXONE <=1 SENSITIVE Sensitive     CIPROFLOXACIN <=0.25 SENSITIVE Sensitive     GENTAMICIN <=1 SENSITIVE Sensitive     IMIPENEM <=0.25 SENSITIVE Sensitive     NITROFURANTOIN <=16 SENSITIVE Sensitive     TRIMETH/SULFA <=20 SENSITIVE Sensitive     AMPICILLIN/SULBACTAM <=2 SENSITIVE Sensitive     PIP/TAZO <=4 SENSITIVE Sensitive     Extended ESBL NEGATIVE Sensitive      * >=100,000 COLONIES/mL ESCHERICHIA COLI    RADIOLOGY STUDIES/RESULTS: Dg Chest 1 View  Result Date: 07/28/2018 CLINICAL DATA:  Pain following fall EXAM: CHEST  1 VIEW COMPARISON:  July 23, 2018 and September 07, 2015 FINDINGS: There is atelectatic change in the left lower lobe. There is no appreciable edema or consolidation. Heart is upper normal in size with pulmonary vascularity normal. There is aortic atherosclerosis. No adenopathy. There is stable calcification in the right coracoclavicular joint. No pneumothorax. IMPRESSION: Atelectasis left base. Lungs elsewhere clear. Stable cardiac silhouette. There is aortic atherosclerosis. No pneumothorax. Aortic Atherosclerosis (ICD10-I70.0). Electronically Signed   By: Bretta Bang III M.D.   On: 07/28/2018 08:08   Dg Ribs Unilateral W/chest Right  Result Date: 07/23/2018 CLINICAL DATA:  Fall at nursing home. Right posterior lower rib pain. EXAM: RIGHT RIBS AND CHEST - 3+ VIEW COMPARISON:  Chest radiograph 08/24/2017 FINDINGS: Technically limited exam given patient's medical condition, unable to move position. Additionally soft tissue attenuation from habitus and bony under mineralization further limits assessment. Particularly, lower ribs are not well evaluated. No visualized rib fractures. No pulmonary complication such as pneumothorax or pleural effusion. No focal airspace disease. Cardiomegaly is grossly similar allowing for differences in technique. There is aortic atherosclerosis. Vascular congestion versus bronchovascular crowding. IMPRESSION: 1. No visualized rib fractures on limited exam. Patient's diminished mobility and soft tissue attenuation from habitus limits assessment, particularly of the lower ribs. 2. Low lung volumes with vascular congestion versus bronchovascular crowding. Cardiomegaly is similar to exam last year. Electronically Signed   By: Narda Rutherford M.D.   On: 07/23/2018 21:34   Dg Lumbar Spine Complete  Result Date:  07/28/2018 CLINICAL DATA:  Pain following fall EXAM: LUMBAR SPINE - COMPLETE 4+ VIEW COMPARISON:  Lumbar radiographs September 05, 2015 and lumbar CT September 07, 2015 FINDINGS: Frontal, lateral, spot lumbosacral lateral, and bilateral oblique views were obtained. There are 5 non-rib-bearing lumbar type vertebral bodies. There is lumbar dextroscoliosis. Bones are diffusely osteoporotic. Since the 2016 studies, there has been progression of wedging of the T12 vertebral body. There is end plate concavity to varying degrees throughout the lumbar region, most notably at L4, consistent with diffuse osteoporotic type fractures. Similar changes are noted at T11. These fractures must  be considered age uncertain. There is no spondylolisthesis. There is moderate disc space narrowing at L3-4 and L4-5. There is facet osteoarthritic change to varying degrees at all levels, most notably at L4-5 and L5-S1 bilaterally. There is aortic atherosclerosis. IMPRESSION: Diffuse osteoporosis. Multiple osteoporotic type fractures throughout the lower thoracic and lumbar region, age uncertain. Extensive wedging of the T12 vertebral body has progressed since the 2016 study. Fractures must be considered age uncertain; an acute bony injury cannot be excluded. No spondylolisthesis. Osteoarthritic change noted at several levels. There is aortic atherosclerosis. Aortic Atherosclerosis (ICD10-I70.0). Electronically Signed   By: Bretta Bang III M.D.   On: 07/28/2018 08:11   Ct Head Wo Contrast  Result Date: 07/23/2018 CLINICAL DATA:  Fall. EXAM: CT HEAD WITHOUT CONTRAST CT CERVICAL SPINE WITHOUT CONTRAST TECHNIQUE: Multidetector CT imaging of the head and cervical spine was performed following the standard protocol without intravenous contrast. Multiplanar CT image reconstructions of the cervical spine were also generated. COMPARISON:  None. FINDINGS: CT HEAD FINDINGS Brain: There is severe atrophy and chronic small vessel disease changes.  No acute intracranial abnormality. Specifically, no hemorrhage, hydrocephalus, mass lesion, acute infarction, or significant intracranial injury. Vascular: No hyperdense vessel or unexpected calcification. Skull: No acute calvarial abnormality. Sinuses/Orbits: Visualized paranasal sinuses and mastoids clear. Orbital soft tissues unremarkable. Other: None CT CERVICAL SPINE FINDINGS Alignment: No subluxation Skull base and vertebrae: No fracture Soft tissues and spinal canal: Prevertebral soft tissues are normal. No epidural or paraspinal hematoma. Disc levels:  Diffuse degenerative disc and facet disease. Upper chest: No acute findings Other: Diffuse osteopenia. IMPRESSION: Severe chronic small vessel disease and cerebral atrophy. No acute intracranial abnormality. Osteopenia. Diffuse degenerative disc and facet disease. No acute bony abnormality in the cervical spine. Electronically Signed   By: Charlett Nose M.D.   On: 07/23/2018 22:19   Ct Cervical Spine Wo Contrast  Result Date: 07/23/2018 CLINICAL DATA:  Fall. EXAM: CT HEAD WITHOUT CONTRAST CT CERVICAL SPINE WITHOUT CONTRAST TECHNIQUE: Multidetector CT imaging of the head and cervical spine was performed following the standard protocol without intravenous contrast. Multiplanar CT image reconstructions of the cervical spine were also generated. COMPARISON:  None. FINDINGS: CT HEAD FINDINGS Brain: There is severe atrophy and chronic small vessel disease changes. No acute intracranial abnormality. Specifically, no hemorrhage, hydrocephalus, mass lesion, acute infarction, or significant intracranial injury. Vascular: No hyperdense vessel or unexpected calcification. Skull: No acute calvarial abnormality. Sinuses/Orbits: Visualized paranasal sinuses and mastoids clear. Orbital soft tissues unremarkable. Other: None CT CERVICAL SPINE FINDINGS Alignment: No subluxation Skull base and vertebrae: No fracture Soft tissues and spinal canal: Prevertebral soft tissues  are normal. No epidural or paraspinal hematoma. Disc levels:  Diffuse degenerative disc and facet disease. Upper chest: No acute findings Other: Diffuse osteopenia. IMPRESSION: Severe chronic small vessel disease and cerebral atrophy. No acute intracranial abnormality. Osteopenia. Diffuse degenerative disc and facet disease. No acute bony abnormality in the cervical spine. Electronically Signed   By: Charlett Nose M.D.   On: 07/23/2018 22:19     LOS: 0 days   Jeoffrey Massed, MD  [facility]  If 7PM-7AM, please contact night-coverage  Please page via [email or web address] [email or web address]-click on MD name and type text message  07/29/2018, 2:39 PM

## 2018-07-29 NOTE — Evaluation (Signed)
Physical Therapy Evaluation Patient Details Name: Rachel Rangel MRN: [id number] DOB: [birth year] Today's Date: 07/29/2018   History of Present Illness  84 y.o. female with medical history significant of dementia, HLD, Dm, and HTN and prresenting after a mechanical fall. Imaging of Lumbar region showing Multiple osteoporotic type fractures throughout the lower thoracic and lumbar region, age uncertain. Pt with fall last Thursday 07/23/18 and xray/CT did not reveal injuries.  Clinical Impression  Pt admitted with/for fall with back pain.  Imaging revealing age indeterminate compression fx's.  Pt needing max to total assist for most mobility..  Pt currently limited functionally due to the problems listed. ( See problems list.)   Pt will benefit from PT to maximize function and safety in order to get ready for next venue listed below.     Follow Up Recommendations SNF;Supervision/Assistance - 24 hour    Equipment Recommendations  Other (comment)(TBA)    Recommendations for Other Services       Precautions / Restrictions Precautions Precautions: Fall      Mobility  Bed Mobility Overal bed mobility: Needs Assistance Bed Mobility: Rolling;Sidelying to Sit Rolling: Mod assist;+2 for physical assistance Sidelying to sit: Max assist;+2 for physical assistance     Sit to sidelying: Total assist;+2 for safety/equipment General bed mobility comments: cues for direction, moved legs into hooklying, significant truncal stability and hand over hand assist at R UE to help push.  Transfers Overall transfer level: Needs assistance   Transfers: Stand Pivot Transfers Sit to Stand: Max assist;Total assist Stand pivot transfers: Max assist;+2 physical assistance       General transfer comment: significant truncal support due to pain.  pt bear hugged therapist in attempt to assist and lessen the pain during the transfer  Ambulation/Gait                Stairs             Wheelchair Mobility    Modified Rankin (Stroke Patients Only)       Balance Overall balance assessment: Needs assistance Sitting-balance support: Feet supported;Bilateral upper extremity supported Sitting balance-Leahy Scale: Poor Sitting balance - Comments: leaning forward into therapist's body for support due to pain.   Standing balance support: No upper extremity supported;During functional activity Standing balance-Leahy Scale: Zero Standing balance comment: Requiring total A to maintain standing.                              Pertinent Vitals/Pain      Home Living Family/patient expects to be discharged to:: Assisted living                 Additional Comments: Per chart review and RN, Pt resides at [facility]    Prior Function                 Hand Dominance        Extremity/Trunk Assessment   Upper Extremity Assessment Upper Extremity Assessment: Generalized weakness    Lower Extremity Assessment Lower Extremity Assessment: Generalized weakness    Cervical / Trunk Assessment Cervical / Trunk Assessment: Other exceptions Cervical / Trunk Exceptions: Imaging of Lumbar region showing Multiple osteoporotic type fractures throughout the lower thoracic and lumbar region, age uncertain.  Communication   Communication: HOH  Cognition Arousal/Alertness: Awake/alert Behavior During Therapy: Anxious Overall Cognitive Status: History of cognitive impairments - at baseline  General Comments: Pt with baseline dementia. Oriented to self, place, and month. Required cues to oriented to reason for admission. When asked who she lives with at home pt stating "my parents."      General Comments General comments (skin integrity, edema, etc.): pt very anxious and with obvious pain during the mobility.    Exercises     Assessment/Plan    PT Assessment Patient needs continued PT services  PT  Problem List Decreased strength;Decreased activity tolerance;Decreased balance;Decreased mobility;Pain;Decreased cognition;Decreased knowledge of precautions       PT Treatment Interventions Gait training;Functional mobility training;DME instruction;Therapeutic activities;Balance training;Patient/family education    PT Goals (Current goals can be found in the Care Plan section)  Acute Rehab PT Goals Patient Stated Goal: Stop pain PT Goal Formulation: With patient Time For Goal Achievement: 08/12/18 Potential to Achieve Goals: Fair    Frequency Min 3X/week   Barriers to discharge        Co-evaluation               AM-PAC PT "6 Clicks" Daily Activity  Outcome Measure Difficulty turning over in bed (including adjusting bedclothes, sheets and blankets)?: Unable Difficulty moving from lying on back to sitting on the side of the bed? : Unable Difficulty sitting down on and standing up from a chair with arms (e.g., wheelchair, bedside commode, etc,.)?: Unable Help needed moving to and from a bed to chair (including a wheelchair)?: Total Help needed walking in hospital room?: Total Help needed climbing 3-5 steps with a railing? : Total 6 Click Score: 6    End of Session     Patient left: in chair;with call bell/phone within reach;Other (comment)(on the lift pad) Nurse Communication: Mobility status PT Visit Diagnosis: Other abnormalities of gait and mobility (R26.89);Difficulty in walking, not elsewhere classified (R26.2);Pain Pain - part of body: (back)    Time: [id number] PT Time Calculation (min) (ACUTE ONLY): 21 min   Charges:   PT Evaluation $PT Eval Moderate Complexity: 1 Mod          07/29/2018  Cushman Bing, PT Acute Rehabilitation Services [phone]  (pager) [phone]  (office)  Eliseo Gum Jersey Espinoza 07/29/2018, 2:56 PM

## 2018-07-29 NOTE — Clinical Social Work Note (Signed)
Clinical Social Work Assessment  Patient Details  Name: Rachel Rangel MRN: [id number] Date of Birth: 31-Oct-1933  Date of referral:  07/29/18               Reason for consult:  Facility Placement                Permission sought to share information with:  Facility Medical sales representative, Family Supports Permission granted to share information::  No  Name::     Buyer, retail::  [facility]  Relationship::  Daughter  Contact Information:  [phone]  Housing/Transportation Living arrangements for the past 2 months:  Assisted Living Facility Source of Information:  Adult Children, Facility Patient Interpreter Needed:  None Criminal Activity/Legal Involvement Pertinent to Current Situation/Hospitalization:  No - Comment as needed Significant Relationships:  Adult Children Lives with:  Facility Resident Do you feel safe going back to the place where you live?  Yes Need for family participation in patient care:  Yes (Comment)  Care giving concerns:  CSW received consult for possible SNF placement at time of discharge. CSW spoke with patient's daughter regarding PT recommendation of SNF placement at time of discharge. Patient's daughter reports that patient resides at [facility] but is physically declining. She reports understanding that SNF is recommended and is in agreement if the ALF also agrees. CSW to continue to follow and assist with discharge planning needs.   Social Worker assessment / plan:  CSW spoke with patient's daughter concerning possibility of rehab at [facility] before returning home.  Employment status:  Retired Database administrator PT Recommendations:  Skilled Nursing Facility Information / Referral to community resources:  Skilled Nursing Facility  Patient/Family's Response to care:  Patient's daughter recognizes need for rehab before returning home and is agreeable to a SNF in [place] if needed. Patient's daughter reported  preference for [facility] since patient has been there before. ALF to assess patient today regarding return. CSW explained insurance authorization process and will reach out to [facility] regarding bed availability. Patient's daughter is going out of state tomorrow and will need to handle patient's care transition by phone.   Patient/Family's Understanding of and Emotional Response to Diagnosis, Current Treatment, and Prognosis:  Patient/family is realistic regarding therapy needs and expressed being hopeful for return to ALF, though understands if SNF is needed first. Patient's daughter expressed understanding of CSW role and discharge process as well as medical condition. No questions/concerns about plan or treatment.    Emotional Assessment Appearance:  Appears stated age Attitude/Demeanor/Rapport:  Unable to Assess Affect (typically observed):  Unable to Assess Orientation:  Oriented to Self Alcohol / Substance use:  Not Applicable Psych involvement (Current and /or in the community):  No (Comment)  Discharge Needs  Concerns to be addressed:  Care Coordination Readmission within the last 30 days:  No Current discharge risk:  Dependent with Mobility Barriers to Discharge:  Continued Medical Work up   [employer], LCSW 07/29/2018, 3:35 PM

## 2018-07-29 NOTE — Progress Notes (Signed)
CSW spoke with [facility]. Vernona Rieger Scientist, water quality, [phone] cell) is coming to assess the patient today at 4pm to determine if she can return to ALF or go to SNF.   Osborne Casco Anvika Gashi LCSW [phone]

## 2018-07-29 NOTE — Progress Notes (Signed)
Initial Nutrition Assessment  DOCUMENTATION CODES:   Obesity unspecified  INTERVENTION:  -Ensure Enlive BID. Each supplement provides 350 kcal and 20 grams protein.  -MVI daily  -Downgrade diet to Dysphagia 3 (mechanical soft)   NUTRITION DIAGNOSIS:   Increased nutrient needs related to chronic illness(COPD) as evidenced by estimated needs.  GOAL:   Patient will meet greater than or equal to 90% of their needs   MONITOR:   PO intake, Supplement acceptance, Weight trends, Labs  REASON FOR ASSESSMENT:   Consult Assessment of nutrition requirement/status  ASSESSMENT:   84 year old female who presented to the ED on 10/29 after a mechanical fall at her AL facility. PMH significant for dementia, hypertension, type 2 diabetes mellitus, hyperlipidemia, COPD, and osteoporosis.  Visited pt in room. She is sitting in recliner. Pt with dementia and repeatedly yelling "help me" and "it hurts". No family present to provide reliable hx. Answers to most of the questions "I don't know". Says "I don't feel like eating, it hurts". Limited wt hx available for this pt. Per chart pt has lost 9% body wt since 08/2017.   Pt able to say that she prefers soft food because it is easier to eat. Would recommend downgrading to Dys 3 mechanic soft diet. When asked about Ensure she states she is not sure if she's had one but that she likes milkshakes.   Meds:colace, ss novolog Labs: CBGs125-280, sodium 134, GFR 32, A1C 6.8  NUTRITION - FOCUSED PHYSICAL EXAM:    Most Recent Value  Orbital Region  No depletion  Upper Arm Region  No depletion  Thoracic and Lumbar Region  No depletion  Buccal Region  No depletion  Temple Region  No depletion  Clavicle Bone Region  No depletion  Clavicle and Acromion Bone Region  No depletion  Scapular Bone Region  No depletion  Dorsal Hand  No depletion  Patellar Region  No depletion  Anterior Thigh Region  No depletion  Posterior Calf Region  No depletion   Edema (RD Assessment)  Mild  Hair  Reviewed  Eyes  Reviewed  Mouth  Reviewed  Skin  Reviewed  Nails  Reviewed       Diet Order:   Diet Order            Diet heart healthy/carb modified Room service appropriate? Yes; Fluid consistency: Thin  Diet effective now              EDUCATION NEEDS:   Not appropriate for education at this time  Skin:  Skin Assessment: Reviewed RN Assessment  Last BM:  N/A per chart  Height:   Ht Readings from Last 1 Encounters:  08/25/17 5\' 2"  (1.575 m)    Weight:   Wt Readings from Last 1 Encounters:  07/23/18 85.5 kg    Ideal Body Weight:  50 kg  BMI:  34.47  Estimated Nutritional Needs:   Kcal:  1700-1900  Protein:  85-95 grams  Fluid:  1.7-1.9 L/day    [facility], Dietetic Intern [phone]

## 2018-07-30 ENCOUNTER — Observation Stay (HOSPITAL_BASED_OUTPATIENT_CLINIC_OR_DEPARTMENT_OTHER): Payer: Medicare Other

## 2018-07-30 DIAGNOSIS — N189 Chronic kidney disease, unspecified: Secondary | ICD-10-CM | POA: Diagnosis not present

## 2018-07-30 DIAGNOSIS — R609 Edema, unspecified: Secondary | ICD-10-CM | POA: Diagnosis not present

## 2018-07-30 DIAGNOSIS — G308 Other Alzheimer's disease: Secondary | ICD-10-CM | POA: Diagnosis not present

## 2018-07-30 DIAGNOSIS — N179 Acute kidney failure, unspecified: Secondary | ICD-10-CM | POA: Diagnosis not present

## 2018-07-30 DIAGNOSIS — N390 Urinary tract infection, site not specified: Secondary | ICD-10-CM | POA: Diagnosis not present

## 2018-07-30 LAB — GLUCOSE, CAPILLARY
GLUCOSE-CAPILLARY: 164 mg/dL — AB (ref 70–99)
GLUCOSE-CAPILLARY: 152 mg/dL — AB (ref 70–99)
Glucose-Capillary: 153 mg/dL — ABNORMAL HIGH (ref 70–99)

## 2018-07-30 NOTE — Progress Notes (Signed)
VASCULAR LAB PRELIMINARY  PRELIMINARY  PRELIMINARY  PRELIMINARY  Bilateral lower extremity venous duplex completed.    Preliminary report:  There is no DVT or SVT noted in the bilateral lower extremities.   Greyden Besecker, RVT 07/30/2018, 3:04 PM

## 2018-07-30 NOTE — Progress Notes (Addendum)
[facility] recommending patient go to SNF. [facility] started authorization.  Pasrr: [id number]  Cristobal Goldmann LCSW [phone]

## 2018-07-30 NOTE — Progress Notes (Signed)
PROGRESS NOTE        PATIENT DETAILS Name: Rachel Rangel Age: 84 y.o. Sex: female Date of Birth: 1933-12-04 Admit Date: 07/28/2018 Admitting Physician Jonah Blue, MD ZOX:[id number], Onalee Hua, MD  Brief Narrative: Patient is a 84 y.o. female with prior history of dementia, dyslipidemia, DM-2, hypertension presenting to the hospital with a mechanical fall.  She was recently diagnosed with a UTI after another fall.  She was subsequently admitted to the hospitalist service for further evaluation and treatment.  Subjective: Complains of back pain.  But lying comfortably.  Assessment/Plan: Mechanical fall: History of recurrent falls-suspect this is secondary to gait disturbance in the setting of dementia.  Seen by rehab services, plans are for SNF on discharge.  Social worker following.    Acute hypoxic respiratory failure: Etiology uncertain-but appears very mild-Per H&P-patient has had intermittent issues with hypoxia in the past, lungs are completely clear on exam.  May need to be continued on O2 on discharge.  Follow-have ordered a lower extremity Doppler-but clinical features are not suggestive of VTE.    AKI on CKD stage III: AKI likely hemodynamically mediated, improving with supportive care.  Continue to avoid nephrotoxic agents.    UTI: Recent urine culture on 10/24 was positive for pansensitive E. coli, previously on IV Rocephin-has been transitioned to [place].  Stop date for antibiotics 11/1-suspect this is more cystitis and pyelonephritis.  Hypertension: BP controlled-continue Norvasc and Avapro.    DM-2: CBG stable with SSI, continue to hold metformin.   Dyslipidemia: Continue statin  Dementia: Pleasantly confused-think she is not that far from her usual baseline-continue Aricept and Celexa.  DVT Prophylaxis: Prophylactic Lovenox  Code Status: DNR  Family Communication: None at bedside  Disposition Plan: Remain inpatient- SNF on  discharge-most likely on 11/1  Antimicrobial agents: Anti-infectives (From admission, onward)   Start     Dose/Rate Route Frequency Ordered Stop   07/29/18 1300  cefdinir (OMNICEF) capsule 300 mg     300 mg Oral Every 12 hours 07/29/18 1257     07/28/18 1600  cefTRIAXone (ROCEPHIN) 1 g in sodium chloride 0.9 % 100 mL IVPB  Status:  Discontinued     1 g 200 mL/hr over 30 Minutes Intravenous Every 24 hours 07/28/18 1550 07/29/18 1257      Procedures: None  CONSULTS:  None  Time spent: 25- minutes-Greater than 50% of this time was spent in counseling, explanation of diagnosis, planning of further management, and coordination of care.  MEDICATIONS: Scheduled Meds: . amLODipine  10 mg Oral Daily  . cefdinir  300 mg Oral Q12H  . citalopram  10 mg Oral Daily  . docusate sodium  100 mg Oral BID  . donepezil  10 mg Oral QHS  . enoxaparin (LOVENOX) injection  40 mg Subcutaneous Q24H  . feeding supplement (ENSURE ENLIVE)  237 mL Oral BID BM  . insulin aspart  0-9 Units Subcutaneous TID WC  . irbesartan  300 mg Oral Daily  . lidocaine  1-3 patch Transdermal Q24H  . rosuvastatin  5 mg Oral QHS   Continuous Infusions: PRN Meds:.acetaminophen **OR** acetaminophen, ondansetron **OR** ondansetron (ZOFRAN) IV   PHYSICAL EXAM: Vital signs: Vitals:   07/29/18 0708 07/29/18 1545 07/29/18 2118 07/30/18 0429  BP: (!) 161/83 (!) 163/68 (!) 159/70 (!) 135/95  Pulse: (!) 55 68 74 64  Resp: 17 16 18  20  Temp: 98.2 F (36.8 C) 97.7 F (36.5 C) 99.3 F (37.4 C) 98.1 F (36.7 C)  TempSrc: Oral Oral Oral Oral  SpO2: 92% 91% (!) 81% 94%   There were no vitals filed for this visit. There is no height or weight on file to calculate BMI.   General appearance:Awake, pleasantly confused.  Not in any distress. Eyes:no scleral icterus. HEENT: Atraumatic and Normocephalic Neck: supple, no JVD. Resp:Good air entry bilaterally, no added sounds CVS: S1 S2 regular, no murmurs.  GI: Bowel sounds  present, Non tender and not distended with no gaurding, rigidity or rebound. Extremities: B/L Lower Ext shows trace edema, both legs are warm to touch Neurology:  Non focal Musculoskeletal:No digital cyanosis Skin:No Rash, warm and dry Wounds:N/A  I have personally reviewed following labs and imaging studies  LABORATORY DATA: CBC: Recent Labs  Lab 07/23/18 2150 07/28/18 0649 07/29/18 0411  WBC 15.4* 12.5* 11.9*  NEUTROABS 12.6* 10.1*  --   HGB 12.0 11.6* 10.4*  HCT 37.6 36.1 31.5*  MCV 94.0 91.6 89.2  PLT 270 222 193    Basic Metabolic Panel: Recent Labs  Lab 07/23/18 2150 07/28/18 0649 07/29/18 0411  NA 136 134* 134*  K 4.5 3.9 4.2  CL 99 104 104  CO2 23 16* 22  GLUCOSE 243* 213* 193*  BUN 28* 22 24*  CREATININE 1.85* 1.58* 1.47*  CALCIUM 10.4* 9.3 9.6    GFR: CrCl cannot be calculated (Unknown ideal weight.).  Liver Function Tests: Recent Labs  Lab 07/23/18 2150 07/28/18 0649  AST 17 23  ALT 15 21  ALKPHOS 70 64  BILITOT 0.9 1.8*  PROT 7.9 6.5  ALBUMIN 4.4 3.6   Recent Labs  Lab 07/23/18 2150 07/28/18 0649  LIPASE 43 34   No results for input(s): AMMONIA in the last 168 hours.  Coagulation Profile: No results for input(s): INR, PROTIME in the last 168 hours.  Cardiac Enzymes: No results for input(s): CKTOTAL, CKMB, CKMBINDEX, TROPONINI in the last 168 hours.  BNP (last 3 results) No results for input(s): PROBNP in the last 8760 hours.  HbA1C: Recent Labs    07/28/18 1823  HGBA1C 6.8*    CBG: Recent Labs  Lab 07/29/18 1349 07/29/18 1803 07/29/18 2155 07/30/18 0847 07/30/18 1303  GLUCAP 170* 296* 154* 164* 153*    Lipid Profile: No results for input(s): CHOL, HDL, LDLCALC, TRIG, CHOLHDL, LDLDIRECT in the last 72 hours.  Thyroid Function Tests: No results for input(s): TSH, T4TOTAL, FREET4, T3FREE, THYROIDAB in the last 72 hours.  Anemia Panel: No results for input(s): VITAMINB12, FOLATE, FERRITIN, TIBC, IRON, RETICCTPCT  in the last 72 hours.  Urine analysis:    Component Value Date/Time   COLORURINE YELLOW 07/23/2018 2150   APPEARANCEUR CLEAR 07/23/2018 2150   LABSPEC 1.013 07/23/2018 2150   PHURINE 5.0 07/23/2018 2150   GLUCOSEU 50 (A) 07/23/2018 2150   HGBUR NEGATIVE 07/23/2018 2150   BILIRUBINUR NEGATIVE 07/23/2018 2150   KETONESUR NEGATIVE 07/23/2018 2150   PROTEINUR NEGATIVE 07/23/2018 2150   NITRITE NEGATIVE 07/23/2018 2150   LEUKOCYTESUR SMALL (A) 07/23/2018 2150    Sepsis Labs: Lactic Acid, Venous    Component Value Date/Time   LATICACIDVEN 1.46 09/07/2015 1649    MICROBIOLOGY: Recent Results (from the past 240 hour(s))  Urine Culture     Status: Abnormal   Collection Time: 07/24/18 12:23 AM  Result Value Ref Range Status   Specimen Description   Final    URINE, CLEAN CATCH Performed at [facility]  [facility], [address], [place], [place] [address]    Special Requests   Final    NONE Performed at [facility], [address], [place], [place] [address]    Culture >=100,000 COLONIES/mL ESCHERICHIA COLI (A)  Final   Report Status 07/26/2018 FINAL  Final   Organism ID, Bacteria ESCHERICHIA COLI (A)  Final      Susceptibility   Escherichia coli - MIC*    AMPICILLIN <=2 SENSITIVE Sensitive     CEFAZOLIN <=4 SENSITIVE Sensitive     CEFTRIAXONE <=1 SENSITIVE Sensitive     CIPROFLOXACIN <=0.25 SENSITIVE Sensitive     GENTAMICIN <=1 SENSITIVE Sensitive     IMIPENEM <=0.25 SENSITIVE Sensitive     NITROFURANTOIN <=16 SENSITIVE Sensitive     TRIMETH/SULFA <=20 SENSITIVE Sensitive     AMPICILLIN/SULBACTAM <=2 SENSITIVE Sensitive     PIP/TAZO <=4 SENSITIVE Sensitive     Extended ESBL NEGATIVE Sensitive     * >=100,000 COLONIES/mL ESCHERICHIA COLI    RADIOLOGY STUDIES/RESULTS: Dg Chest 1 View  Result Date: 07/28/2018 CLINICAL DATA:  Pain following fall EXAM: CHEST  1 VIEW COMPARISON:  July 23, 2018 and September 07, 2015 FINDINGS: There is  atelectatic change in the left lower lobe. There is no appreciable edema or consolidation. Heart is upper normal in size with pulmonary vascularity normal. There is aortic atherosclerosis. No adenopathy. There is stable calcification in the right coracoclavicular joint. No pneumothorax. IMPRESSION: Atelectasis left base. Lungs elsewhere clear. Stable cardiac silhouette. There is aortic atherosclerosis. No pneumothorax. Aortic Atherosclerosis (ICD10-I70.0). Electronically Signed   By: Bretta Bang III M.D.   On: 07/28/2018 08:08   Dg Ribs Unilateral W/chest Right  Result Date: 07/23/2018 CLINICAL DATA:  Fall at nursing home. Right posterior lower rib pain. EXAM: RIGHT RIBS AND CHEST - 3+ VIEW COMPARISON:  Chest radiograph 08/24/2017 FINDINGS: Technically limited exam given patient's medical condition, unable to move position. Additionally soft tissue attenuation from habitus and bony under mineralization further limits assessment. Particularly, lower ribs are not well evaluated. No visualized rib fractures. No pulmonary complication such as pneumothorax or pleural effusion. No focal airspace disease. Cardiomegaly is grossly similar allowing for differences in technique. There is aortic atherosclerosis. Vascular congestion versus bronchovascular crowding. IMPRESSION: 1. No visualized rib fractures on limited exam. Patient's diminished mobility and soft tissue attenuation from habitus limits assessment, particularly of the lower ribs. 2. Low lung volumes with vascular congestion versus bronchovascular crowding. Cardiomegaly is similar to exam last year. Electronically Signed   By: Narda Rutherford M.D.   On: 07/23/2018 21:34   Dg Lumbar Spine Complete  Result Date: 07/28/2018 CLINICAL DATA:  Pain following fall EXAM: LUMBAR SPINE - COMPLETE 4+ VIEW COMPARISON:  Lumbar radiographs September 05, 2015 and lumbar CT September 07, 2015 FINDINGS: Frontal, lateral, spot lumbosacral lateral, and bilateral oblique  views were obtained. There are 5 non-rib-bearing lumbar type vertebral bodies. There is lumbar dextroscoliosis. Bones are diffusely osteoporotic. Since the 2016 studies, there has been progression of wedging of the T12 vertebral body. There is end plate concavity to varying degrees throughout the lumbar region, most notably at L4, consistent with diffuse osteoporotic type fractures. Similar changes are noted at T11. These fractures must be considered age uncertain. There is no spondylolisthesis. There is moderate disc space narrowing at L3-4 and L4-5. There is facet osteoarthritic change to varying degrees at all levels, most notably at L4-5 and L5-S1 bilaterally. There is aortic atherosclerosis. IMPRESSION: Diffuse osteoporosis. Multiple osteoporotic type fractures  throughout the lower thoracic and lumbar region, age uncertain. Extensive wedging of the T12 vertebral body has progressed since the 2016 study. Fractures must be considered age uncertain; an acute bony injury cannot be excluded. No spondylolisthesis. Osteoarthritic change noted at several levels. There is aortic atherosclerosis. Aortic Atherosclerosis (ICD10-I70.0). Electronically Signed   By: Bretta Bang III M.D.   On: 07/28/2018 08:11   Ct Head Wo Contrast  Result Date: 07/23/2018 CLINICAL DATA:  Fall. EXAM: CT HEAD WITHOUT CONTRAST CT CERVICAL SPINE WITHOUT CONTRAST TECHNIQUE: Multidetector CT imaging of the head and cervical spine was performed following the standard protocol without intravenous contrast. Multiplanar CT image reconstructions of the cervical spine were also generated. COMPARISON:  None. FINDINGS: CT HEAD FINDINGS Brain: There is severe atrophy and chronic small vessel disease changes. No acute intracranial abnormality. Specifically, no hemorrhage, hydrocephalus, mass lesion, acute infarction, or significant intracranial injury. Vascular: No hyperdense vessel or unexpected calcification. Skull: No acute calvarial  abnormality. Sinuses/Orbits: Visualized paranasal sinuses and mastoids clear. Orbital soft tissues unremarkable. Other: None CT CERVICAL SPINE FINDINGS Alignment: No subluxation Skull base and vertebrae: No fracture Soft tissues and spinal canal: Prevertebral soft tissues are normal. No epidural or paraspinal hematoma. Disc levels:  Diffuse degenerative disc and facet disease. Upper chest: No acute findings Other: Diffuse osteopenia. IMPRESSION: Severe chronic small vessel disease and cerebral atrophy. No acute intracranial abnormality. Osteopenia. Diffuse degenerative disc and facet disease. No acute bony abnormality in the cervical spine. Electronically Signed   By: Charlett Nose M.D.   On: 07/23/2018 22:19   Ct Cervical Spine Wo Contrast  Result Date: 07/23/2018 CLINICAL DATA:  Fall. EXAM: CT HEAD WITHOUT CONTRAST CT CERVICAL SPINE WITHOUT CONTRAST TECHNIQUE: Multidetector CT imaging of the head and cervical spine was performed following the standard protocol without intravenous contrast. Multiplanar CT image reconstructions of the cervical spine were also generated. COMPARISON:  None. FINDINGS: CT HEAD FINDINGS Brain: There is severe atrophy and chronic small vessel disease changes. No acute intracranial abnormality. Specifically, no hemorrhage, hydrocephalus, mass lesion, acute infarction, or significant intracranial injury. Vascular: No hyperdense vessel or unexpected calcification. Skull: No acute calvarial abnormality. Sinuses/Orbits: Visualized paranasal sinuses and mastoids clear. Orbital soft tissues unremarkable. Other: None CT CERVICAL SPINE FINDINGS Alignment: No subluxation Skull base and vertebrae: No fracture Soft tissues and spinal canal: Prevertebral soft tissues are normal. No epidural or paraspinal hematoma. Disc levels:  Diffuse degenerative disc and facet disease. Upper chest: No acute findings Other: Diffuse osteopenia. IMPRESSION: Severe chronic small vessel disease and cerebral atrophy.  No acute intracranial abnormality. Osteopenia. Diffuse degenerative disc and facet disease. No acute bony abnormality in the cervical spine. Electronically Signed   By: Charlett Nose M.D.   On: 07/23/2018 22:19     LOS: 0 days   Jeoffrey Massed, MD  [facility]  If 7PM-7AM, please contact night-coverage  Please page via [email or web address]-click on MD name and type text message  07/30/2018, 1:42 PM

## 2018-07-31 DIAGNOSIS — I10 Essential (primary) hypertension: Secondary | ICD-10-CM | POA: Diagnosis not present

## 2018-07-31 DIAGNOSIS — N189 Chronic kidney disease, unspecified: Secondary | ICD-10-CM | POA: Diagnosis not present

## 2018-07-31 DIAGNOSIS — N179 Acute kidney failure, unspecified: Secondary | ICD-10-CM | POA: Diagnosis not present

## 2018-07-31 DIAGNOSIS — S32000A Wedge compression fracture of unspecified lumbar vertebra, initial encounter for closed fracture: Secondary | ICD-10-CM | POA: Diagnosis not present

## 2018-07-31 LAB — GLUCOSE, CAPILLARY
GLUCOSE-CAPILLARY: 192 mg/dL — AB (ref 70–99)
GLUCOSE-CAPILLARY: 250 mg/dL — AB (ref 70–99)

## 2018-07-31 MED ORDER — FUROSEMIDE 20 MG PO TABS: 40.0000 mg | ORAL_TABLET | Freq: Every day | ORAL | 0 refills | Status: DC

## 2018-07-31 MED ORDER — HYDRALAZINE HCL 20 MG/ML IJ SOLN
10.0000 mg | Freq: Once | INTRAMUSCULAR | Status: AC
  Administered 2018-07-31: 10 mg via INTRAVENOUS
  Filled 2018-07-31: qty 1

## 2018-07-31 MED ORDER — FUROSEMIDE 40 MG PO TABS
40.0000 mg | ORAL_TABLET | Freq: Every day | ORAL | Status: DC
  Administered 2018-07-31: 40 mg via ORAL
  Filled 2018-07-31: qty 1

## 2018-07-31 NOTE — Discharge Summary (Signed)
PATIENT DETAILS Name: Rachel Rangel Age: 84 y.o. Sex: female Date of Birth: 04-24-1934 MRN: [id number]. Admitting Physician: Jonah Blue, MD WUJ:[id number], Onalee Hua, MD  Admit Date: 07/28/2018 Discharge date: 07/31/2018  Recommendations for Outpatient Follow-up:  1. Follow up with PCP in 1-2 weeks 2. Please obtain BMP/CBC in one week   Admitted From:  ALF   Disposition: SNF   Home Health: No  Equipment/Devices: None  Discharge Condition: Stable  CODE STATUS:  DNR  Diet recommendation:  Heart Healthy / Carb Modified  Brief Summary: See H&P, Labs, Consult and Test reports for all details in brief, Patient is a 84 y.o. female with prior history of dementia, dyslipidemia, DM-2, hypertension presenting to the hospital with a mechanical fall.  She was recently diagnosed with a UTI after another fall.  She was subsequently admitted to the hospitalist service for further evaluation and treatment.  Brief Hospital Course: Mechanical fall: Has recurrent falls-I suspect this is secondary to gait disturbance in the setting of dementia.    Evaluated by PT services-recommendations of SNF on discharge.  Stable to be discharged to SNF  Acute hypoxic respiratory failure: Etiology uncertain-but appears very mild-Per H&P-patient has had intermittent issues with hypoxia in the past, lungs are completely clear on exam.    Lower extremity Dopplers are negative for VTE-since low suspicion-further work-up not pursued.  In any event patient is very elderly frail-has had frequent falls-and suspect she would not be an ideal long-term candidate.  She does not have evidence of CHF on exam.  Spoke with the daughter-best probably to discontinue patient on O2 intermittently-given her advanced age/frailty-probably best to not pursue further work-up as it will not change management.  Patient's daughter agrees.   AKI on CKD stage III: AKI likely hemodynamically mediated-improving with supportive care-and  back to usual baseline.  Avoid nephrotoxic agents.  UTI: Recent urine culture on 10/24 was positive for pansensitive E. coli-apparently she was on Macrobid-do not think she requires IV Rocephin-we will transition to [facility]  Suspect this is more of cystitis rather than pyelonephritis-hence we will plan on 3 days of antimicrobial treatment-does not require any further antimicrobial therapy on discharge.  Hypertension: Activating-have restarted Lasix, continue amlodipine and Avapro.  DM-2: CBG stable with SSI.  Do metformin on discharge.  Dyslipidemia: Continue statin  Dementia: Pleasantly confused-think she is not that far from her usual baseline-continue Aricept and Celexa.  Procedures/Studies: None  Discharge Diagnoses:  Principal Problem:   Fall Active Problems:   Diabetes mellitus type 2, controlled, without complications (HCC)   Dementia without behavioral disturbance (HCC)   Essential hypertension   Acute kidney injury superimposed on CKD (HCC)   Acute lower UTI   Hypoxia   Discharge Instructions:  Activity:  As tolerated with Full fall precautions use walker/cane & assistance as needed   Discharge Instructions    Call MD for:  persistant nausea and vomiting   Complete by:  As directed    Diet - low sodium heart healthy   Complete by:  As directed    Diet Carb Modified   Complete by:  As directed    Discharge instructions   Complete by:  As directed    Follow with Primary MD  Tally Joe, MD  and other consultant's as instructed your Hospitalist MD  Please get a complete blood count and chemistry panel checked by your Primary MD at your next visit, and again as instructed by your Primary MD.  Get Medicines reviewed and adjusted: Please take  all your medications with you for your next visit with your Primary MD  Laboratory/radiological data: Please request your Primary MD to go over all hospital tests and procedure/radiological results at the follow up,  please ask your Primary MD to get all Hospital records sent to his/her office.  In some cases, they will be blood work, cultures and biopsy results pending at the time of your discharge. Please request that your primary care M.D. follows up on these results.  Also Note the following: If you experience worsening of your admission symptoms, develop shortness of breath, life threatening emergency, suicidal or homicidal thoughts you must seek medical attention immediately by calling 911 or calling your MD immediately  if symptoms less severe.  You must read complete instructions/literature along with all the possible adverse reactions/side effects for all the Medicines you take and that have been prescribed to you. Take any new Medicines after you have completely understood and accpet all the possible adverse reactions/side effects.   Do not drive when taking Pain medications or sleeping medications (Benzodaizepines)  Do not take more than prescribed Pain, Sleep and Anxiety Medications. It is not advisable to combine anxiety,sleep and pain medications without talking with your primary care practitioner  Special Instructions: If you have smoked or chewed Tobacco  in the last 2 yrs please stop smoking, stop any regular Alcohol  and or any Recreational drug use.  Wear Seat belts while driving.  Please note: You were cared for by a hospitalist during your hospital stay. Once you are discharged, your primary care physician will handle any further medical issues. Please note that NO REFILLS for any discharge medications will be authorized once you are discharged, as it is imperative that you return to your primary care physician (or establish a relationship with a primary care physician if you do not have one) for your post hospital discharge needs so that they can reassess your need for medications and monitor your lab values.   Increase activity slowly   Complete by:  As directed      Allergies as of  07/31/2018      Reactions   Lipitor [atorvastatin] Other (See Comments)   Intolerant to 40 MG due to myalgias, however tolerates 20 MG dose   Penicillins    Per [facility] Has patient had a PCN reaction causing immediate rash, facial/tongue/throat swelling, SOB or lightheadedness with hypotension: Unk Has patient had a PCN reaction causing severe rash involving mucus membranes or skin necrosis: Unk Has patient had a PCN reaction that required hospitalization: Unk Has patient had a PCN reaction occurring within the last 10 years: Unk      Medication List    STOP taking these medications   ibuprofen 200 MG tablet Commonly known as:  ADVIL,MOTRIN     TAKE these medications   amLODipine 10 MG tablet Commonly known as:  NORVASC Take 10 mg by mouth daily.   APAP 500 PO Take 500 mg by mouth every 6 (six) hours as needed (pain).   citalopram 10 MG tablet Commonly known as:  CELEXA Take 1 tablet (10 mg total) by mouth daily.   docusate sodium 100 MG capsule Commonly known as:  COLACE Take 1 capsule (100 mg total) by mouth 2 (two) times daily. What changed:    when to take this  reasons to take this   donepezil 10 MG tablet Commonly known as:  ARICEPT Take 1 tablet (10 mg total) by mouth at bedtime. Start with 1/2  tablet for 2 weeks then increase to a whole tablet What changed:  additional instructions   furosemide 20 MG tablet Commonly known as:  LASIX Take 2 tablets (40 mg total) by mouth daily. What changed:    how much to take  when to take this   irbesartan 300 MG tablet Commonly known as:  AVAPRO Take 300 mg by mouth daily.   loratadine 10 MG tablet Commonly known as:  CLARITIN Take 10 mg by mouth daily as needed for allergies.   metFORMIN 1000 MG tablet Commonly known as:  GLUCOPHAGE Take 1 tablet (1,000 mg total) by mouth 2 (two) times daily with a meal.   Mineral Oil Oil Place 2 drops into both ears every Wednesday.   nitrofurantoin  (macrocrystal-monohydrate) 100 MG capsule Commonly known as:  MACROBID Take 100 mg by mouth 2 (two) times daily. FOR 10 DAYS   nystatin cream Commonly known as:  MYCOSTATIN Apply 1 application topically See admin instructions. Apply to groin and buttocks 2 times a day   ondansetron 4 MG tablet Commonly known as:  ZOFRAN Take 4 mg by mouth every 8 (eight) hours as needed for nausea or vomiting.   potassium chloride 10 MEQ tablet Commonly known as:  K-DUR,KLOR-CON Take 10 mEq by mouth daily.   rosuvastatin 5 MG tablet Commonly known as:  CRESTOR Take 5 mg by mouth at bedtime.   Vitamin D3 2000 units capsule Take 2,000 Units by mouth daily.       Contact information for follow-up providers    Tally Joe, MD. Schedule an appointment as soon as possible for a visit in 1 week(s).   Specialty:  Family Medicine Contact information: [phone] [address] [place] [address] [phone]            Contact information for after-discharge care    Destination    [facility] .   Service:  Skilled Nursing Contact information: [address] [place] [address] [phone]                 Allergies  Allergen Reactions  . Lipitor [Atorvastatin] Other (See Comments)    Intolerant to 40 MG due to myalgias, however tolerates 20 MG dose  . Penicillins     Per [facility] Has patient had a PCN reaction causing immediate rash, facial/tongue/throat swelling, SOB or lightheadedness with hypotension: Unk Has patient had a PCN reaction causing severe rash involving mucus membranes or skin necrosis: Unk Has patient had a PCN reaction that required hospitalization: Unk Has patient had a PCN reaction occurring within the last 10 years: Unk      Consultations:   None  Other Procedures/Studies: Dg Chest 1 View  Result Date: 07/28/2018 CLINICAL DATA:  Pain following fall EXAM: CHEST  1 VIEW COMPARISON:  July 23, 2018 and September 07, 2015 FINDINGS: There is atelectatic change in the left lower lobe. There is no appreciable edema or consolidation. Heart is upper normal in size with pulmonary vascularity normal. There is aortic atherosclerosis. No adenopathy. There is stable calcification in the right coracoclavicular joint. No pneumothorax. IMPRESSION: Atelectasis left base. Lungs elsewhere clear. Stable cardiac silhouette. There is aortic atherosclerosis. No pneumothorax. Aortic Atherosclerosis (ICD10-I70.0). Electronically Signed   By: Bretta Bang III M.D.   On: 07/28/2018 08:08   Dg Ribs Unilateral W/chest Right  Result Date: 07/23/2018 CLINICAL DATA:  Fall at nursing home. Right posterior lower rib pain. EXAM: RIGHT RIBS AND CHEST -  3+ VIEW COMPARISON:  Chest radiograph 08/24/2017 FINDINGS: Technically limited exam given patient's medical condition, unable to move position. Additionally soft tissue attenuation from habitus and bony under mineralization further limits assessment. Particularly, lower ribs are not well evaluated. No visualized rib fractures. No pulmonary complication such as pneumothorax or pleural effusion. No focal airspace disease. Cardiomegaly is grossly similar allowing for differences in technique. There is aortic atherosclerosis. Vascular congestion versus bronchovascular crowding. IMPRESSION: 1. No visualized rib fractures on limited exam. Patient's diminished mobility and soft tissue attenuation from habitus limits assessment, particularly of the lower ribs. 2. Low lung volumes with vascular congestion versus bronchovascular crowding. Cardiomegaly is similar to exam last year. Electronically Signed   By: Narda Rutherford M.D.   On: 07/23/2018 21:34   Dg Lumbar Spine Complete  Result Date: 07/28/2018 CLINICAL DATA:  Pain following fall EXAM: LUMBAR SPINE - COMPLETE 4+ VIEW COMPARISON:  Lumbar radiographs September 05, 2015 and lumbar CT September 07, 2015 FINDINGS: Frontal, lateral, spot  lumbosacral lateral, and bilateral oblique views were obtained. There are 5 non-rib-bearing lumbar type vertebral bodies. There is lumbar dextroscoliosis. Bones are diffusely osteoporotic. Since the 2016 studies, there has been progression of wedging of the T12 vertebral body. There is end plate concavity to varying degrees throughout the lumbar region, most notably at L4, consistent with diffuse osteoporotic type fractures. Similar changes are noted at T11. These fractures must be considered age uncertain. There is no spondylolisthesis. There is moderate disc space narrowing at L3-4 and L4-5. There is facet osteoarthritic change to varying degrees at all levels, most notably at L4-5 and L5-S1 bilaterally. There is aortic atherosclerosis. IMPRESSION: Diffuse osteoporosis. Multiple osteoporotic type fractures throughout the lower thoracic and lumbar region, age uncertain. Extensive wedging of the T12 vertebral body has progressed since the 2016 study. Fractures must be considered age uncertain; an acute bony injury cannot be excluded. No spondylolisthesis. Osteoarthritic change noted at several levels. There is aortic atherosclerosis. Aortic Atherosclerosis (ICD10-I70.0). Electronically Signed   By: Bretta Bang III M.D.   On: 07/28/2018 08:11   Ct Head Wo Contrast  Result Date: 07/23/2018 CLINICAL DATA:  Fall. EXAM: CT HEAD WITHOUT CONTRAST CT CERVICAL SPINE WITHOUT CONTRAST TECHNIQUE: Multidetector CT imaging of the head and cervical spine was performed following the standard protocol without intravenous contrast. Multiplanar CT image reconstructions of the cervical spine were also generated. COMPARISON:  None. FINDINGS: CT HEAD FINDINGS Brain: There is severe atrophy and chronic small vessel disease changes. No acute intracranial abnormality. Specifically, no hemorrhage, hydrocephalus, mass lesion, acute infarction, or significant intracranial injury. Vascular: No hyperdense vessel or unexpected  calcification. Skull: No acute calvarial abnormality. Sinuses/Orbits: Visualized paranasal sinuses and mastoids clear. Orbital soft tissues unremarkable. Other: None CT CERVICAL SPINE FINDINGS Alignment: No subluxation Skull base and vertebrae: No fracture Soft tissues and spinal canal: Prevertebral soft tissues are normal. No epidural or paraspinal hematoma. Disc levels:  Diffuse degenerative disc and facet disease. Upper chest: No acute findings Other: Diffuse osteopenia. IMPRESSION: Severe chronic small vessel disease and cerebral atrophy. No acute intracranial abnormality. Osteopenia. Diffuse degenerative disc and facet disease. No acute bony abnormality in the cervical spine. Electronically Signed   By: Charlett Nose M.D.   On: 07/23/2018 22:19   Ct Cervical Spine Wo Contrast  Result Date: 07/23/2018 CLINICAL DATA:  Fall. EXAM: CT HEAD WITHOUT CONTRAST CT CERVICAL SPINE WITHOUT CONTRAST TECHNIQUE: Multidetector CT imaging of the head and cervical spine was performed following the standard protocol without intravenous contrast. Multiplanar CT  image reconstructions of the cervical spine were also generated. COMPARISON:  None. FINDINGS: CT HEAD FINDINGS Brain: There is severe atrophy and chronic small vessel disease changes. No acute intracranial abnormality. Specifically, no hemorrhage, hydrocephalus, mass lesion, acute infarction, or significant intracranial injury. Vascular: No hyperdense vessel or unexpected calcification. Skull: No acute calvarial abnormality. Sinuses/Orbits: Visualized paranasal sinuses and mastoids clear. Orbital soft tissues unremarkable. Other: None CT CERVICAL SPINE FINDINGS Alignment: No subluxation Skull base and vertebrae: No fracture Soft tissues and spinal canal: Prevertebral soft tissues are normal. No epidural or paraspinal hematoma. Disc levels:  Diffuse degenerative disc and facet disease. Upper chest: No acute findings Other: Diffuse osteopenia. IMPRESSION: Severe chronic  small vessel disease and cerebral atrophy. No acute intracranial abnormality. Osteopenia. Diffuse degenerative disc and facet disease. No acute bony abnormality in the cervical spine. Electronically Signed   By: Charlett Nose M.D.   On: 07/23/2018 22:19   Vas [place] Lower Extremity Venous (dvt)  Result Date: 07/30/2018  Lower Venous Study Indications: Edema.  Limitations: Patient's inability to move secondary to pain. Performing Technologist: Sherren Kerns RVS  Examination Guidelines: A complete evaluation includes B-mode imaging, spectral Doppler, color Doppler, and power Doppler as needed of all accessible portions of each vessel. Bilateral testing is considered an integral part of a complete examination. Limited examinations for reoccurring indications may be performed as noted.  Right Venous Findings: +---------+---------------+---------+-----------+----------+-------+          CompressibilityPhasicitySpontaneityPropertiesSummary +---------+---------------+---------+-----------+----------+-------+ CFV      Full           Yes      Yes                          +---------+---------------+---------+-----------+----------+-------+ SFJ      Full                                                 +---------+---------------+---------+-----------+----------+-------+ FV Prox  Full                                                 +---------+---------------+---------+-----------+----------+-------+ FV Mid   Full                                                 +---------+---------------+---------+-----------+----------+-------+ FV DistalFull                                                 +---------+---------------+---------+-----------+----------+-------+ POP                     Yes      Yes                          +---------+---------------+---------+-----------+----------+-------+ PTV      Full                                                  +---------+---------------+---------+-----------+----------+-------+  Right Technical Findings: Not visualized segments include not all segments of the profunda, popliteal, and peroneal veins.  Left Venous Findings: +---------+---------------+---------+-----------+----------+-------+          CompressibilityPhasicitySpontaneityPropertiesSummary +---------+---------------+---------+-----------+----------+-------+ CFV      Full                                                 +---------+---------------+---------+-----------+----------+-------+ SFJ      Full                                                 +---------+---------------+---------+-----------+----------+-------+ FV Prox  Full                                                 +---------+---------------+---------+-----------+----------+-------+ FV Mid   Full                                                 +---------+---------------+---------+-----------+----------+-------+ FV DistalFull                                                 +---------+---------------+---------+-----------+----------+-------+ POP                     Yes      Yes                          +---------+---------------+---------+-----------+----------+-------+ PTV      Full                                                 +---------+---------------+---------+-----------+----------+-------+ GSV      Full                                                 +---------+---------------+---------+-----------+----------+-------+  Left Technical Findings: Not visualized segments include not all segments of the profunda, popliteal, and peroneal veins.   Summary: Right: There is no evidence of deep vein thrombosis in the lower extremity. However, portions of this examination were limited- see technologist comments above. Left: There is no evidence of deep vein thrombosis in the lower extremity. However, portions of this examination were limited-  see technologist comments above.  *See table(s) above for measurements and observations. Electronically signed by Sherald Hess MD on 07/30/2018 at 6:17:02 PM.    Final       TODAY-DAY OF DISCHARGE:  Subjective:   Rachel Rangel today remains pleasantly confused.  Objective:   Blood pressure (!) 186/74, pulse 73, temperature 98.5 F (36.9 C), temperature source Oral, resp.  rate 17, SpO2 96 %.  Intake/Output Summary (Last 24 hours) at 07/31/2018 0904 Last data filed at 07/30/2018 2107 Gross per 24 hour  Intake 360 ml  Output 800 ml  Net -440 ml   There were no vitals filed for this visit.  Exam: Awake Alert, pleasantly confused, No new F.N deficits, Normal affect [place].AT,PERRAL Supple Neck,No JVD, No cervical lymphadenopathy appriciated.  Symmetrical Chest wall movement, Good air movement bilaterally, CTAB RRR,No Gallops,Rubs or new Murmurs, No Parasternal Heave +ve B.Sounds, Abd Soft, Non tender, No organomegaly appriciated, No rebound -guarding or rigidity. No Cyanosis, Clubbing or edema, No new Rash or bruise   PERTINENT RADIOLOGIC STUDIES: Dg Chest 1 View  Result Date: 07/28/2018 CLINICAL DATA:  Pain following fall EXAM: CHEST  1 VIEW COMPARISON:  July 23, 2018 and September 07, 2015 FINDINGS: There is atelectatic change in the left lower lobe. There is no appreciable edema or consolidation. Heart is upper normal in size with pulmonary vascularity normal. There is aortic atherosclerosis. No adenopathy. There is stable calcification in the right coracoclavicular joint. No pneumothorax. IMPRESSION: Atelectasis left base. Lungs elsewhere clear. Stable cardiac silhouette. There is aortic atherosclerosis. No pneumothorax. Aortic Atherosclerosis (ICD10-I70.0). Electronically Signed   By: Bretta Bang III M.D.   On: 07/28/2018 08:08   Dg Ribs Unilateral W/chest Right  Result Date: 07/23/2018 CLINICAL DATA:  Fall at nursing home. Right posterior lower rib pain. EXAM:  RIGHT RIBS AND CHEST - 3+ VIEW COMPARISON:  Chest radiograph 08/24/2017 FINDINGS: Technically limited exam given patient's medical condition, unable to move position. Additionally soft tissue attenuation from habitus and bony under mineralization further limits assessment. Particularly, lower ribs are not well evaluated. No visualized rib fractures. No pulmonary complication such as pneumothorax or pleural effusion. No focal airspace disease. Cardiomegaly is grossly similar allowing for differences in technique. There is aortic atherosclerosis. Vascular congestion versus bronchovascular crowding. IMPRESSION: 1. No visualized rib fractures on limited exam. Patient's diminished mobility and soft tissue attenuation from habitus limits assessment, particularly of the lower ribs. 2. Low lung volumes with vascular congestion versus bronchovascular crowding. Cardiomegaly is similar to exam last year. Electronically Signed   By: Narda Rutherford M.D.   On: 07/23/2018 21:34   Dg Lumbar Spine Complete  Result Date: 07/28/2018 CLINICAL DATA:  Pain following fall EXAM: LUMBAR SPINE - COMPLETE 4+ VIEW COMPARISON:  Lumbar radiographs September 05, 2015 and lumbar CT September 07, 2015 FINDINGS: Frontal, lateral, spot lumbosacral lateral, and bilateral oblique views were obtained. There are 5 non-rib-bearing lumbar type vertebral bodies. There is lumbar dextroscoliosis. Bones are diffusely osteoporotic. Since the 2016 studies, there has been progression of wedging of the T12 vertebral body. There is end plate concavity to varying degrees throughout the lumbar region, most notably at L4, consistent with diffuse osteoporotic type fractures. Similar changes are noted at T11. These fractures must be considered age uncertain. There is no spondylolisthesis. There is moderate disc space narrowing at L3-4 and L4-5. There is facet osteoarthritic change to varying degrees at all levels, most notably at L4-5 and L5-S1 bilaterally. There is  aortic atherosclerosis. IMPRESSION: Diffuse osteoporosis. Multiple osteoporotic type fractures throughout the lower thoracic and lumbar region, age uncertain. Extensive wedging of the T12 vertebral body has progressed since the 2016 study. Fractures must be considered age uncertain; an acute bony injury cannot be excluded. No spondylolisthesis. Osteoarthritic change noted at several levels. There is aortic atherosclerosis. Aortic Atherosclerosis (ICD10-I70.0). Electronically Signed   By: Bretta Bang III M.D.   On:  07/28/2018 08:11   Ct Head Wo Contrast  Result Date: 07/23/2018 CLINICAL DATA:  Fall. EXAM: CT HEAD WITHOUT CONTRAST CT CERVICAL SPINE WITHOUT CONTRAST TECHNIQUE: Multidetector CT imaging of the head and cervical spine was performed following the standard protocol without intravenous contrast. Multiplanar CT image reconstructions of the cervical spine were also generated. COMPARISON:  None. FINDINGS: CT HEAD FINDINGS Brain: There is severe atrophy and chronic small vessel disease changes. No acute intracranial abnormality. Specifically, no hemorrhage, hydrocephalus, mass lesion, acute infarction, or significant intracranial injury. Vascular: No hyperdense vessel or unexpected calcification. Skull: No acute calvarial abnormality. Sinuses/Orbits: Visualized paranasal sinuses and mastoids clear. Orbital soft tissues unremarkable. Other: None CT CERVICAL SPINE FINDINGS Alignment: No subluxation Skull base and vertebrae: No fracture Soft tissues and spinal canal: Prevertebral soft tissues are normal. No epidural or paraspinal hematoma. Disc levels:  Diffuse degenerative disc and facet disease. Upper chest: No acute findings Other: Diffuse osteopenia. IMPRESSION: Severe chronic small vessel disease and cerebral atrophy. No acute intracranial abnormality. Osteopenia. Diffuse degenerative disc and facet disease. No acute bony abnormality in the cervical spine. Electronically Signed   By: Charlett Nose  M.D.   On: 07/23/2018 22:19   Ct Cervical Spine Wo Contrast  Result Date: 07/23/2018 CLINICAL DATA:  Fall. EXAM: CT HEAD WITHOUT CONTRAST CT CERVICAL SPINE WITHOUT CONTRAST TECHNIQUE: Multidetector CT imaging of the head and cervical spine was performed following the standard protocol without intravenous contrast. Multiplanar CT image reconstructions of the cervical spine were also generated. COMPARISON:  None. FINDINGS: CT HEAD FINDINGS Brain: There is severe atrophy and chronic small vessel disease changes. No acute intracranial abnormality. Specifically, no hemorrhage, hydrocephalus, mass lesion, acute infarction, or significant intracranial injury. Vascular: No hyperdense vessel or unexpected calcification. Skull: No acute calvarial abnormality. Sinuses/Orbits: Visualized paranasal sinuses and mastoids clear. Orbital soft tissues unremarkable. Other: None CT CERVICAL SPINE FINDINGS Alignment: No subluxation Skull base and vertebrae: No fracture Soft tissues and spinal canal: Prevertebral soft tissues are normal. No epidural or paraspinal hematoma. Disc levels:  Diffuse degenerative disc and facet disease. Upper chest: No acute findings Other: Diffuse osteopenia. IMPRESSION: Severe chronic small vessel disease and cerebral atrophy. No acute intracranial abnormality. Osteopenia. Diffuse degenerative disc and facet disease. No acute bony abnormality in the cervical spine. Electronically Signed   By: Charlett Nose M.D.   On: 07/23/2018 22:19   Vas [place] Lower Extremity Venous (dvt)  Result Date: 07/30/2018  Lower Venous Study Indications: Edema.  Limitations: Patient's inability to move secondary to pain. Performing Technologist: Sherren Kerns RVS  Examination Guidelines: A complete evaluation includes B-mode imaging, spectral Doppler, color Doppler, and power Doppler as needed of all accessible portions of each vessel. Bilateral testing is considered an integral part of a complete examination. Limited  examinations for reoccurring indications may be performed as noted.  Right Venous Findings: +---------+---------------+---------+-----------+----------+-------+          CompressibilityPhasicitySpontaneityPropertiesSummary +---------+---------------+---------+-----------+----------+-------+ CFV      Full           Yes      Yes                          +---------+---------------+---------+-----------+----------+-------+ SFJ      Full                                                 +---------+---------------+---------+-----------+----------+-------+  FV Prox  Full                                                 +---------+---------------+---------+-----------+----------+-------+ FV Mid   Full                                                 +---------+---------------+---------+-----------+----------+-------+ FV DistalFull                                                 +---------+---------------+---------+-----------+----------+-------+ POP                     Yes      Yes                          +---------+---------------+---------+-----------+----------+-------+ PTV      Full                                                 +---------+---------------+---------+-----------+----------+-------+  Right Technical Findings: Not visualized segments include not all segments of the profunda, popliteal, and peroneal veins.  Left Venous Findings: +---------+---------------+---------+-----------+----------+-------+          CompressibilityPhasicitySpontaneityPropertiesSummary +---------+---------------+---------+-----------+----------+-------+ CFV      Full                                                 +---------+---------------+---------+-----------+----------+-------+ SFJ      Full                                                 +---------+---------------+---------+-----------+----------+-------+ FV Prox  Full                                                  +---------+---------------+---------+-----------+----------+-------+ FV Mid   Full                                                 +---------+---------------+---------+-----------+----------+-------+ FV DistalFull                                                 +---------+---------------+---------+-----------+----------+-------+ POP                     Yes  Yes                          +---------+---------------+---------+-----------+----------+-------+ PTV      Full                                                 +---------+---------------+---------+-----------+----------+-------+ GSV      Full                                                 +---------+---------------+---------+-----------+----------+-------+  Left Technical Findings: Not visualized segments include not all segments of the profunda, popliteal, and peroneal veins.   Summary: Right: There is no evidence of deep vein thrombosis in the lower extremity. However, portions of this examination were limited- see technologist comments above. Left: There is no evidence of deep vein thrombosis in the lower extremity. However, portions of this examination were limited- see technologist comments above.  *See table(s) above for measurements and observations. Electronically signed by Sherald Hess MD on 07/30/2018 at 6:17:02 PM.    Final      PERTINENT LAB RESULTS: CBC: Recent Labs    07/29/18 0411  WBC 11.9*  HGB 10.4*  HCT 31.5*  PLT 193   CMET CMP     Component Value Date/Time   NA 134 (L) 07/29/2018 0411   NA 141 09/12/2015   K 4.2 07/29/2018 0411   CL 104 07/29/2018 0411   CO2 22 07/29/2018 0411   GLUCOSE 193 (H) 07/29/2018 0411   BUN 24 (H) 07/29/2018 0411   BUN 17 09/12/2015   CREATININE 1.47 (H) 07/29/2018 0411   CALCIUM 9.6 07/29/2018 0411   PROT 6.5 07/28/2018 0649   ALBUMIN 3.6 07/28/2018 0649   AST 23 07/28/2018 0649   ALT 21 07/28/2018 0649   ALKPHOS 64 07/28/2018  0649   BILITOT 1.8 (H) 07/28/2018 0649   GFRNONAA 32 (L) 07/29/2018 0411   GFRAA 37 (L) 07/29/2018 0411    GFR CrCl cannot be calculated (Unknown ideal weight.). No results for input(s): LIPASE, AMYLASE in the last 72 hours. No results for input(s): CKTOTAL, CKMB, CKMBINDEX, TROPONINI in the last 72 hours. Invalid input(s): POCBNP No results for input(s): DDIMER in the last 72 hours. Recent Labs    07/28/18 1823  HGBA1C 6.8*   No results for input(s): CHOL, HDL, LDLCALC, TRIG, CHOLHDL, LDLDIRECT in the last 72 hours. No results for input(s): TSH, T4TOTAL, T3FREE, THYROIDAB in the last 72 hours.  Invalid input(s): FREET3 No results for input(s): VITAMINB12, FOLATE, FERRITIN, TIBC, IRON, RETICCTPCT in the last 72 hours. Coags: No results for input(s): INR in the last 72 hours.  Invalid input(s): PT Microbiology: Recent Results (from the past 240 hour(s))  Urine Culture     Status: Abnormal   Collection Time: 07/24/18 12:23 AM  Result Value Ref Range Status   Specimen Description   Final    URINE, CLEAN CATCH Performed at [facility], [address], [place], [place] [address]    Special Requests   Final    NONE Performed at [facility], [address] [place], [place] [address]    Culture >=100,000 COLONIES/mL ESCHERICHIA COLI (A)  Final  Report Status 07/26/2018 FINAL  Final   Organism ID, Bacteria ESCHERICHIA COLI (A)  Final      Susceptibility   Escherichia coli - MIC*    AMPICILLIN <=2 SENSITIVE Sensitive     CEFAZOLIN <=4 SENSITIVE Sensitive     CEFTRIAXONE <=1 SENSITIVE Sensitive     CIPROFLOXACIN <=0.25 SENSITIVE Sensitive     GENTAMICIN <=1 SENSITIVE Sensitive     IMIPENEM <=0.25 SENSITIVE Sensitive     NITROFURANTOIN <=16 SENSITIVE Sensitive     TRIMETH/SULFA <=20 SENSITIVE Sensitive     AMPICILLIN/SULBACTAM <=2 SENSITIVE Sensitive     PIP/TAZO <=4 SENSITIVE Sensitive     Extended ESBL NEGATIVE Sensitive     *  >=100,000 COLONIES/mL ESCHERICHIA COLI    FURTHER DISCHARGE INSTRUCTIONS:  Get Medicines reviewed and adjusted: Please take all your medications with you for your next visit with your Primary MD  Laboratory/radiological data: Please request your Primary MD to go over all hospital tests and procedure/radiological results at the follow up, please ask your Primary MD to get all Hospital records sent to his/her office.  In some cases, they will be blood work, cultures and biopsy results pending at the time of your discharge. Please request that your primary care M.D. goes through all the records of your hospital data and follows up on these results.  Also Note the following: If you experience worsening of your admission symptoms, develop shortness of breath, life threatening emergency, suicidal or homicidal thoughts you must seek medical attention immediately by calling 911 or calling your MD immediately  if symptoms less severe.  You must read complete instructions/literature along with all the possible adverse reactions/side effects for all the Medicines you take and that have been prescribed to you. Take any new Medicines after you have completely understood and accpet all the possible adverse reactions/side effects.   Do not drive when taking Pain medications or sleeping medications (Benzodaizepines)  Do not take more than prescribed Pain, Sleep and Anxiety Medications. It is not advisable to combine anxiety,sleep and pain medications without talking with your primary care practitioner  Special Instructions: If you have smoked or chewed Tobacco  in the last 2 yrs please stop smoking, stop any regular Alcohol  and or any Recreational drug use.  Wear Seat belts while driving.  Please note: You were cared for by a hospitalist during your hospital stay. Once you are discharged, your primary care physician will handle any further medical issues. Please note that NO REFILLS for any discharge  medications will be authorized once you are discharged, as it is imperative that you return to your primary care physician (or establish a relationship with a primary care physician if you do not have one) for your post hospital discharge needs so that they can reassess your need for medications and monitor your lab values.  Total Time spent coordinating discharge including counseling, education and face to face time equals 45 minutes.  SignedJeoffrey Massed 07/31/2018 9:04 AM

## 2018-07-31 NOTE — Progress Notes (Signed)
Physical Therapy Treatment Patient Details Name: Rachel Rangel MRN: [id number] DOB: 08/03/1934 Today's Date: 07/31/2018    History of Present Illness 84 y.o. female with medical history significant of dementia, HLD, Dm, and HTN and prresenting after a mechanical fall. Imaging of Lumbar region showing Multiple osteoporotic type fractures throughout the lower thoracic and lumbar region, age uncertain. Pt with fall last Thursday 07/23/18 and xray/CT did not reveal injuries.    PT Comments    Pt was in sleeping in bed upon arrival. She was hesitant to participate in therapy, but agreed. Pt presented with anxiety and fear of falling and c/o pain "everywhere in body". Pt required constant reassurance to be calm and participate in session. Pt's nurse was notified post session to give pain meds to pt, but reported she has already had them this a.m. Continue to work on UE and LE strength to help aide in bed mobility and transfers. Pt would benefit from cont'd PT in order to progress toward stated goals and to maximize independence. Plan remains appropriate for SNF based on current function.    Follow Up Recommendations  SNF;Supervision/Assistance - 24 hour     Equipment Recommendations       Recommendations for Other Services       Precautions / Restrictions Precautions Precautions: Fall Restrictions Weight Bearing Restrictions: No    Mobility  Bed Mobility Overal bed mobility: Needs Assistance Bed Mobility: Rolling;Sidelying to Sit Rolling: Max assist;+2 for physical assistance Sidelying to sit: Max assist;+2 for physical assistance     Sit to sidelying: Total assist;+2 for safety/equipment General bed mobility comments: cues for direction. Needed assist to move legs off EOB and elevate trunk into sitting. With movement she c/o pain "everywhere" and to "stop". Pt had significant anxiety throught session and needed continous reassurance. To return to bed pt required increased  assistance to lower trunk and lift B LEs against gravity. +2 total to scoot to [facility]. Pt placed in chair position post session to improve tolerance to sitting and lung function.   Transfers                    Ambulation/Gait                 Stairs             Wheelchair Mobility    Modified Rankin (Stroke Patients Only)       Balance Overall balance assessment: Needs assistance Sitting-balance support: Feet supported;Bilateral upper extremity supported Sitting balance-Leahy Scale: Poor Sitting balance - Comments: Pt presented with anxiety of falling when asked to reach across midline sitting on EOB. Pt reached across midline 2 times with guidance.                                    Cognition Arousal/Alertness: Awake/alert Behavior During Therapy: Anxious Overall Cognitive Status: History of cognitive impairments - at baseline                                 General Comments: Pt with baseline dementia. Oriented to self. When asked where she was she reponded "[place]" and stated she wasn't aware she was in the hopsital or why she was here.      Exercises Total Joint Exercises Ankle Circles/Pumps: AROM;10 reps;Right;Left General Exercises - Upper Extremity Elbow Flexion: AAROM;10 reps;Right;Left Elbow Extension:  AAROM;10 reps;Right;Left Other Exercises Other Exercises: chest press AAROM x 10 BUE     General Comments        Pertinent Vitals/Pain Pain Assessment: Faces Faces Pain Scale: Hurts little more Pain Descriptors / Indicators: (Verbalized pain is everywhere) Pain Intervention(s): Monitored during session;Limited activity within patient's tolerance    Home Living                      Prior Function            PT Goals (current goals can now be found in the care plan section) Acute Rehab PT Goals Patient Stated Goal: Stop pain PT Goal Formulation: With patient Time For Goal Achievement:  08/12/18 Potential to Achieve Goals: Fair Progress towards PT goals: Progressing toward goals    Frequency    Min 3X/week      PT Plan Current plan remains appropriate    Co-evaluation              AM-PAC PT "6 Clicks" Daily Activity  Outcome Measure  Difficulty turning over in bed (including adjusting bedclothes, sheets and blankets)?: Unable Difficulty moving from lying on back to sitting on the side of the bed? : Unable Difficulty sitting down on and standing up from a chair with arms (e.g., wheelchair, bedside commode, etc,.)?: Unable Help needed moving to and from a bed to chair (including a wheelchair)?: Total Help needed walking in hospital room?: Total Help needed climbing 3-5 steps with a railing? : Total 6 Click Score: 6    End of Session     Patient left: in bed;with call bell/phone within reach;with bed alarm set Nurse Communication: Mobility status;Patient requests pain meds PT Visit Diagnosis: Other abnormalities of gait and mobility (R26.89);Difficulty in walking, not elsewhere classified (R26.2);Pain     Time: 9147-8295 PT Time Calculation (min) (ACUTE ONLY): 29 min  Charges:  $Therapeutic Exercise: 8-22 mins $Therapeutic Activity: 8-22 mins                     [address] SPTA   [place] 07/31/2018, 12:56 PM

## 2018-07-31 NOTE — Progress Notes (Signed)
Nsg Discharge Note  Admit Date:  07/28/2018 Discharge date: 07/31/2018   Rachel Rangel to be D/C'd Nursing Home, [facility], report given to [facility] RN, per MD order.  AVS completed.  Copy for chart, and copy for nursing home sent with EMS, and dated. Nurse, Cherrelle RN able to verbalize understanding.  Discharge Medication: Allergies as of 07/31/2018      Reactions   Lipitor [atorvastatin] Other (See Comments)   Intolerant to 40 MG due to myalgias, however tolerates 20 MG dose   Penicillins    Per [facility] and MAR Has patient had a PCN reaction causing immediate rash, facial/tongue/throat swelling, SOB or lightheadedness with hypotension: Unk Has patient had a PCN reaction causing severe rash involving mucus membranes or skin necrosis: Unk Has patient had a PCN reaction that required hospitalization: Unk Has patient had a PCN reaction occurring within the last 10 years: Unk      Medication List    STOP taking these medications   ibuprofen 200 MG tablet Commonly known as:  ADVIL,MOTRIN     TAKE these medications   amLODipine 10 MG tablet Commonly known as:  NORVASC Take 10 mg by mouth daily.   APAP 500 PO Take 500 mg by mouth every 6 (six) hours as needed (pain).   citalopram 10 MG tablet Commonly known as:  CELEXA Take 1 tablet (10 mg total) by mouth daily.   docusate sodium 100 MG capsule Commonly known as:  COLACE Take 1 capsule (100 mg total) by mouth 2 (two) times daily. What changed:    when to take this  reasons to take this   donepezil 10 MG tablet Commonly known as:  ARICEPT Take 1 tablet (10 mg total) by mouth at bedtime. Start with 1/2 tablet for 2 weeks then increase to a whole tablet What changed:  additional instructions   furosemide 20 MG tablet Commonly known as:  LASIX Take 2 tablets (40 mg total) by mouth daily. What changed:    how much to take  when to take this   irbesartan 300 MG tablet Commonly known as:  AVAPRO Take 300 mg by  mouth daily.   loratadine 10 MG tablet Commonly known as:  CLARITIN Take 10 mg by mouth daily as needed for allergies.   metFORMIN 1000 MG tablet Commonly known as:  GLUCOPHAGE Take 1 tablet (1,000 mg total) by mouth 2 (two) times daily with a meal.   Mineral Oil Oil Place 2 drops into both ears every Wednesday.   nitrofurantoin (macrocrystal-monohydrate) 100 MG capsule Commonly known as:  MACROBID Take 100 mg by mouth 2 (two) times daily. FOR 10 DAYS   nystatin cream Commonly known as:  MYCOSTATIN Apply 1 application topically See admin instructions. Apply to groin and buttocks 2 times a day   ondansetron 4 MG tablet Commonly known as:  ZOFRAN Take 4 mg by mouth every 8 (eight) hours as needed for nausea or vomiting.   potassium chloride 10 MEQ tablet Commonly known as:  K-DUR,KLOR-CON Take 10 mEq by mouth daily.   rosuvastatin 5 MG tablet Commonly known as:  CRESTOR Take 5 mg by mouth at bedtime.   Vitamin D3 2000 units capsule Take 2,000 Units by mouth daily.       Discharge Assessment: Vitals:   07/31/18 0409 07/31/18 1348  BP: (!) 186/74 (!) 180/79  Pulse: 73 74  Resp: 17   Temp: 98.5 F (36.9 C) 98.7 F (37.1 C)  SpO2: 96% 96%   Skin  clean, dry and intact without evidence of skin break down, no evidence of skin tears noted. IV catheter discontinued intact. Site without signs and symptoms of complications - no redness or edema noted at insertion site, patient denies c/o pain - only slight tenderness at site.  Dressing with slight pressure applied.  D/c Instructions-Education: Discharge instructions given to EMS with verbalized understanding. D/c education completed with Cherelle RN including follow up instructions, medication list, d/c activities limitations if indicated, with other d/c instructions as indicated by MD - Cherelle able to verbalize understanding, all questions fully answered. Patient instructed to return to ED, call 911, or call MD for any  changes in condition.  Patient transported by Sharin Mons to [facility].  Eddie Dibbles, RN 07/31/2018 2:38 PM

## 2018-07-31 NOTE — Clinical Social Work Placement (Signed)
   CLINICAL SOCIAL WORK PLACEMENT  NOTE  Date:  07/31/2018  Patient Details  Name: Rachel Rangel MRN: [id number] Date of Birth: 1934-08-02  Clinical Social Work is seeking post-discharge placement for this patient at the Skilled  Nursing Facility level of care (*CSW will initial, date and re-position this form in  chart as items are completed):  Yes   Patient/family provided with [facility] Clinical Social Work Department's list of facilities offering this level of care within the geographic area requested by the patient (or if unable, by the patient's family).  Yes   Patient/family informed of their freedom to choose among providers that offer the needed level of care, that participate in Medicare, Medicaid or managed care program needed by the patient, have an available bed and are willing to accept the patient.  Yes   Patient/family informed of [facility]'s ownership interest in [facility] and [facility], as well as of the fact that they are under no obligation to receive care at these facilities.  PASRR submitted to EDS on       PASRR number received on       Existing PASRR number confirmed on 07/31/18     FL2 transmitted to all facilities in geographic area requested by pt/family on 07/29/18     FL2 transmitted to all facilities within larger geographic area on       Patient informed that his/her managed care company has contracts with or will negotiate with certain facilities, including the following:  [facility]     Yes   Patient/family informed of bed offers received.  Patient chooses bed at [facility]     Physician recommends and patient chooses bed at      Patient to be transferred to [facility] on 07/31/18.  Patient to be transferred to facility by [facility]     Patient family notified on 07/31/18 of transfer.  Name of family member notified:  Ambrose Mantle, daughter     PHYSICIAN       Additional Comment:     _______________________________________________ Abigail Butts, LCSW 07/31/2018, 12:09 PM

## 2018-07-31 NOTE — Progress Notes (Signed)
Patient will discharge to [facility]. Anticipated discharge date: 07/31/18 Family notified: Ambrose Mantle, daughter Transportation by: Sharin Mons  Nurse to call report to [phone]. Patient will go to room 401 at the facility.  CSW signing off.  Abigail Butts, LCSWA  Clinical Social Worker

## 2019-01-05 ENCOUNTER — Ambulatory Visit: Payer: Self-pay | Admitting: Cardiology

## 2019-02-13 NOTE — Patient Instructions (Signed)
Subjective:  Primary Physician:  Patient, No Pcp Per  Patient ID: Rachel Rangel, female    DOB: 1933-12-27, 85 y.o.   MRN: [id number]  No chief complaint on file.   HPI: Rachel Rangel a 85 y.o. female, who presents with congestive heart failure. Rachel Rangel Rangel 85 year old Caucasian female, resident of [facility], She has COPD with pulmonary hypertension, HFpEF, mild AS, past history of tobacco abuse.  Overall, she Rangel doing fair. Patient's activities are very limited due to multiple comorbidities. She Rangel mostly in the wheelchair but Rangel able to walk a few steps with help. She denies dyspnea at rest and no orthopnea or PND. No complaints of chest pain. No c/o palpitation, dizziness, near-syncope or syncope. No known history of sleep apnea. She had leg swelling, improved with diuretic therapy. No history of DVT or pulmonary embolism.  Patient has hypertension, diabetes mellitus type 2, hypercholesterolemia, obesity. She had smoked one pack per day for almost 60 years, quit smoking in 2016.  Patient has back pain, history of T12 fracture. She has arthritis. She also has history of memory loss.   Past Medical History:  Diagnosis Date  . Benign hypertension   . Congestive heart failure (CHF) (HCC)    grade 1 diastolic dysfunction in 11/18  . Dementia (HCC)   . Diabetes mellitus without complication (HCC)   . Edema    Left pedal   . History of complete eye exam 12/25/2012   Normal eye exam, no retinopathy [facility]  . Hyperlipidemia   . Osteoporosis 2010  . Overactive bladder   . Vitamin D deficiency     Past Surgical History:  Procedure Laterality Date  . ABDOMINAL HYSTERECTOMY  1975   partial    Social History   Socioeconomic History  . Marital status: Widowed    Spouse name: Not on file  . Number of children: 1  . Years of education: College  . Highest education level: Not on file  Occupational History  . Occupation:  Retired Data processing manager  . Financial resource strain: Not on file  . Food insecurity:    Worry: Not on file    Inability: Not on file  . Transportation needs:    Medical: Not on file    Non-medical: Not on file  Tobacco Use  . Smoking status: Former Smoker    Packs/day: 1.00    Years: 40.00    Pack years: 40.00    Types: Cigarettes    Last attempt to quit: 09/30/2014    Years since quitting: 4.3  . Smokeless tobacco: Never Used  Substance and Sexual Activity  . Alcohol use: Yes    Alcohol/week: 0.0 standard drinks    Comment: ocass  . Drug use: No  . Sexual activity: Not on file  Lifestyle  . Physical activity:    Days per week: Not on file    Minutes per session: Not on file  . Stress: Not on file  Relationships  . Social connections:    Talks on phone: Not on file    Gets together: Not on file    Attends religious service: Not on file    Active member of club or organization: Not on file    Attends meetings of clubs or organizations: Not on file    Relationship status: Not on file  . Intimate partner violence:    Fear of current or ex partner: Not on file  Emotionally abused: Not on file    Physically abused: Not on file    Forced sexual activity: Not on file  Other Topics Concern  . Not on file  Social History Narrative   Lives at [facility] [place]. Moved there in January 2017.       Caffeine use: ocass    Current Outpatient Medications on File Prior to Visit  Medication Sig Dispense Refill  . Acetaminophen (APAP 500 PO) Take 500 mg by mouth every 6 (six) hours as needed (pain).     Marland Kitchen. amLODipine (NORVASC) 10 MG tablet Take 10 mg by mouth daily.    . Cholecalciferol (VITAMIN D3) 2000 UNITS capsule Take 2,000 Units by mouth daily.     . citalopram (CELEXA) 10 MG tablet Take 1 tablet (10 mg total) by mouth daily. 30 tablet 11  . docusate sodium (COLACE) 100 MG capsule Take 1 capsule (100 mg total) by mouth 2 (two) times daily. (Patient taking  differently: Take 100 mg by mouth 2 (two) times daily as needed for mild constipation. ) 10 capsule 0  . donepezil (ARICEPT) 10 MG tablet Take 1 tablet (10 mg total) by mouth at bedtime. Start with 1/2 tablet for 2 weeks then increase to a whole tablet (Patient taking differently: Take 10 mg by mouth at bedtime. ) 30 tablet 3  . furosemide (LASIX) 20 MG tablet Take 2 tablets (40 mg total) by mouth daily. 30 tablet 0  . irbesartan (AVAPRO) 300 MG tablet Take 300 mg by mouth daily.    Marland Kitchen. loratadine (CLARITIN) 10 MG tablet Take 10 mg by mouth daily as needed for allergies.    . metFORMIN (GLUCOPHAGE) 1000 MG tablet Take 1 tablet (1,000 mg total) by mouth 2 (two) times daily with a meal. 60 tablet 0  . Mineral Oil OIL Place 2 drops into both ears every Wednesday.    . nitrofurantoin, macrocrystal-monohydrate, (MACROBID) 100 MG capsule Take 100 mg by mouth 2 (two) times daily. FOR 10 DAYS    . nystatin cream (MYCOSTATIN) Apply 1 application topically See admin instructions. Apply to groin and buttocks 2 times a day    . ondansetron (ZOFRAN) 4 MG tablet Take 4 mg by mouth every 8 (eight) hours as needed for nausea or vomiting.    . potassium chloride (K-DUR,KLOR-CON) 10 MEQ tablet Take 10 mEq by mouth daily.    . rosuvastatin (CRESTOR) 5 MG tablet Take 5 mg by mouth at bedtime.      No current facility-administered medications on file prior to visit.     @ROS @   @SUBJECTIVEEND @  Objective:  There were no vitals taken for this visit. There Rangel no height or weight on file to calculate BMI.  @PHYSICALEXAMCV @  CARDIAC STUDIES:   Echocardiogram 02/06/2017: Left ventricle cavity Rangel normal in size. Normal global wall motion. Doppler evidence of grade II (pseudonormal) diastolic dysfunction, elevated LAP. Calculated EF 72%. Left atrial cavity Rangel mildly dilated at 4.1 cm. Right atrial cavity Rangel mild to moderately dilated. Trace aortic valve stenosis. Aortic valve peak gradient of 10 mmHg, mean gradient  9.5 mmHg, calculated aortic valve area by planimetry 1.04 cm. Mild tricuspid regurgitation. Moderate pulmonary hypertension. PA pressure estimated at 40 mm Hg. No significant change from report of Echo performed on 08/02/2016, PA pressure 57 mm Hg. Impression:   CT angiogram chest 09/26/2016: No evidence of PE, pulmonary artery Rangel mildly enlarged, atherosclerosis, mild atelectasis versus edema.  Assessment & Recommendations:   1. Chronic heart failure  with preserved ejection fraction (HCC)  2. Essential hypertension  3. Obesity (BMI 30-39.9)   Laboratory Exam:  CBC Latest Ref Rng & Units 07/29/2018 07/28/2018 07/23/2018  WBC 4.0 - 10.5 K/uL 11.9(H) 12.5(H) 15.4(H)  Hemoglobin 12.0 - 15.0 g/dL 10.4(L) 11.6(L) 12.0  Hematocrit 36.0 - 46.0 % 31.5(L) 36.1 37.6  Platelets 150 - 400 K/uL 193 222 270   CMP Latest Ref Rng & Units 07/29/2018 07/28/2018 07/23/2018  Glucose 70 - 99 mg/dL 189(Q) 421(I) 312(O)  BUN 8 - 23 mg/dL 11(W) 22 86(L)  Creatinine 0.44 - 1.00 mg/dL 7.37(V) 6.68(D) 5.94(L)  Sodium 135 - 145 mmol/L 134(L) 134(L) 136  Potassium 3.5 - 5.1 mmol/L 4.2 3.9 4.5  Chloride 98 - 111 mmol/L 104 104 99  CO2 22 - 32 mmol/L 22 16(L) 23  Calcium 8.9 - 10.3 mg/dL 9.6 9.3 10.4(H)  Total Protein 6.5 - 8.1 g/dL - 6.5 7.9  Total Bilirubin 0.3 - 1.2 mg/dL - 1.8(H) 0.9  Alkaline Phos 38 - 126 U/L - 64 70  AST 15 - 41 U/L - 23 17  ALT 0 - 44 U/L - 21 15   Lipid Panel  No results found for: CHOL, TRIG, HDL, CHOLHDL, VLDL, LDLCALC, LDLDIRECT   Recommendation:  CHF Rangel fairly compensated. Overall, patient Rangel doing fair. Her systolic blood pressure Rangel elevated. I have increased the dose of irbesartan to 300 mg daily. Continue other medications. She will have blood pressure follow-up at assisted-living facility. She will also have a BMP after 2 weeks to check renal function, electrolytes.  I will see her in follow-up after 6 months.  Earl Many, MD, [facility] 02/13/2019, 4:02 PM  [facility] Pager: [phone] Office: [phone] If no answer Cell [phone]

## 2019-02-15 NOTE — Progress Notes (Signed)
Subjective:  Primary Physician:  Patient, No Pcp Per  Patient ID: Rachel Rangel, female    DOB: [birth year], 85 y.o.   MRN: [id number]  This visit type was conducted due to national recommendations for restrictions regarding the COVID-19 Pandemic (e.g. social distancing).  This format is felt to be most appropriate for this patient at this time.  All issues noted in this document were discussed and addressed.  No physical exam was performed (except for noted visual exam findings with Telehealth visits - very limited).  The patient has consented to conduct a Telehealth visit and understands insurance will be billed.   I connected with patient, on 02/16/19  by a telemedicine application and verified that I am speaking with the correct person using two identifiers.     I discussed the limitations of evaluation and management by telemedicine and the availability of in person appointments. The patient expressed understanding and agreed to proceed.   I have discussed with patient regarding the safety during COVID Pandemic and steps and precautions including social distancing with the patient.    Chief Complaint  Patient presents with  . Congestive Heart Failure    6 month f/u   . Hypertension  . Follow-up    HPI: Rachel Rangel  is a 85 y.o. female . She is resident of [facility], She has COPD with pulmonary hypertension, HFpEF, mild AS, past history of tobacco abuse.  History was obtained with the help of  caregiver at assisted living facility. Overall, she is doing fair. Patient's activities are very limited due to multiple comorbidities. She is mostly in the wheelchair but is able to walk a few steps with help. She denies dyspnea at rest and no orthopnea or PND. No complaints of chest pain. No c/o palpitation, dizziness, near-syncope or syncope. No known history of sleep apnea. She hadleg swelling, improved with diuretic therapy. No history of DVT or  pulmonary embolism.  Patient has hypertension, diabetes mellitus type 2 with chronic kidney disease, hypercholesterolemia and mild obesity. She had smoked one pack per day for almost 60 years, quit smoking in 2016.  Patient has back pain, history of T12 fracture. She has arthritis. She also has history of memory loss.   Past Medical History:  Diagnosis Date  . Benign hypertension   . Congestive heart failure (CHF) (HCC)    grade 1 diastolic dysfunction in 11/18  . Dementia (HCC)   . Diabetes mellitus without complication (HCC)   . Edema    Left pedal   . History of complete eye exam 12/25/2012   Normal eye exam, no retinopathy [facility]  . Hyperlipidemia   . Osteoporosis 2010  . Overactive bladder   . Vitamin D deficiency     Past Surgical History:  Procedure Laterality Date  . ABDOMINAL HYSTERECTOMY  1975   partial    Social History   Socioeconomic History  . Marital status: Widowed    Spouse name: Not on file  . Number of children: 1  . Years of education: College  . Highest education level: Not on file  Occupational History  . Occupation: Retired Data processing manager  . Financial resource strain: Not on file  . Food insecurity:    Worry: Not on file    Inability: Not on file  . Transportation needs:    Medical: Not on file    Non-medical: Not on file  Tobacco Use  . Smoking status: Former Smoker  Packs/day: 1.00    Years: 40.00    Pack years: 40.00    Types: Cigarettes    Last attempt to quit: 09/30/2014    Years since quitting: 4.3  . Smokeless tobacco: Never Used  Substance and Sexual Activity  . Alcohol use: Yes    Alcohol/week: 0.0 standard drinks    Comment: ocass  . Drug use: No  . Sexual activity: Not on file  Lifestyle  . Physical activity:    Days per week: Not on file    Minutes per session: Not on file  . Stress: Not on file  Relationships  . Social connections:    Talks on phone: Not on file    Gets together: Not on file     Attends religious service: Not on file    Active member of club or organization: Not on file    Attends meetings of clubs or organizations: Not on file    Relationship status: Not on file  . Intimate partner violence:    Fear of current or ex partner: Not on file    Emotionally abused: Not on file    Physically abused: Not on file    Forced sexual activity: Not on file  Other Topics Concern  . Not on file  Social History Narrative   Lives at [facility] [place]. Moved there in January 2017.       Caffeine use: ocass    Current Outpatient Medications on File Prior to Visit  Medication Sig Dispense Refill  . amLODipine (NORVASC) 10 MG tablet Take 10 mg by mouth daily.    . Cholecalciferol (VITAMIN D3) 2000 UNITS capsule Take 2,000 Units by mouth daily.     . citalopram (CELEXA) 10 MG tablet Take 1 tablet (10 mg total) by mouth daily. 30 tablet 11  . docusate sodium (COLACE) 100 MG capsule Take 1 capsule (100 mg total) by mouth 2 (two) times daily. (Patient taking differently: Take 100 mg by mouth 2 (two) times daily as needed for mild constipation. ) 10 capsule 0  . donepezil (ARICEPT) 10 MG tablet Take 1 tablet (10 mg total) by mouth at bedtime. Start with 1/2 tablet for 2 weeks then increase to a whole tablet (Patient taking differently: Take 10 mg by mouth at bedtime. ) 30 tablet 3  . furosemide (LASIX) 40 MG tablet Take 40 mg by mouth.    . irbesartan (AVAPRO) 300 MG tablet Take 300 mg by mouth daily.    . Lido-Capsaicin-Men-Methyl Sal 0.5-0.035-5-20 % PTCH Apply 1 patch topically daily.    Marland Kitchen loratadine (CLARITIN) 10 MG tablet Take 10 mg by mouth daily as needed for allergies.    . metFORMIN (GLUCOPHAGE) 1000 MG tablet Take 1,000 mg by mouth 2 (two) times a day.    . nitrofurantoin, macrocrystal-monohydrate, (MACROBID) 100 MG capsule Take 100 mg by mouth 2 (two) times daily. FOR 10 DAYS    . omeprazole (PRILOSEC) 20 MG capsule Take 20 mg by mouth daily.    . ondansetron  (ZOFRAN) 4 MG tablet Take 4 mg by mouth every 8 (eight) hours as needed for nausea or vomiting.    . potassium chloride (K-DUR,KLOR-CON) 10 MEQ tablet Take 10 mEq by mouth daily.    . rosuvastatin (CRESTOR) 5 MG tablet Take 5 mg by mouth at bedtime.     Marland Kitchen terazosin (HYTRIN) 1 MG capsule Take 1 capsule by mouth daily.    . Acetaminophen (APAP 500 PO) Take 500 mg by mouth every  6 (six) hours as needed (pain).     . metFORMIN (GLUCOPHAGE) 1000 MG tablet Take 1 tablet (1,000 mg total) by mouth 2 (two) times daily with a meal. 60 tablet 0  . traZODone (DESYREL) 50 MG tablet Take 1 tablet by mouth daily.     No current facility-administered medications on file prior to visit.     Review of Systems  Constitutional: Negative for fever.       Feels chronically tired.  HENT: Negative for nosebleeds.   Eyes: Negative for blurred vision.  Respiratory: Positive for shortness of breath (on exertion). Negative for cough.   Cardiovascular: Negative for chest pain, palpitations and leg swelling.  Gastrointestinal: Negative for abdominal pain, nausea and vomiting.  Genitourinary: Negative for dysuria.  Musculoskeletal: Positive for back pain. Negative for myalgias.  Skin: Negative for itching and rash.  Neurological: Negative for dizziness, seizures and loss of consciousness.  Psychiatric/Behavioral: The patient is not nervous/anxious.        Objective:  Blood pressure 130/74, pulse 65, weight 160 lb 3.2 oz (72.7 kg). Body mass index is 29.3 kg/m.  Physical Exam: Patient is alert, appeared  comfortable talking to me during the visit. No further detailed physical examination was possible as it was a telemedicine visit.   CARDIAC STUDIES:  Echocardiogram 02/06/2017: Left ventricle cavity is normal in size. Normal global wall motion. Doppler evidence of grade II (pseudonormal) diastolic dysfunction, elevated LAP. Calculated EF 72%. Left atrial cavity is mildly dilated at 4.1 cm. Right atrial cavity  is mild to moderately dilated. Trace aortic valve stenosis. Aortic valve peak gradient of 10 mmHg, mean gradient 9.5 mmHg, calculated aortic valve area by planimetry 1.04 cm. Mild tricuspid regurgitation. Moderate pulmonary hypertension. PA pressure estimated at 40 mm Hg. No significant change from report of Echo performed on 08/02/2016, PA pressure 57 mm Hg. CT angiogram chest 09/26/2016: No evidence of PE, pulmonary artery is mildly enlarged, atherosclerosis, mild atelectasis versus edema.  Assessment & Recommendations:   1. Chronic heart failure with preserved ejection fraction (HCC)  2. Essential hypertension  3. Obesity (BMI 30-39.9)   Laboratory Exam: 11/10/2018-glucose-230, BUN-34, creatinine-2.0, Sodium-140, potassium-3.8, SGOT-10, SGPT-8 WBC-8.2, hemoglobin-11.9, hematocrit-35.7, platelets-244.  CBC Latest Ref Rng & Units 07/29/2018 07/28/2018 07/23/2018  WBC 4.0 - 10.5 K/uL 11.9(H) 12.5(H) 15.4(H)  Hemoglobin 12.0 - 15.0 g/dL 10.4(L) 11.6(L) 12.0  Hematocrit 36.0 - 46.0 % 31.5(L) 36.1 37.6  Platelets 150 - 400 K/uL 193 222 270   CMP Latest Ref Rng & Units 07/29/2018 07/28/2018 07/23/2018  Glucose 70 - 99 mg/dL 696(E193(H) 952(W213(H) 413(K243(H)  BUN 8 - 23 mg/dL 44(W24(H) 22 10(U28(H)  Creatinine 0.44 - 1.00 mg/dL 7.25(D1.47(H) 6.64(Q1.58(H) 0.34(V1.85(H)  Sodium 135 - 145 mmol/L 134(L) 134(L) 136  Potassium 3.5 - 5.1 mmol/L 4.2 3.9 4.5  Chloride 98 - 111 mmol/L 104 104 99  CO2 22 - 32 mmol/L 22 16(L) 23  Calcium 8.9 - 10.3 mg/dL 9.6 9.3 10.4(H)  Total Protein 6.5 - 8.1 g/dL - 6.5 7.9  Total Bilirubin 0.3 - 1.2 mg/dL - 1.8(H) 0.9  Alkaline Phos 38 - 126 U/L - 64 70  AST 15 - 41 U/L - 23 17  ALT 0 - 44 U/L - 21 15   Lipid Panel  No results found for: CHOL, TRIG, HDL, CHOLHDL, VLDL, LDLCALC, LDLDIRECT   Recommendation:  CHF remains fairly compensated. Overall, patient is doing fair. Leg edema has resolved with furosemide therapy. Blood pressure is well controlled, it has been monitored regularly at  assisted-living facility. Continue present medications.   Continue ADA, low-salt, low-cholesterol diet.  I will see her in follow-up after 6 months.   Earl Many, MD, [facility] 02/16/2019, 12:24 PM [facility] Pager: [phone] Office: [phone] If no answer Cell [phone]

## 2019-02-16 ENCOUNTER — Encounter: Payer: Self-pay | Admitting: Cardiology

## 2019-02-16 ENCOUNTER — Ambulatory Visit (INDEPENDENT_AMBULATORY_CARE_PROVIDER_SITE_OTHER): Payer: Medicare Other | Admitting: Cardiology

## 2019-02-16 ENCOUNTER — Other Ambulatory Visit: Payer: Self-pay

## 2019-02-16 VITALS — BP 130/74 | HR 65 | Wt 160.2 lb

## 2019-02-16 DIAGNOSIS — E669 Obesity, unspecified: Secondary | ICD-10-CM | POA: Diagnosis not present

## 2019-02-16 DIAGNOSIS — I5032 Chronic diastolic (congestive) heart failure: Secondary | ICD-10-CM | POA: Diagnosis not present

## 2019-02-16 DIAGNOSIS — I10 Essential (primary) hypertension: Secondary | ICD-10-CM

## 2019-08-16 ENCOUNTER — Ambulatory Visit: Payer: Medicare Other | Admitting: Cardiology

## 2019-08-18 ENCOUNTER — Other Ambulatory Visit: Payer: Self-pay

## 2019-08-18 ENCOUNTER — Encounter: Payer: Self-pay | Admitting: Cardiology

## 2019-08-18 ENCOUNTER — Telehealth (INDEPENDENT_AMBULATORY_CARE_PROVIDER_SITE_OTHER): Payer: Medicare Other | Admitting: Cardiology

## 2019-08-18 VITALS — BP 130/80 | Wt 161.4 lb

## 2019-08-18 DIAGNOSIS — I10 Essential (primary) hypertension: Secondary | ICD-10-CM | POA: Diagnosis not present

## 2019-08-18 DIAGNOSIS — E669 Obesity, unspecified: Secondary | ICD-10-CM

## 2019-08-18 DIAGNOSIS — I5032 Chronic diastolic (congestive) heart failure: Secondary | ICD-10-CM

## 2019-08-18 NOTE — Progress Notes (Signed)
Follow up visit  Subjective:   Rachel Rangel, female    DOB: 1934-07-09, 85 y.o.   MRN: [id number]   Chief Complaint  Patient presents with  . Congestive Heart Failure  . Follow-up    95mo    HPI  85 year old Caucasian female, nursing home resident, COPD, HFpEF, mild AS, history of tobacco abuse, pulmonary hypertension, likely WHO group II/III.  Patient was last seen by Dr. Woody Seller in 06/2018.  Her irbesartan was increased to 300 mg daily for better blood pressure control.  Patient currently lives in [place] facility. I spoke over the phone with her nurse Carlos Levering.   Patient is currently taking a nap. She is working with therapy, to propel out of wheelchair. She has had vertigo off and on, which is managed by Dr Gwyneth Sprout from [facility] Her leg edema is overall stable with lasix.  Current Outpatient Medications on File Prior to Visit  Medication Sig Dispense Refill  . Acetaminophen (APAP 500 PO) Take 500 mg by mouth every 6 (six) hours as needed (pain).     Marland Kitchen amLODipine (NORVASC) 10 MG tablet Take 10 mg by mouth daily.    . Cholecalciferol (VITAMIN D3) 2000 UNITS capsule Take 2,000 Units by mouth daily.     . citalopram (CELEXA) 10 MG tablet Take 1 tablet (10 mg total) by mouth daily. 30 tablet 11  . docusate sodium (COLACE) 100 MG capsule Take 1 capsule (100 mg total) by mouth 2 (two) times daily. (Patient taking differently: Take 100 mg by mouth 2 (two) times daily as needed for mild constipation. ) 10 capsule 0  . donepezil (ARICEPT) 10 MG tablet Take 1 tablet (10 mg total) by mouth at bedtime. Start with 1/2 tablet for 2 weeks then increase to a whole tablet (Patient taking differently: Take 10 mg by mouth at bedtime. ) 30 tablet 3  . furosemide (LASIX) 40 MG tablet Take 40 mg by mouth.    . irbesartan (AVAPRO) 300 MG tablet Take 300 mg by mouth daily.    . Lido-Capsaicin-Men-Methyl Sal 0.5-0.035-5-20 % PTCH Apply 1 patch topically daily.    Marland Kitchen  loratadine (CLARITIN) 10 MG tablet Take 10 mg by mouth daily as needed for allergies.    . metFORMIN (GLUCOPHAGE) 1000 MG tablet Take 1 tablet (1,000 mg total) by mouth 2 (two) times daily with a meal. 60 tablet 0  . metFORMIN (GLUCOPHAGE) 1000 MG tablet Take 1,000 mg by mouth 2 (two) times a day.    . nitrofurantoin, macrocrystal-monohydrate, (MACROBID) 100 MG capsule Take 100 mg by mouth 2 (two) times daily. FOR 10 DAYS    . omeprazole (PRILOSEC) 20 MG capsule Take 20 mg by mouth daily.    . ondansetron (ZOFRAN) 4 MG tablet Take 4 mg by mouth every 8 (eight) hours as needed for nausea or vomiting.    . potassium chloride (K-DUR,KLOR-CON) 10 MEQ tablet Take 10 mEq by mouth daily.    . rosuvastatin (CRESTOR) 5 MG tablet Take 5 mg by mouth at bedtime.     Marland Kitchen terazosin (HYTRIN) 1 MG capsule Take 1 capsule by mouth daily.    . traZODone (DESYREL) 50 MG tablet Take 1 tablet by mouth daily.     No current facility-administered medications on file prior to visit.     Cardiovascular studies:  Echocardiogram 08/26/2017: - LVEF 65-70%, mild LVH, normal wall motion, grade 1 DD, elevated LV filling pressure, moderate aortic stenosis - mean gradient 14 mmHg, AVA around  1.4 cm2, normal LA size, moderate TR, RVSP 61 mmHg, dialted IVC.      Vitals:   08/18/19 1519  BP: 130/80  SpO2: 98%    Body mass index is 29.52 kg/m. Filed Weights   08/18/19 1519  Weight: 161 lb 6.4 oz (73.2 kg)    At this time, I recommend continued supportive care. Patient has had overall functional decline over the last few years. I do not recommend any further aggressive cardiac workup. Continue current management, including lasix. I will see her on as needed basis.  Time spent: 8 min  Sherryl Valido Emiliano Dyer, MD [facility] Pager: [phone] Office: [phone]

## 2019-10-06 ENCOUNTER — Emergency Department (HOSPITAL_COMMUNITY)
Admission: EM | Admit: 2019-10-06 | Discharge: 2019-11-01 | Disposition: E | Payer: Medicare PPO | Attending: Emergency Medicine | Admitting: Emergency Medicine

## 2019-10-06 ENCOUNTER — Emergency Department (HOSPITAL_COMMUNITY): Payer: Medicare PPO

## 2019-10-06 ENCOUNTER — Encounter (HOSPITAL_COMMUNITY): Payer: Self-pay | Admitting: Emergency Medicine

## 2019-10-06 DIAGNOSIS — J449 Chronic obstructive pulmonary disease, unspecified: Secondary | ICD-10-CM | POA: Diagnosis not present

## 2019-10-06 DIAGNOSIS — R4189 Other symptoms and signs involving cognitive functions and awareness: Secondary | ICD-10-CM | POA: Insufficient documentation

## 2019-10-06 DIAGNOSIS — I959 Hypotension, unspecified: Secondary | ICD-10-CM | POA: Diagnosis not present

## 2019-10-06 DIAGNOSIS — T68XXXA Hypothermia, initial encounter: Secondary | ICD-10-CM | POA: Diagnosis not present

## 2019-10-06 DIAGNOSIS — D539 Nutritional anemia, unspecified: Secondary | ICD-10-CM | POA: Insufficient documentation

## 2019-10-06 DIAGNOSIS — I5032 Chronic diastolic (congestive) heart failure: Secondary | ICD-10-CM | POA: Insufficient documentation

## 2019-10-06 DIAGNOSIS — F0390 Unspecified dementia without behavioral disturbance: Secondary | ICD-10-CM | POA: Diagnosis not present

## 2019-10-06 DIAGNOSIS — Z20822 Contact with and (suspected) exposure to covid-19: Secondary | ICD-10-CM | POA: Diagnosis not present

## 2019-10-06 DIAGNOSIS — X31XXXA Exposure to excessive natural cold, initial encounter: Secondary | ICD-10-CM | POA: Diagnosis not present

## 2019-10-06 DIAGNOSIS — Z79899 Other long term (current) drug therapy: Secondary | ICD-10-CM | POA: Insufficient documentation

## 2019-10-06 DIAGNOSIS — E1122 Type 2 diabetes mellitus with diabetic chronic kidney disease: Secondary | ICD-10-CM | POA: Diagnosis not present

## 2019-10-06 DIAGNOSIS — N189 Chronic kidney disease, unspecified: Secondary | ICD-10-CM | POA: Insufficient documentation

## 2019-10-06 DIAGNOSIS — Z87891 Personal history of nicotine dependence: Secondary | ICD-10-CM | POA: Diagnosis not present

## 2019-10-06 DIAGNOSIS — I130 Hypertensive heart and chronic kidney disease with heart failure and stage 1 through stage 4 chronic kidney disease, or unspecified chronic kidney disease: Secondary | ICD-10-CM | POA: Diagnosis not present

## 2019-10-06 DIAGNOSIS — I9589 Other hypotension: Secondary | ICD-10-CM

## 2019-10-06 LAB — CBG MONITORING, ED
Glucose-Capillary: 181 mg/dL — ABNORMAL HIGH (ref 70–99)
Glucose-Capillary: 47 mg/dL — ABNORMAL LOW (ref 70–99)

## 2019-10-06 LAB — CBC WITH DIFFERENTIAL/PLATELET
Abs Immature Granulocytes: 1.65 10*3/uL — ABNORMAL HIGH (ref 0.00–0.07)
Basophils Absolute: 0.1 10*3/uL (ref 0.0–0.1)
Basophils Relative: 0 %
Eosinophils Absolute: 0.1 10*3/uL (ref 0.0–0.5)
Eosinophils Relative: 0 %
HCT: 27.7 % — ABNORMAL LOW (ref 36.0–46.0)
Hemoglobin: 7.4 g/dL — ABNORMAL LOW (ref 12.0–15.0)
Immature Granulocytes: 7 %
Lymphocytes Relative: 17 %
Lymphs Abs: 3.8 10*3/uL (ref 0.7–4.0)
MCH: 31.5 pg (ref 26.0–34.0)
MCHC: 26.7 g/dL — ABNORMAL LOW (ref 30.0–36.0)
MCV: 117.9 fL — ABNORMAL HIGH (ref 80.0–100.0)
Monocytes Absolute: 1.3 10*3/uL — ABNORMAL HIGH (ref 0.1–1.0)
Monocytes Relative: 6 %
Neutro Abs: 16.1 10*3/uL — ABNORMAL HIGH (ref 1.7–7.7)
Neutrophils Relative %: 70 %
Platelets: 270 10*3/uL (ref 150–400)
RBC: 2.35 MIL/uL — ABNORMAL LOW (ref 3.87–5.11)
RDW: 13 % (ref 11.5–15.5)
WBC: 23 10*3/uL — ABNORMAL HIGH (ref 4.0–10.5)
nRBC: 0 % (ref 0.0–0.2)

## 2019-10-06 LAB — POCT I-STAT 7, (LYTES, BLD GAS, ICA,H+H)
Acid-base deficit: <30 mmol/L — ABNORMAL HIGH (ref 0.0–2.0)
Bicarbonate: 2.5 mmol/L — ABNORMAL LOW (ref 20.0–28.0)
Calcium, Ion: 1.07 mmol/L — ABNORMAL LOW (ref 1.15–1.40)
HCT: 16 % — ABNORMAL LOW (ref 36.0–46.0)
Hemoglobin: 5.4 g/dL — CL (ref 12.0–15.0)
O2 Saturation: 96 %
Potassium: 5.7 mmol/L — ABNORMAL HIGH (ref 3.5–5.1)
Sodium: 136 mmol/L (ref 135–145)
TCO2: <5 mmol/L — ABNORMAL LOW (ref 22–32)
pCO2 arterial: 22.5 mmHg — ABNORMAL LOW (ref 32.0–48.0)
pH, Arterial: 6.659 — CL (ref 7.350–7.450)
pO2, Arterial: 167 mmHg — ABNORMAL HIGH (ref 83.0–108.0)

## 2019-10-06 LAB — COMPREHENSIVE METABOLIC PANEL
ALT: 11 U/L (ref 0–44)
AST: 25 U/L (ref 15–41)
Albumin: 2.2 g/dL — ABNORMAL LOW (ref 3.5–5.0)
Alkaline Phosphatase: 52 U/L (ref 38–126)
BUN: 54 mg/dL — ABNORMAL HIGH (ref 8–23)
CO2: <7 mmol/L — ABNORMAL LOW (ref 22–32)
Calcium: 8.6 mg/dL — ABNORMAL LOW (ref 8.9–10.3)
Chloride: 105 mmol/L (ref 98–111)
Creatinine, Ser: 7.47 mg/dL — ABNORMAL HIGH (ref 0.44–1.00)
GFR calc Af Amer: 5 mL/min — ABNORMAL LOW (ref >60)
GFR calc non Af Amer: 5 mL/min — ABNORMAL LOW (ref >60)
Glucose, Bld: 60 mg/dL — ABNORMAL LOW (ref 70–99)
Potassium: 6.3 mmol/L (ref 3.5–5.1)
Sodium: 142 mmol/L (ref 135–145)
Total Bilirubin: 0.7 mg/dL (ref 0.3–1.2)
Total Protein: 4.4 g/dL — ABNORMAL LOW (ref 6.5–8.1)

## 2019-10-06 LAB — TSH: TSH: 1.684 u[IU]/mL (ref 0.350–4.500)

## 2019-10-06 LAB — RESPIRATORY PANEL BY RT PCR (FLU A&B, COVID)
Influenza A by PCR: NEGATIVE
Influenza B by PCR: NEGATIVE
SARS Coronavirus 2 by RT PCR: NEGATIVE

## 2019-10-06 LAB — TROPONIN I (HIGH SENSITIVITY): Troponin I (High Sensitivity): 39 ng/L — ABNORMAL HIGH (ref <18)

## 2019-10-06 LAB — T4, FREE: Free T4: 0.86 ng/dL (ref 0.61–1.12)

## 2019-10-06 LAB — LACTIC ACID, PLASMA: Lactic Acid, Venous: >11 mmol/L (ref 0.5–1.9)

## 2019-10-06 MED ORDER — LACTATED RINGERS IV BOLUS (SEPSIS): 1000.0000 mL | Freq: Once | INTRAVENOUS | Status: DC

## 2019-10-06 MED ORDER — VANCOMYCIN HCL 1500 MG/300ML IV SOLN
1500.0000 mg | Freq: Once | INTRAVENOUS | Status: AC
  Administered 2019-10-06: 1500 mg via INTRAVENOUS
  Filled 2019-10-06: qty 300

## 2019-10-06 MED ORDER — SODIUM CHLORIDE 0.9 % IV BOLUS
1000.0000 mL | Freq: Once | INTRAVENOUS | Status: AC
  Administered 2019-10-06: 1000 mL via INTRAVENOUS

## 2019-10-06 MED ORDER — VASOPRESSIN 20 UNIT/ML IV SOLN
0.0300 [IU]/min | INTRAVENOUS | Status: DC
  Filled 2019-10-06: qty 2

## 2019-10-06 MED ORDER — METRONIDAZOLE IN NACL 5-0.79 MG/ML-% IV SOLN
500.0000 mg | Freq: Once | INTRAVENOUS | Status: DC
  Filled 2019-10-06: qty 100

## 2019-10-06 MED ORDER — NOREPINEPHRINE 4 MG/250ML-% IV SOLN
0.0000 ug/min | INTRAVENOUS | Status: DC
  Administered 2019-10-06: 07:00:00 5 ug/min via INTRAVENOUS
  Filled 2019-10-06: qty 250

## 2019-10-06 MED ORDER — LACTATED RINGERS IV BOLUS (SEPSIS): 250.0000 mL | Freq: Once | INTRAVENOUS | Status: DC

## 2019-10-06 MED ORDER — DEXTROSE 50 % IV SOLN
50.0000 mL | Freq: Once | INTRAVENOUS | Status: AC
  Administered 2019-10-06: 50 mL via INTRAVENOUS

## 2019-10-06 MED ORDER — SODIUM CHLORIDE 0.9 % IV SOLN: 10.0000 mL/h | Freq: Once | INTRAVENOUS | Status: DC

## 2019-10-06 MED ORDER — DEXTROSE 50 % IV SOLN
INTRAVENOUS | Status: AC
  Filled 2019-10-06: qty 50

## 2019-10-06 MED ORDER — SODIUM CHLORIDE 0.9 % IV SOLN
2.0000 g | Freq: Once | INTRAVENOUS | Status: DC
  Filled 2019-10-06: qty 2

## 2019-10-11 LAB — CULTURE, BLOOD (ROUTINE X 2)
Culture: NO GROWTH
Culture: NO GROWTH
Special Requests: ADEQUATE
Special Requests: ADEQUATE

## 2019-11-01 NOTE — ED Triage Notes (Signed)
Per EMS, pt from [facility], was seen at 9pm, at 4am staff checked on her she responded she was "watching TV". Non responsive at 5am.  Some confusion at baseline but usually alert.  Was 91% RA but increased when put on 2L  Pupils are 21mm non reactive  62/34 HR 50  CGB 73 97.3

## 2019-11-01 NOTE — ED Provider Notes (Signed)
Received patient in signout from Dr. Preston Fleeting.  86 yo F found unresponsive at her living facility.  Hypotensive, bradycardic hypothermic.  Started on pressors.  Plan for lab evaluation, discussion with family.  Discussed case with family.  Confirmed DNR status.  Ok with pressors fluids and antibiotics initially.    Leukocytosis, hypoglycemia.  Give broad spectrum abx.   I was called to the patient's bedside because she had gone into asystole.  No pulses on exam.  Not breathing.  Time of death 0807.  CRITICAL CARE Performed by: Rae Roam   Total critical care time: 35 minutes  Critical care time was exclusive of separately billable procedures and treating other patients.  Critical care was necessary to treat or prevent imminent or life-threatening deterioration.  Critical care was time spent personally by me on the following activities: development of treatment plan with patient and/or surrogate as well as nursing, discussions with consultants, evaluation of patient's response to treatment, examination of patient, obtaining history from patient or surrogate, ordering and performing treatments and interventions, ordering and review of laboratory studies, ordering and review of radiographic studies, pulse oximetry and re-evaluation of patient's condition.      Melene Plan, DO 2019/10/25 [phone]

## 2019-11-01 NOTE — ED Provider Notes (Addendum)
[facility] EMERGENCY DEPARTMENT Provider Note   CSN: [id number] Arrival date & time: 10/18/19  0630   History Chief Complaint  Patient presents with  . unresponsive    Rachel Rangel is a 86 y.o. female.  The history is provided by the EMS personnel. The history is limited by the condition of the patient (Unresponsive).  She has history of hyperlipidemia, diabetes, dementia and is brought in by ambulance after being found unresponsive at the nursing facility where she resides.  She was last seen at about 4 AM when someone called into the room and she stated that she was watching television.  They found her unresponsive just prior to calling EMS.  EMS relates low blood pressure and have given some IV fluids.  She apparently was her completely normal self last night.  Past Medical History:  Diagnosis Date  . Benign hypertension   . Congestive heart failure (CHF) (HCC)    grade 1 diastolic dysfunction in 11/18  . Dementia (HCC)   . Diabetes mellitus without complication (HCC)   . Edema    Left pedal   . History of complete eye exam 12/25/2012   Normal eye exam, no retinopathy [facility]  . Hyperlipidemia   . Osteoporosis 2010  . Overactive bladder   . Vitamin D deficiency     Patient Active Problem List   Diagnosis Date Noted  . Obesity (BMI 30-39.9) 08/18/2019  . Chronic heart failure with preserved ejection fraction (HCC) 08/18/2019  . Hypoxia   . Acute lower UTI 08/29/2017  . Acute kidney injury superimposed on CKD (HCC)   . Acute on chronic diastolic CHF (congestive heart failure) (HCC)   . COPD exacerbation (HCC) 08/24/2017  . Essential hypertension 05/09/2017  . Dyspnea on exertion 05/08/2017  . Dementia without behavioral disturbance (HCC) 12/12/2015  . T12 compression fracture (HCC) 09/08/2015  . UTI (lower urinary tract infection) 09/07/2015  . Community acquired pneumonia 09/07/2015  . Back pain 09/07/2015  . Fall 09/07/2015  .  Diabetes mellitus type 2, controlled, without complications (HCC) 09/07/2015  . Leukocytosis 09/07/2015  . Acute respiratory failure with hypoxia (HCC) 09/07/2015  . Sepsis (HCC) 09/07/2015    Past Surgical History:  Procedure Laterality Date  . ABDOMINAL HYSTERECTOMY  1975   partial     OB History   No obstetric history on file.     Family History  Problem Relation Age of Onset  . Pancreatic cancer Mother   . Glaucoma Mother   . Hypertension Father   . Hypertension Maternal Grandmother   . Glaucoma Maternal Grandfather   . Hypertension Paternal Grandmother   . Hypertension Paternal Grandfather   . Glaucoma Maternal Aunt   . Glaucoma Maternal Aunt   . Dementia Neg Hx     Social History   Tobacco Use  . Smoking status: Former Smoker    Packs/day: 1.00    Years: 40.00    Pack years: 40.00    Types: Cigarettes    Quit date: 09/30/2014    Years since quitting: 5.0  . Smokeless tobacco: Never Used  Substance Use Topics  . Alcohol use: Not Currently    Alcohol/week: 0.0 standard drinks  . Drug use: No    Home Medications Prior to Admission medications   Medication Sig Start Date End Date Taking? Authorizing Provider  Acetaminophen (APAP 500 PO) Take 500 mg by mouth every 6 (six) hours as needed (pain).     [provider]  amLODipine (NORVASC)  10 MG tablet Take 10 mg by mouth daily. 11/14/15   [provider]  Cholecalciferol (VITAMIN D3) 2000 UNITS capsule Take 2,000 Units by mouth daily.     [provider]  citalopram (CELEXA) 10 MG tablet Take 1 tablet (10 mg total) by mouth daily. 12/25/15   Anson Fret, MD  diclofenac Sodium (VOLTAREN) 1 % GEL 3 (three) times daily as needed. 05/16/19   [provider]  docusate sodium (COLACE) 100 MG capsule Take 1 capsule (100 mg total) by mouth 2 (two) times daily. 08/30/17   Rodolph Bong, MD  donepezil (ARICEPT) 10 MG tablet Take 1 tablet (10 mg total) by mouth at bedtime. Start  with 1/2 tablet for 2 weeks then increase to a whole tablet Patient taking differently: Take 10 mg by mouth at bedtime.  12/25/15   Anson Fret, MD  furosemide (LASIX) 40 MG tablet Take 40 mg by mouth.    [provider]  irbesartan (AVAPRO) 300 MG tablet Take 300 mg by mouth daily.    [provider]  Lido-Capsaicin-Men-Methyl Sal 0.5-0.035-5-20 % PTCH Apply 1 patch topically daily.    [provider]  loratadine (CLARITIN) 10 MG tablet Take 10 mg by mouth daily as needed for allergies.    [provider]  metFORMIN (GLUCOPHAGE) 1000 MG tablet Take 1,000 mg by mouth 2 (two) times a day. 02/08/19   [provider]  miconazole (BAZA ANTIFUNGAL) 2 % cream Apply 1 application topically daily as needed.    [provider]  NYSTATIN powder 2 (two) times daily as needed. 05/16/19   [provider]  omeprazole (PRILOSEC) 20 MG capsule Take 20 mg by mouth daily.    [provider]  ondansetron (ZOFRAN) 4 MG tablet Take 4 mg by mouth every 8 (eight) hours as needed for nausea or vomiting.    [provider]  potassium chloride (K-DUR,KLOR-CON) 10 MEQ tablet Take 10 mEq by mouth daily.    [provider]  rosuvastatin (CRESTOR) 5 MG tablet Take 5 mg by mouth at bedtime.     [provider]  terazosin (HYTRIN) 1 MG capsule Take 1 capsule by mouth daily. 01/16/19   [provider]  traZODone (DESYREL) 50 MG tablet Take 0.5 tablets by mouth at bedtime.  10/22/18   [provider]    Allergies    Lipitor [atorvastatin] and Penicillins  Review of Systems   Review of Systems  Unable to perform ROS: Patient unresponsive    Physical Exam Updated Vital Signs BP (!) 57/31   Pulse (!) 40   Temp (!) 92.7 F (33.7 C) (Rectal)   Resp (!) 22   SpO2 100%   Physical Exam Vitals and nursing note reviewed.   86 year old female who is unresponsive. Vital signs are significant for slow heart  rate, low blood pressure, elevated respiratory rate, low temperature. Oxygen saturation is 100%, which is normal. Head is normocephalic and atraumatic.  Pupils are 2 mm and nonreactive  Oropharynx is clear. Neck is supple without adenopathy or JVD. Lungs are clear without rales, wheezes, or rhonchi. Chest moves symmetrically, no deformity. Heart has an irregular bradycardic rhythm without murmur. Abdomen is soft, flat, nontender without masses or hepatosplenomegaly and peristalsis is normoactive. Extremities have no cyanosis or edema, full range of motion is present. Skin is warm and dry without rash. Neurologic: Unresponsive to verbal and painful stimuli.  Corneal reflex is present but gag reflex is not.  All extremities are flaccid.  ED Results / Procedures / Treatments   Labs (all labs ordered are listed, but only abnormal results are displayed) Labs Reviewed  CBC WITH DIFFERENTIAL/PLATELET - Abnormal; Notable for the following components:      Result Value   WBC 23.0 (*)    RBC 2.35 (*)    Hemoglobin 7.4 (*)    HCT 27.7 (*)    MCV 117.9 (*)    MCHC 26.7 (*)    All other components within normal limits  LACTIC ACID, PLASMA - Abnormal; Notable for the following components:   Lactic Acid, Venous >11.0 (*)    All other components within normal limits  CBG MONITORING, ED - Abnormal; Notable for the following components:   Glucose-Capillary 47 (*)    All other components within normal limits  RESPIRATORY PANEL BY RT PCR (FLU A&B, COVID)  CULTURE, BLOOD (ROUTINE X 2)  CULTURE, BLOOD (ROUTINE X 2)  URINE CULTURE  COMPREHENSIVE METABOLIC PANEL  LACTIC ACID, PLASMA  TSH  T4, FREE  URINALYSIS, ROUTINE W REFLEX MICROSCOPIC  VITAMIN B12  FOLATE  IRON AND TIBC  FERRITIN  RETICULOCYTES  PREPARE RBC (CROSSMATCH)  TROPONIN I (HIGH SENSITIVITY)    EKG EKG Interpretation  Date/Time:  2019/10/25 06:35:42 EST Ventricular Rate:  47 PR Interval:    QRS  Duration: 111 QT Interval:  508 QTC Calculation: 450 R Axis:   -42 Text Interpretation: Bradycardia with irregular rate Abnormal R-wave progression, late transition Left ventricular hypertrophy Rate slower No significant change since last tracing Confirmed by Deno Etienne [id number]) on 10-25-19 7:05:50 AM   Radiology DG Chest Port 1 View  Result Date: 10-25-2019 CLINICAL DATA:  Unresponsive, hypotension EXAM: PORTABLE CHEST 1 VIEW COMPARISON:  07/28/2018 FINDINGS: Mild cardiomegaly. Atherosclerotic calcification. There is no edema, consolidation, effusion, or pneumothorax. Prominent osteopenia. Chronic calcification at the right coracoclavicular interval IMPRESSION: Stable low volume chest. Electronically Signed   By: Monte Fantasia M.D.   On: 10/25/2019 08:01    Procedures .Central Line  Date/Time: 2019/10/25 8:07 AM Performed by: Delora Fuel, MD Authorized by: Delora Fuel, MD   Consent:    Consent obtained:  Emergent situation Pre-procedure details:    Hand hygiene: Hand hygiene performed prior to insertion     Sterile barrier technique: All elements of maximal sterile technique followed     Skin preparation:  2% chlorhexidine   Skin preparation agent: Skin preparation agent completely dried prior to procedure   Anesthesia (see MAR for exact dosages):    Anesthesia method:  None Procedure details:    Location:  R femoral   Site selection rationale:  Ease of access   Patient position:  Flat   Procedural supplies:  Triple lumen   Catheter size:  5.5 Fr   Landmarks identified: yes     Ultrasound guidance: no     Number of attempts:  3   Successful placement: yes   Post-procedure details:    Post-procedure:  Dressing applied and line sutured   Assessment:  Blood return through all ports   Patient tolerance of procedure:  Tolerated well, no immediate complications Comments:     Insertion was technically difficult, guidewire kinked and dilator was not able to be fully threaded.   However, there was good blood return from all 3 ports and it was felt that it was appropriate to use.    CRITICAL CARE Performed by: Delora Fuel Total critical care time: 60 minutes Critical care time was exclusive  of separately billable procedures and treating other patients. Critical care was necessary to treat or prevent imminent or life-threatening deterioration. Critical care was time spent personally by me on the following activities: development of treatment plan with patient and/or surrogate as well as nursing, discussions with consultants, evaluation of patient's response to treatment, examination of patient, obtaining history from patient or surrogate, ordering and performing treatments and interventions, ordering and review of laboratory studies, ordering and review of radiographic studies, pulse oximetry and re-evaluation of patient's condition.  Medications Ordered in ED Medications  norepinephrine (LEVOPHED) 4mg  in premix infusion (15 mcg/min Intravenous Rate/Dose Change 10/12/2019 0709)  vasopressin (PITRESSIN) 40 Units in sodium chloride 0.9 % 250 mL (0.16 Units/mL) infusion (has no administration in time range)  vancomycin (VANCOREADY) IVPB 1500 mg/300 mL (has no administration in time range)  ceFEPIme (MAXIPIME) 2 g in sodium chloride 0.9 % 100 mL IVPB (has no administration in time range)  metroNIDAZOLE (FLAGYL) IVPB 500 mg (has no administration in time range)  dextrose 50 % solution (has no administration in time range)  lactated ringers bolus 1,000 mL (has no administration in time range)    And  lactated ringers bolus 1,000 mL (has no administration in time range)    And  lactated ringers bolus 250 mL (has no administration in time range)  0.9 %  sodium chloride infusion (has no administration in time range)  sodium chloride 0.9 % bolus 1,000 mL (1,000 mLs Intravenous New Bag/Given 10-12-19 0658)  sodium chloride 0.9 % bolus 1,000 mL (1,000 mLs Intravenous New  Bag/Given 10-12-19 0646)  dextrose 50 % solution 50 mL (50 mLs Intravenous Given 12-Oct-2019 0744)    ED Course  I have reviewed the triage vital signs and the nursing notes.  Pertinent labs & imaging results that were available during my care of the patient were reviewed by me and considered in my medical decision making (see chart for details).  MDM Rules/Calculators/A&P Patient arrived unresponsive and found to be hypothermic.  She started on external warming.  She is given aggressive IV fluids and started on pressors.  Right femoral line was inserted to facilitate medications and fluids.  Old records are reviewed, and she had been DNR at last hospital admission in 2019, but no DNR paperwork came with her.  ECG showed sinus bradycardia with atrial bigeminy.  Chest x-ray showed no obvious pneumonia.  Case is signed out to Dr. 2020.   Final Clinical Impression(s) / ED Diagnoses Final diagnoses:  Unresponsive  Other specified hypotension  Hypothermia, initial encounter  Macrocytic anemia    Rx / DC Orders ED Discharge Orders    None       Adela Lank, MD Oct 12, 2019 12/04/19    [id number], MD Oct 12, 2019 [phone]

## 2019-11-01 NOTE — ED Notes (Signed)
CBG 47. Notified RN and MD.

## 2019-11-01 NOTE — ED Notes (Signed)
While scanning patient for medications pt began to brady down and have agonal respirations- MD called to bed side. Pt lost pulses, DNR has been confirmed by daughter who is on the way. Pt allowed to pass peacefully with this RN and EMT present. MD Adela Lank called time of death 0807.

## 2019-11-01 DEATH — deceased

## 2021-06-06 IMAGING — DX DG CHEST 1V PORT
1 series · 1 of 1 positions shown · non-contrast
Comparison: 07/28/2018

CLINICAL DATA: Unresponsive, hypotension

EXAM:
PORTABLE CHEST 1 VIEW

[chest ap]
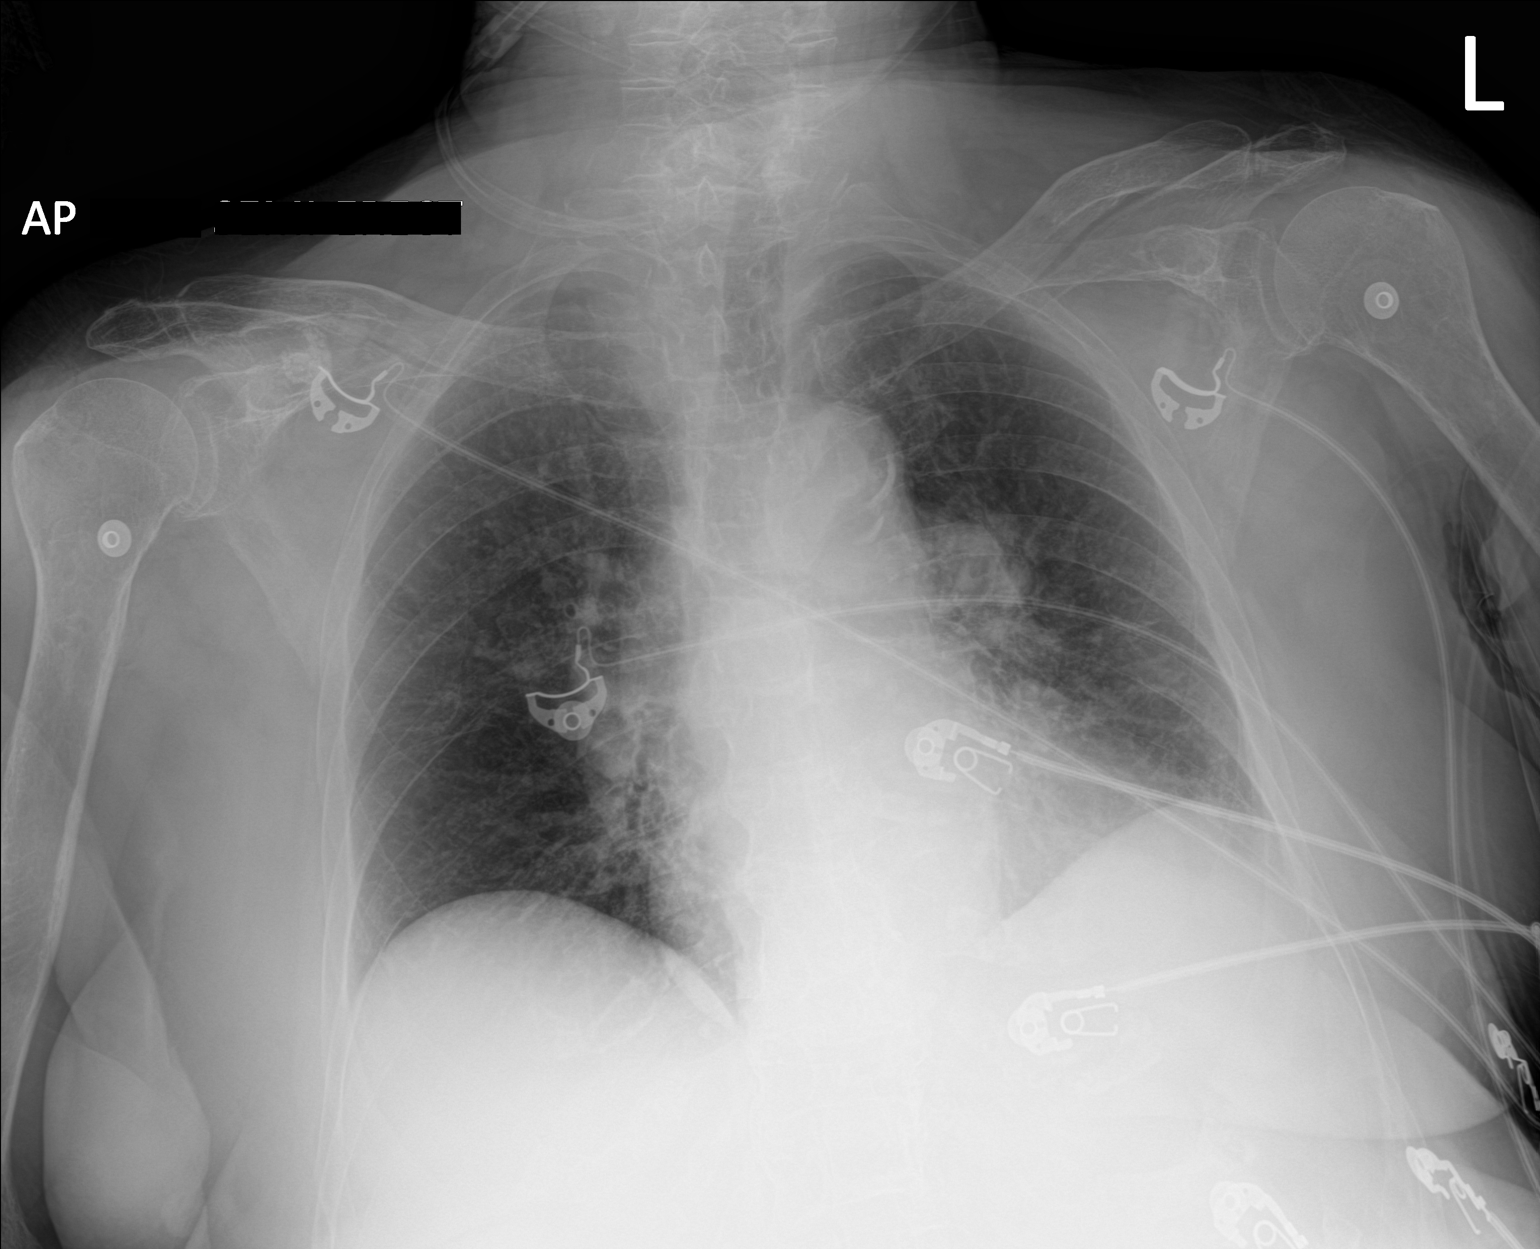

[1 of 1 positions shown; findings below may reference images not displayed]

FINDINGS: Mild cardiomegaly. Atherosclerotic calcification. There is no edema,
consolidation, effusion, or pneumothorax. Prominent osteopenia.
Chronic calcification at the right coracoclavicular interval
IMPRESSION: Stable low volume chest.
